# Patient Record
Sex: Female | Born: 1975 | Race: White | Hispanic: No | Marital: Single | Smoking: Never smoker
Health system: Southern US, Community
[De-identification: ages and names within clinical notes are randomized; demographics above are authoritative.]

## PROBLEM LIST (undated history)

## (undated) DIAGNOSIS — T7840XA Allergy, unspecified, initial encounter: Secondary | ICD-10-CM

## (undated) DIAGNOSIS — I82409 Acute embolism and thrombosis of unspecified deep veins of unspecified lower extremity: Secondary | ICD-10-CM

## (undated) DIAGNOSIS — I739 Peripheral vascular disease, unspecified: Secondary | ICD-10-CM

## (undated) DIAGNOSIS — J45909 Unspecified asthma, uncomplicated: Secondary | ICD-10-CM

## (undated) DIAGNOSIS — J302 Other seasonal allergic rhinitis: Secondary | ICD-10-CM

## (undated) DIAGNOSIS — E669 Obesity, unspecified: Secondary | ICD-10-CM

## (undated) HISTORY — DX: Acute embolism and thrombosis of unspecified deep veins of unspecified lower extremity: I82.409

## (undated) HISTORY — PX: FINGER SURGERY: SHX640

## (undated) HISTORY — PX: ANAL FISTULECTOMY: SHX1139

## (undated) HISTORY — DX: Peripheral vascular disease, unspecified: I73.9

## (undated) HISTORY — DX: Allergy, unspecified, initial encounter: T78.40XA

## (undated) HISTORY — DX: Obesity, unspecified: E66.9

## (undated) HISTORY — DX: Other seasonal allergic rhinitis: J30.2

## (undated) HISTORY — DX: Unspecified asthma, uncomplicated: J45.909

---

## 2004-08-24 HISTORY — PX: TREATMENT FISTULA ANAL: SUR1390

## 2013-06-26 ENCOUNTER — Encounter (INDEPENDENT_AMBULATORY_CARE_PROVIDER_SITE_OTHER): Payer: Self-pay | Admitting: Family Medicine

## 2013-06-26 ENCOUNTER — Ambulatory Visit (INDEPENDENT_AMBULATORY_CARE_PROVIDER_SITE_OTHER): Payer: 59 | Admitting: Family Medicine

## 2013-06-26 VITALS — BP 95/79 | HR 76 | Temp 97.8°F | Resp 14 | Ht 60.39 in | Wt 276.0 lb

## 2013-06-26 MED ORDER — ALBUTEROL SULFATE HFA 108 (90 BASE) MCG/ACT IN AERS
1.0000 | INHALATION_SPRAY | Freq: Four times a day (QID) | RESPIRATORY_TRACT | Status: DC | PRN
Start: 2013-06-26 — End: 2014-11-16

## 2013-06-26 MED ORDER — MONTELUKAST SODIUM 10 MG PO TABS
10.0000 mg | ORAL_TABLET | Freq: Every evening | ORAL | Status: AC
Start: 2013-06-26 — End: ?

## 2013-06-26 NOTE — Progress Notes (Signed)
Has the patient sought any care outside of the [facility]?urgent care   Refer to care team .

## 2013-06-26 NOTE — Progress Notes (Signed)
Subjective:       Patient ID: Jakeira Seeman is a 37 y.o. female.    HPI  New to practice.  Here to get established and for the following concern(s):   Has a myriad of 'quick things' to review (mostly Derm related)  Needs Rx med refills (asthma/allergy).   Right ring finger: several months; growth; ? Wart.  No otc tried.  Bothersome.     The following portions of the patient's history were reviewed and updated as appropriate:  current medications, allergies, past family history, past medical history, past social history, past surgical history and problem list.     Review of Systems   Constitutional: Negative.    HENT: Negative.    Respiratory: Negative.    Skin:        See HPI  No other areas involved         BP 95/79  Pulse 76  Temp 97.8 F (36.6 C) (Oral)  Resp 14  Ht 1.534 m (5' 0.39")  Wt 125.193 kg (276 lb)  BMI 53.20 kg/m2  SpO2 98%  LMP 06/26/2013   Objective:    Physical Exam   Vitals reviewed.  Constitutional: She appears well-developed and well-nourished.        obese   Skin:        Right first finger: few mm raised flesh colored papule           Assessment:       1. Asthma without status asthmaticus, mild intermittent, uncomplicated  albuterol (PROAIR HFA) 108 (90 BASE) MCG/ACT inhaler   2. Seasonal allergic rhinitis  montelukast (SINGULAIR) 10 MG tablet   3. Skin lesion  Ambulatory referral to Dermatology          Plan:       Refilled Rx meds as above  See DERM  rto for CPE/FBW/well female exam   Call with updates/questions/concerns.    Madlyn Frankel Levetta Bognar, D.O.

## 2013-07-04 ENCOUNTER — Encounter (INDEPENDENT_AMBULATORY_CARE_PROVIDER_SITE_OTHER): Payer: Self-pay

## 2014-02-15 ENCOUNTER — Encounter (INDEPENDENT_AMBULATORY_CARE_PROVIDER_SITE_OTHER): Payer: Self-pay | Admitting: Family Medicine

## 2014-11-16 ENCOUNTER — Ambulatory Visit (INDEPENDENT_AMBULATORY_CARE_PROVIDER_SITE_OTHER): Payer: 59 | Admitting: Family Medicine

## 2014-11-16 ENCOUNTER — Other Ambulatory Visit: Payer: 59

## 2014-11-16 ENCOUNTER — Encounter (INDEPENDENT_AMBULATORY_CARE_PROVIDER_SITE_OTHER): Payer: Self-pay | Admitting: Family Medicine

## 2014-11-16 VITALS — BP 135/81 | HR 75 | Temp 98.1°F | Resp 24 | Ht 60.43 in | Wt 280.4 lb

## 2014-11-16 DIAGNOSIS — Z833 Family history of diabetes mellitus: Secondary | ICD-10-CM

## 2014-11-16 DIAGNOSIS — Z0000 Encounter for general adult medical examination without abnormal findings: Secondary | ICD-10-CM

## 2014-11-16 DIAGNOSIS — Z1389 Encounter for screening for other disorder: Secondary | ICD-10-CM

## 2014-11-16 DIAGNOSIS — Z1331 Encounter for screening for depression: Secondary | ICD-10-CM

## 2014-11-16 DIAGNOSIS — J4520 Mild intermittent asthma, uncomplicated: Secondary | ICD-10-CM

## 2014-11-16 MED ORDER — ALBUTEROL SULFATE HFA 108 (90 BASE) MCG/ACT IN AERS
1.0000 | INHALATION_SPRAY | Freq: Four times a day (QID) | RESPIRATORY_TRACT | Status: AC | PRN
Start: 2014-11-16 — End: ?

## 2014-11-16 NOTE — Progress Notes (Signed)
Subjective:      Patient ID: Jenna Craig  is a 39 y.o.  female.     Chief Complaint   Patient presents with   . Annual Exam      HPI   Last seen 06/2013.  Here for complete physical examination with fasting blood work.  See ROS for any specific concerns today.      Patient Active Problem List   Diagnosis   . Obesity   . Asthma without status asthmaticus   . Seasonal allergic rhinitis     Past Medical History   Diagnosis Date   . Obesity    . Asthma without status asthmaticus    . Seasonal allergic rhinitis      Past Surgical History   Procedure Laterality Date   . Treatment fistula anal  2006   . Finger surgery       at age 79      Family History   Problem Relation Age of Onset   . Diabetes Mother    . Hypertension Mother    . Cancer Father 16     squamous cell CA   . Clotting disorder Brother    . Cancer Maternal Aunt 53     breast cancer      History     Social History   . Marital Status: Single     Spouse Name: N/A   . Number of Children: N/A   . Years of Education: N/A     Occupational History   . Not on file.     Social History Main Topics   . Smoking status: Never Smoker    . Smokeless tobacco: Not on file   . Alcohol Use: Not on file   . Drug Use: Not on file   . Sexual Activity: Yes     Birth Control/ Protection: Condom     Other Topics Concern   . Not on file     Social History Narrative     Current Outpatient Prescriptions   Medication Sig Dispense Refill   . albuterol (PROAIR HFA) 108 (90 BASE) MCG/ACT inhaler Inhale 1-2 puffs into the lungs every 6 (six) hours as needed. 18 g 5   . ibuprofen (ADVIL,MOTRIN) 800 MG tablet Take 800 mg by mouth every 6 (six) hours as needed.     . montelukast (SINGULAIR) 10 MG tablet Take 1 tablet (10 mg total) by mouth nightly. 30 tablet 11     No current facility-administered medications for this visit.     No Known Allergies      Review of Systems   Constitutional: Negative for fever, fatigue and unexpected weight change.   HENT: Negative for congestion, hearing loss,  sore throat and tinnitus.    Eyes: Negative for redness and visual disturbance.   Respiratory: Negative for cough and shortness of breath.         Hx asthma; rare use albuterol; needs refill   Cardiovascular: Negative for chest pain, palpitations and leg swelling.   Gastrointestinal: Negative for nausea, vomiting, abdominal pain, diarrhea, constipation and blood in stool.   Genitourinary: Negative for dysuria, frequency and difficulty urinating.   Musculoskeletal: Negative for myalgias and arthralgias.        Occas nighttime leg cramps   Skin: Negative for rash.   Neurological: Negative for dizziness, weakness, numbness and headaches.   Hematological: Negative for adenopathy. Does not bruise/bleed easily.   Psychiatric/Behavioral: Negative for sleep disturbance and dysphoric mood. The patient is not  nervous/anxious.          BP 135/81 mmHg  Pulse 75  Temp(Src) 98.1 F (36.7 C) (Oral)  Resp 24  Ht 1.535 m (5' 0.43")  Wt 127.189 kg (280 lb 6.4 oz)  BMI 53.98 kg/m2  SpO2 94%    Objective:   Physical Exam   Constitutional: She appears well-developed and well-nourished.   obese   HENT:   Right Ear: Tympanic membrane and ear canal normal.   Left Ear: Tympanic membrane and ear canal normal.   Nose: Nose normal.   Mouth/Throat: Oropharynx is clear and moist.   Eyes: Conjunctivae and EOM are normal.   Neck: Neck supple. No thyroid mass and no thyromegaly present.   Cardiovascular: Regular rhythm and normal heart sounds.    Few lower leg/ankle varicosities   Pulmonary/Chest: Breath sounds normal.   Abdominal: Soft. Normal appearance and bowel sounds are normal. There is no hepatosplenomegaly. There is no tenderness.   Musculoskeletal:   Normal ROM.  No tenderness, swelling.    Lymphadenopathy:     She has no cervical adenopathy.   Neurological: She has normal strength. No cranial nerve deficit or sensory deficit.   Skin: No rash noted.   Psychiatric: She has a normal mood and affect.        Assessment:     1. Routine  general medical examination at a health care facility  CBC without differential    Comprehensive metabolic panel    Lipid panel    TSH    Vitamin D,25 OH, Total   2. Asthma without status asthmaticus, mild intermittent, uncomplicated  albuterol (PROAIR HFA) 108 (90 BASE) MCG/ACT inhaler   3. Depression screening     4. Family history of diabetes mellitus (DM)  Hemoglobin A1C   5. Morbid obesity due to excess calories           Plan:   Fasting blood work as above.  DDS up to date; Ophtho due.  Sees DERM regularly.  Sees GYN; utd    Body mass index is 53.98 kg/(m^2).   Discussed the patient's BMI with her.    The BMI is above average; BMI management plan is completed.    Nutrition Counseling: Low cholesterol diet education  Physical Activity Counseling: Patient advised about exercise     Counseling/Anticipatory Guidance (40-64): Nutrition, physical activity, healthy weight, injury prevention, misuse of tobacco, alcohol and drugs, sexual behavior and STDs, contraception, dental health, mental health, immunizations, screenings.      Call with updates/questions/concerns.    Madlyn Frankel Fawn Desrocher, D.O.

## 2014-11-16 NOTE — Progress Notes (Signed)
Has the patient sought any care outside of the [facility]? Yes - Dermatologist; [facility] (Lumbar Ridiculopathy)  Refer to Care Team? No    Pap Smear due. Pt notified.

## 2014-11-22 LAB — COMPREHENSIVE METABOLIC PANEL
ALT: 25 IU/L (ref 0–32)
AST (SGOT): 16 IU/L (ref 0–40)
Albumin/Globulin Ratio: 1.6 (ref 1.1–2.5)
Albumin: 4.3 g/dL (ref 3.5–5.5)
Alkaline Phosphatase: 49 IU/L (ref 39–117)
BUN / Creatinine Ratio: 19 (ref 9–23)
BUN: 13 mg/dL (ref 6–24)
Bilirubin, Total: 0.4 mg/dL (ref 0.0–1.2)
CO2: 26 mmol/L (ref 18–29)
Calcium: 9.1 mg/dL (ref 8.7–10.2)
Chloride: 99 mmol/L (ref 97–108)
Creatinine: 0.68 mg/dL (ref 0.57–1.00)
EGFR: 109 mL/min/{1.73_m2} (ref >59)
EGFR: 126 mL/min/{1.73_m2} (ref >59)
Globulin, Total: 2.7 g/dL (ref 1.5–4.5)
Glucose: 112 mg/dL — ABNORMAL HIGH (ref 65–99)
Potassium: 4.1 mmol/L (ref 3.5–5.2)
Protein, Total: 7 g/dL (ref 6.0–8.5)
Sodium: 139 mmol/L (ref 134–144)

## 2014-11-22 LAB — LIPID PANEL
Cholesterol / HDL Ratio: 4.3 ratio units (ref 0.0–4.4)
Cholesterol: 175 mg/dL (ref 100–199)
HDL: 41 mg/dL (ref >39)
LDL Calculated: 119 mg/dL — ABNORMAL HIGH (ref 0–99)
Triglycerides: 74 mg/dL (ref 0–149)
VLDL Calculated: 15 mg/dL (ref 5–40)

## 2014-11-22 LAB — CBC

## 2014-11-22 LAB — TSH: TSH: 3.63 u[IU]/mL (ref 0.450–4.500)

## 2014-11-23 LAB — VITAMIN D,25 OH,TOTAL: Vitamin D 25-Hydroxy: 15.9 ng/mL — ABNORMAL LOW (ref 30.0–100.0)

## 2014-11-24 LAB — HEMOGLOBIN A1C: Hemoglobin A1C: 6.4 % — AB (ref 4.0–6.0)

## 2014-11-27 ENCOUNTER — Other Ambulatory Visit (INDEPENDENT_AMBULATORY_CARE_PROVIDER_SITE_OTHER): Payer: Self-pay | Admitting: Family Medicine

## 2014-11-27 DIAGNOSIS — R7303 Prediabetes: Secondary | ICD-10-CM | POA: Insufficient documentation

## 2014-11-27 DIAGNOSIS — R748 Abnormal levels of other serum enzymes: Secondary | ICD-10-CM | POA: Insufficient documentation

## 2014-11-27 DIAGNOSIS — E559 Vitamin D deficiency, unspecified: Secondary | ICD-10-CM | POA: Insufficient documentation

## 2014-11-27 NOTE — Progress Notes (Signed)
Quick Note:    Call pt. We finally received all lab results.  Sugars are elevated/borderline diabetes.  Good cholesterol is low.   Vitamin D is low.   Start otc vit D supplement 2000 units/day. Work on diet/exercise/weight loss. May need Rx med for sugars.   Recheck FBW in 4 months.         ______

## 2014-11-29 ENCOUNTER — Encounter (INDEPENDENT_AMBULATORY_CARE_PROVIDER_SITE_OTHER): Payer: Self-pay

## 2014-11-29 NOTE — Progress Notes (Signed)
As per Dr. Franklyn Lor. Enter/Edit the A1C to his original order.

## 2014-12-28 ENCOUNTER — Telehealth (INDEPENDENT_AMBULATORY_CARE_PROVIDER_SITE_OTHER): Payer: Self-pay

## 2014-12-28 NOTE — Telephone Encounter (Signed)
Pt LM on Nurse's VM worried she was experiencing symptoms of a heart attack.    Are any of the following symptoms present?     -Continuous or intermittent pain, tightness, pressure or discomfort accompanied by:   -SOB  -Dizziness, weakness, passed out or feels about to pass out   -Moist skin (sweating)   -Nausea or vomiting   -Pain in the neck, shoulders, jaw, back or arms   -Blue or gray face, lips, earlobes, or fingernails   -Heart palpitation     -Chest pain persists, unrelieved by rest, pain medication, antacids, or nitroglycerin every 5 minutes for 2-3 doses?     If yes, call ambulance and after calling 911 chew one adult aspirin (325mg ) unless allergic to aspirin     Are any of the following symptoms present?     -Change in chest pain in known cardiac patient   -Intermittent chest pain increasing in severity or frequency   -Chest pain at rest or that awakens person  -Chest pain after recent period of prolonged sitting, bed rest, or travel   -Strong family history of heart disease, heart attack, stroke, or DM   -History of DM, heart disease, CHF, of blood clotting problem (DVT, PE)   -Age >30 and heavy smoker with high blood pressure, high cholesterol or obesity   -Pain, swelling, warmth, or redness of leg   -Sudden onset of swollen ankles   -Coughing up blood   -Fever, cough, congestion and SOB  -Trauma, fracture childbirth or surgery in past month   -History of blood clotting problems   -Recreational street drug or prescription drug abuse within past 24 hours   -Heart palpitations   -Repeated shocks and internal defibrillator in place.     If yes, seek emergency care now. Do not drive yourself. If another driver is not available, call an ambulance.     If no, are any of the following symptoms present?     -Localized area of painful blisters or rash   -Recent injury and pain increases with movement   -Pain occurs when pressure is applied to the area   -Intermittent mild chest discomfort with productive  cough  -Fever >100.4  All other patient with chest pain??    If yes see patient in the office that day or advice the patient to go to UC.     Called pt back and asked her the questions above. She stated that she had Right arm pain yesterday from her elbow to her shoulder. That pain went away, but then today she has a sharp pain through her Right breast to her back. The pain lasted approx 3 hours and she advised she took advil and the pain has subsided. The only symptom she had was the sharp pain through her Right breast, which is now gone. I suggested that if her pain comes back during the weekend, she could visit an [place] [facility]. Or if it comes back next week, to call our office and make an OV for evaluation. Pt verbalized understanding and had no further questions.

## 2015-03-29 ENCOUNTER — Encounter (INDEPENDENT_AMBULATORY_CARE_PROVIDER_SITE_OTHER): Payer: Self-pay | Admitting: Family Medicine

## 2015-03-29 DIAGNOSIS — E559 Vitamin D deficiency, unspecified: Secondary | ICD-10-CM

## 2015-03-29 DIAGNOSIS — R7303 Prediabetes: Secondary | ICD-10-CM

## 2016-12-28 ENCOUNTER — Encounter: Payer: Self-pay | Admitting: Obstetrics & Gynecology

## 2017-02-16 ENCOUNTER — Ambulatory Visit: Payer: Self-pay | Admitting: *Deleted

## 2017-02-16 ENCOUNTER — Encounter: Payer: Self-pay | Admitting: *Deleted

## 2017-02-16 VITALS — BP 118/86 | Ht 61.0 in | Wt 283.0 lb

## 2017-02-16 DIAGNOSIS — Z0000 Encounter for general adult medical examination without abnormal findings: Secondary | ICD-10-CM

## 2017-02-16 NOTE — Progress Notes (Signed)
[employer] insurance premium discount evaluation: Labs Drawn. Replacements ROI form signed. Tobacco Free Attestation form signed.  Forms placed in paper chart.  

## 2017-02-17 LAB — CMP12+LP+TP+TSH+6AC+CBC/D/PLT
A/G RATIO: 1.3 (ref 1.2–2.2)
ALT: 20 IU/L (ref 0–32)
AST: 11 IU/L (ref 0–40)
Albumin: 4 g/dL (ref 3.5–5.5)
Alkaline Phosphatase: 52 IU/L (ref 39–117)
BASOS: 0 %
BUN / CREAT RATIO: 19 (ref 9–23)
BUN: 14 mg/dL (ref 6–24)
Basophils Absolute: 0 10*3/uL (ref 0.0–0.2)
Bilirubin Total: 0.3 mg/dL (ref 0.0–1.2)
CHLORIDE: 103 mmol/L (ref 96–106)
CHOL/HDL RATIO: 3.4 ratio (ref 0.0–4.4)
CREATININE: 0.73 mg/dL (ref 0.57–1.00)
Calcium: 9.1 mg/dL (ref 8.7–10.2)
Cholesterol, Total: 154 mg/dL (ref 100–199)
EOS (ABSOLUTE): 0.3 10*3/uL (ref 0.0–0.4)
EOS: 4 %
Estimated CHD Risk: 0.5 times avg. (ref 0.0–1.0)
Free Thyroxine Index: 1.8 (ref 1.2–4.9)
GFR, EST AFRICAN AMERICAN: 117 mL/min/{1.73_m2} (ref >59)
GFR, EST NON AFRICAN AMERICAN: 101 mL/min/{1.73_m2} (ref >59)
GGT: 18 IU/L (ref 0–60)
GLUCOSE: 123 mg/dL — AB (ref 65–99)
Globulin, Total: 3 g/dL (ref 1.5–4.5)
HDL: 45 mg/dL (ref >39)
HEMOGLOBIN: 13.5 g/dL (ref 11.1–15.9)
Hematocrit: 41.2 % (ref 34.0–46.6)
Immature Grans (Abs): 0 10*3/uL (ref 0.0–0.1)
Immature Granulocytes: 0 %
Iron: 58 ug/dL (ref 27–159)
LDH: 162 IU/L (ref 119–226)
LDL Calculated: 97 mg/dL (ref 0–99)
Lymphocytes Absolute: 2.3 10*3/uL (ref 0.7–3.1)
Lymphs: 28 %
MCH: 28.8 pg (ref 26.6–33.0)
MCHC: 32.8 g/dL (ref 31.5–35.7)
MCV: 88 fL (ref 79–97)
MONOCYTES: 9 %
Monocytes Absolute: 0.7 10*3/uL (ref 0.1–0.9)
NEUTROS ABS: 4.7 10*3/uL (ref 1.4–7.0)
Neutrophils: 59 %
PHOSPHORUS: 3.5 mg/dL (ref 2.5–4.5)
POTASSIUM: 4.7 mmol/L (ref 3.5–5.2)
Platelets: 214 10*3/uL (ref 150–379)
RBC: 4.68 x10E6/uL (ref 3.77–5.28)
RDW: 13.9 % (ref 12.3–15.4)
Sodium: 141 mmol/L (ref 134–144)
T3 Uptake Ratio: 26 % (ref 24–39)
T4, Total: 6.8 ug/dL (ref 4.5–12.0)
TSH: 3.77 u[IU]/mL (ref 0.450–4.500)
Total Protein: 7 g/dL (ref 6.0–8.5)
Triglycerides: 60 mg/dL (ref 0–149)
URIC ACID: 5.6 mg/dL (ref 2.5–7.1)
VLDL CHOLESTEROL CAL: 12 mg/dL (ref 5–40)
WBC: 8 10*3/uL (ref 3.4–10.8)

## 2017-02-17 LAB — HGB A1C W/O EAG: HEMOGLOBIN A1C: 6.6 % — AB (ref 4.8–5.6)

## 2017-02-18 NOTE — Progress Notes (Signed)
Pt off work today. Unable to review labs.  New employee. No previous labs available. Recently moved to area. No CareEverywhere available. A1c elevated. Pt denied medical Hx other than seasonal allergies and rectal fistula repair. Handouts prepared for prediabetes, diabetic diet recommendations and local PCP accepting new pts. Will review will pt upon RN return in 2 weeks, or NP in clinic next week if pt available.

## 2017-02-23 ENCOUNTER — Encounter: Payer: Self-pay | Admitting: Obstetrics & Gynecology

## 2017-03-02 NOTE — Progress Notes (Signed)
Pt's phone extension reports "at home" 6/28, 7/9, and 7/10 when RN has attempted result review. Will attempt contact again Thursday upon RN return.

## 2017-03-05 NOTE — Progress Notes (Signed)
Results reviewed with pt. A1c and glucose elevated. She reports having been told in the past that was prediabetic. Diet worsened since moving to [place], she reports due to change in schedule and habits. Sts she does not exercise and struggles to drink more than 32oz of water. Sts mother is diabetic. Reviewed diet and exercise recommendations r/t diabetes at length, and gave handouts from [facility] on carb counting, the plate method and general prediabetic/diabetes information. Other labs unremarkable. Pt also given list of local PCPs accepting new pts to establish care with. Will inform RN once care is established in order to send records to new provider. Copy of labs provided to pt. She denies further questions or concerns.

## 2017-06-15 ENCOUNTER — Encounter: Payer: Self-pay | Admitting: Registered Nurse

## 2017-06-15 ENCOUNTER — Ambulatory Visit: Payer: Self-pay | Admitting: Registered Nurse

## 2017-06-15 VITALS — BP 144/98 | HR 84 | Temp 98.5°F | Resp 16

## 2017-06-15 DIAGNOSIS — W57XXXA Bitten or stung by nonvenomous insect and other nonvenomous arthropods, initial encounter: Secondary | ICD-10-CM

## 2017-06-15 DIAGNOSIS — R300 Dysuria: Secondary | ICD-10-CM

## 2017-06-15 LAB — POCT URINALYSIS DIPSTICK
Bilirubin, UA: NEGATIVE
Blood, UA: NEGATIVE
Glucose, UA: NEGATIVE
KETONES UA: NEGATIVE
Leukocytes, UA: NEGATIVE
Nitrite, UA: NEGATIVE
PH UA: 6.5 (ref 5.0–8.0)
PROTEIN UA: NEGATIVE
SPEC GRAV UA: 1.02 (ref 1.010–1.025)
Urobilinogen, UA: 0.2 E.U./dL

## 2017-06-15 MED ORDER — HYDROCORTISONE 1 % EX LOTN: 1.0000 "application " | TOPICAL_LOTION | Freq: Two times a day (BID) | CUTANEOUS | 0 refills | Status: AC | PRN

## 2017-06-15 MED ORDER — TRIPLE ANTIBIOTIC 5-400-5000 EX OINT: TOPICAL_OINTMENT | Freq: Two times a day (BID) | CUTANEOUS | 0 refills | Status: AC | PRN

## 2017-06-15 NOTE — Progress Notes (Signed)
Subjective:    Patient ID: Danielle Cooke, female    DOB: 07/09/1976, 41 y.o.   MRN: [id number]  41y/o caucasian female new patient c/o bug bites left thigh.  Initially red/swollen looked like 3 spots close together.  Denied drainage, fever, itching.   Also c/o uti sx, urgency, frequency, very small amount of urine output x3 days. Denied hematuria, cloudiness, smell, changes in color.  Typically feels better on her period.  Patient stopped drinking coca cola every day after getting her lab results [facility] and elevated blood sugar down to one soda per week now and trying to lose weight.  Has been drinking more milk typically does not drink water  PMHx chronic low back pain denied loss of bowel/bladder control, saddle paresthesias, arm/leg weakness.  Having low back pain today about her usual maybe a little worse.  Had some diarrhea in the past 2 days I think I ate something that did not agree with me      Review of Systems  Constitutional: Negative for activity change, appetite change, chills, diaphoresis, fatigue, fever and unexpected weight change.  HENT: Negative for trouble swallowing and voice change.   Eyes: Negative for photophobia and visual disturbance.  Respiratory: Negative for shortness of breath and wheezing.   Cardiovascular: Negative for palpitations and leg swelling.  Gastrointestinal: Positive for abdominal pain and diarrhea. Negative for abdominal distention, blood in stool, constipation, nausea and vomiting.  Endocrine: Negative for cold intolerance and heat intolerance.  Genitourinary: Positive for decreased urine volume, dysuria and urgency. Negative for hematuria and menstrual problem.  Musculoskeletal: Positive for back pain. Negative for gait problem, joint swelling, myalgias, neck pain and neck stiffness.  Skin: Positive for color change and rash. Negative for pallor and wound.  Allergic/Immunologic: Negative for food allergies.  Neurological: Negative for tremors,  seizures, syncope, facial asymmetry, speech difficulty, weakness, light-headedness, numbness and headaches.  Hematological: Negative for adenopathy. Does not bruise/bleed easily.  Psychiatric/Behavioral: Negative for agitation, confusion and sleep disturbance.       Objective:   Physical Exam  Constitutional: She is oriented to person, place, and time. She appears well-developed and well-nourished. She is active and cooperative.  Non-toxic appearance. She does not have a sickly appearance. She does not appear ill. No distress.  HENT:  Head: Normocephalic and atraumatic.  Right Ear: Hearing, external ear and ear canal normal. A middle ear effusion is present.  Left Ear: Hearing, external ear and ear canal normal. A middle ear effusion is present.  Nose: Nose normal. No mucosal edema, rhinorrhea, nose lacerations, sinus tenderness, nasal deformity, septal deviation or nasal septal hematoma. No epistaxis.  No foreign bodies. Right sinus exhibits no maxillary sinus tenderness and no frontal sinus tenderness. Left sinus exhibits no maxillary sinus tenderness and no frontal sinus tenderness.  Mouth/Throat: Uvula is midline. Mucous membranes are not pale, dry and not cyanotic. She does not have dentures. No oral lesions. No trismus in the jaw. Normal dentition. No dental abscesses, uvula swelling, lacerations or dental caries. No oropharyngeal exudate, posterior oropharyngeal edema, posterior oropharyngeal erythema or tonsillar abscesses.  Bilateral TMs air fluid level clear; saliva tacky/slight coating tongue  Eyes: Pupils are equal, round, and reactive to light. Conjunctivae, EOM and lids are normal. Right eye exhibits no chemosis, no discharge, no exudate and no hordeolum. No foreign body present in the right eye. Left eye exhibits no chemosis, no discharge, no exudate and no hordeolum. No foreign body present in the left eye. Right conjunctiva is not  injected. Right conjunctiva has no hemorrhage. Left  conjunctiva is not injected. Left conjunctiva has no hemorrhage. No scleral icterus. Right eye exhibits normal extraocular motion and no nystagmus. Left eye exhibits normal extraocular motion and no nystagmus. Right pupil is round and reactive. Left pupil is round and reactive. Pupils are equal.  Neck: Trachea normal, normal range of motion and phonation normal. Neck supple. No tracheal tenderness, no spinous process tenderness and no muscular tenderness present. No neck rigidity. No tracheal deviation, no edema, no erythema and normal range of motion present. No thyroid mass and no thyromegaly present.  Cardiovascular: Normal rate, regular rhythm, S1 normal, S2 normal, normal heart sounds and intact distal pulses.  PMI is not displaced.  Exam reveals no gallop and no friction rub.   No murmur heard. Pulses:      Radial pulses are 2+ on the right side, and 2+ on the left side.  Pulmonary/Chest: Effort normal and breath sounds normal. No accessory muscle usage or stridor. No respiratory distress. She has no decreased breath sounds. She has no wheezes. She has no rhonchi. She has no rales. She exhibits no tenderness.  No cough observed in exam room; spoke full sentences without difficulty  Abdominal: Soft. Normal appearance. She exhibits no shifting dullness, no distension, no pulsatile liver, no fluid wave, no abdominal bruit, no ascites, no pulsatile midline mass and no mass. Bowel sounds are decreased. There is tenderness in the right lower quadrant, suprapubic area and left lower quadrant. There is no rigidity, no rebound, no guarding, no CVA tenderness, no tenderness at McBurney's point and negative Murphy's sign. Hernia confirmed negative in the ventral area.  Dull to percussion x 4 quads; hypoactive bowel sounds x 4 quads; negative CVA tenderness bilaterally  Musculoskeletal: Normal range of motion. She exhibits no edema or tenderness.       Right shoulder: Normal.       Left shoulder: Normal.        Right elbow: Normal.      Left elbow: Normal.       Right hip: Normal.       Left hip: Normal.       Right knee: Normal.       Left knee: Normal.       Cervical back: Normal.       Thoracic back: Normal.       Back:       Right hand: Normal.       Left hand: Normal.  Midline low back pain with lying to sitting and standing; slow from lying to sitting did not require assistance; in/out of chair without difficulty; gait sure and steady in hall  Lymphadenopathy:       Head (right side): No submental, no submandibular, no tonsillar, no preauricular, no posterior auricular and no occipital adenopathy present.       Head (left side): No submental, no submandibular, no tonsillar, no preauricular, no posterior auricular and no occipital adenopathy present.    She has no cervical adenopathy.       Right cervical: No superficial cervical, no deep cervical and no posterior cervical adenopathy present.      Left cervical: No superficial cervical, no deep cervical and no posterior cervical adenopathy present.  Neurological: She is alert and oriented to person, place, and time. She has normal strength. She is not disoriented. She displays no atrophy and no tremor. No cranial nerve deficit or sensory deficit. She exhibits normal muscle tone. She displays no seizure  activity. Coordination and gait normal. GCS eye subscore is 4. GCS verbal subscore is 5. GCS motor subscore is 6.  Extremity strength equal bilaterally 5/5  Skin: Skin is warm, dry and intact. Rash noted. No abrasion, no bruising, no burn, no ecchymosis, no laceration, no lesion, no petechiae and no purpura noted. Rash is macular, papular and maculopapular. Rash is not nodular, not pustular, not vesicular and not urticarial. She is not diaphoretic. No cyanosis or erythema. No pallor. Nails show no clubbing.     3 papules grouped left upper anterior thigh less than 1 cm diameter; faint macular erythema and fine scale/hyperpigmentation noted not  TTP; area dry  Psychiatric: She has a normal mood and affect. Her speech is normal and behavior is normal. Judgment and thought content normal. Cognition and memory are normal.  Nursing note and vitals reviewed.         Assessment & Plan:  A-insect bite initial encounter no sequela; dysuria  P-May apply triple antibiotic UD 3 given to patient from clinic stock topical BID affected area until healed.  If itching may apply hydrocortisone lotion 1% BID x 7 days 2 UD given to patient from clinic stock. Avoid scratching.  Discussed calamine lotion or topical benadryl or ice also helpful for itching. Symptomatic therapy suggested.  Call or return to clinic as needed if these symptoms worsen or fail to improve as anticipated.  Go to ER if difficulty breathing, swallowing, dizziness or chest pain for immediate evaluation and treatment follow up with [facility] if ER visit required.   Discussed signs/symptoms infection e.g. Fever, chills, worsening redness, swelling and/or discharge.  Exitcare handout on insect bite/sting, cellulitis given to patient.  Patient verbalized agreement and understanding of treatment plan and had no further questions at this time.   P2:  Avoidance and hand washing.  Patient is also to push fluids alternate milk and water to improve hydration status.  Drink full glass of water with biotin supplement.   Hydrate, avoid dehydration.  Avoid holding urine, void on frequent basis every 4 to 6 hours.  If unable to void every 8 hours, hematuria, nausea, vomiting, tea/cola colored urine follow up for re-evaluation with PCM, urgent care or ER.   Call or return to clinic as needed if these symptoms worsen or fail to improve as anticipated. Patient to return to clinic if still dysuria after increasing hydration for repeat ua in 48 hours.  Discussed mechanical irritation, foods/vitamins/supplements, infection, kidney stones, crystals in urine, sugar in urine could cause dysuria/pain.  Urinalysis today  WNL and patient notified at appt verbally and voiced understanding of information.  Exitcare handout on dysuria given to patient Patient verbalized agreement and understanding of treatment plan and had no further questions at this time. P2:  Hydrate and cranberry juice

## 2017-06-15 NOTE — Patient Instructions (Signed)
Insect Bite, Adult An insect bite can make your skin red, itchy, and swollen. An insect bite is different from an insect sting, which happens when an insect injects poison (venom) into the skin. Some insects can spread disease to people through a bite. However, most insect bites do not lead to disease and are not serious. What are the causes? Insects may bite for a variety of reasons, including:  Hunger.  To defend themselves.  Insects that bite include:  Spiders.  Mosquitoes.  Ticks.  Fleas.  Ants.  Flies.  Bedbugs.  What are the signs or symptoms? Symptoms of this condition include:  Itching or pain in the bite area.  Redness and swelling in the bite area.  An open wound (skin ulcer).  In many cases, symptoms last for 2-4 days. How is this diagnosed? This condition is usually diagnosed based on symptoms and a physical exam. How is this treated? Treatment is usually not needed. Symptoms often go away on their own. When treatment is recommended, it may involve:  Applying a cream or lotion to the bitten area. This treatment helps with itching.  Taking an antibiotic medicine. This treatment is needed if the bite area gets infected.  Getting a tetanus shot.  Applying ice to the affected area.  Medicines called antihistamines. This treatment is needed if you develop an allergic reaction to the insect bite.  Follow these instructions at home: Bite area care  Do not scratch the bite area.  Keep the bite area clean and dry. Wash it every day with soap and water as told by your health care provider.  Check the bite area every day for signs of infection. Check for: ? More redness, swelling, or pain. ? Fluid or blood. ? Warmth. ? Pus. Managing pain, itching, and swelling   You may apply a baking soda paste, cortisone cream, or calamine lotion to the bite area as told by your health care provider.  If directed, applyice to the bite area. ? Put ice in a  plastic bag. ? Place a towel between your skin and the bag. ? Leave the ice on for 20 minutes, 2-3 times per day. Medicines  Apply or take over-the-counter and prescription medicines only as told by your health care provider.  If you were prescribed an antibiotic medicine, use it as told by your health care provider. Do not stop using the antibiotic even if your condition improves. General instructions  Keep all follow-up visits as told by your health care provider. This is important. How is this prevented? To help reduce your risk of insect bites:  When you are outdoors, wear clothing that covers your arms and legs.  Use insect repellent. The best insect repellents contain: ? DEET, picaridin, oil of lemon eucalyptus (OLE), or IR3535. ? Higher amounts of an active ingredient.  If your home windows do not have screens, consider installing them.  Contact a health care provider if:  You have more redness, swelling, or pain in the bite area.  You have fluid, blood, or pus coming from the bite area.  The bite area feels warm to the touch.  You have a fever. Get help right away if:  You have joint pain.  You have a rash.  You have shortness of breath.  You feel unusually tired or sleepy.  You have neck pain.  You have a headache.  You have unusual weakness.  You have chest pain.  You have nausea, vomiting, or pain in the abdomen. This  information is not intended to replace advice given to you by your health care provider. Make sure you discuss any questions you have with your health care provider. Document Released: 09/17/2004 Document Revised: 04/08/2016 Document Reviewed: 02/17/2016 Elsevier Interactive Patient Education  2018 [employer] Dysuria Dysuria is pain or discomfort while urinating. The pain or discomfort may be felt in the tube that carries urine out of the bladder (urethra) or in the surrounding tissue of the genitals. The pain may also be felt in the  groin area, lower abdomen, and lower back. You may have to urinate frequently or have the sudden feeling that you have to urinate (urgency). Dysuria can affect both men and women, but is more common in women. Dysuria can be caused by many different things, including:  Urinary tract infection in women.  Infection of the kidney or bladder.  Kidney stones or bladder stones.  Certain sexually transmitted infections (STIs), such as chlamydia.  Dehydration.  Inflammation of the vagina.  Use of certain medicines.  Use of certain soaps or scented products that cause irritation.  Follow these instructions at home: Watch your dysuria for any changes. The following actions may help to reduce any discomfort you are feeling:  Drink enough fluid to keep your urine clear or pale yellow.  Empty your bladder often. Avoid holding urine for long periods of time.  After a bowel movement or urination, women should cleanse from front to back, using each tissue only once.  Empty your bladder after sexual intercourse.  Take medicines only as directed by your health care provider.  If you were prescribed an antibiotic medicine, finish it all even if you start to feel better.  Avoid caffeine, tea, and alcohol. They can irritate the bladder and make dysuria worse. In men, alcohol may irritate the prostate.  Keep all follow-up visits as directed by your health care provider. This is important.  If you had any tests done to find the cause of dysuria, it is your responsibility to obtain your test results. Ask the lab or department performing the test when and how you will get your results. Talk with your health care provider if you have any questions about your results.  Contact a health care provider if:  You develop pain in your back or sides.  You have a fever.  You have nausea or vomiting.  You have blood in your urine.  You are not urinating as often as you usually do. Get help right away  if:  You pain is severe and not relieved with medicines.  You are unable to hold down any fluids.  You or someone else notices a change in your mental function.  You have a rapid heartbeat at rest.  You have shaking or chills.  You feel extremely weak. This information is not intended to replace advice given to you by your health care provider. Make sure you discuss any questions you have with your health care provider. Document Released: 05/08/2004 Document Revised: 01/16/2016 Document Reviewed: 04/05/2014 Elsevier Interactive Patient Education  [employer]2018 [employer] Inc.

## 2017-10-26 ENCOUNTER — Ambulatory Visit: Payer: Self-pay | Admitting: Registered Nurse

## 2017-10-26 VITALS — BP 129/97 | HR 74 | Temp 98.3°F

## 2017-10-26 DIAGNOSIS — R030 Elevated blood-pressure reading, without diagnosis of hypertension: Secondary | ICD-10-CM

## 2017-10-26 DIAGNOSIS — G4762 Sleep related leg cramps: Secondary | ICD-10-CM

## 2017-10-26 DIAGNOSIS — I83891 Varicose veins of right lower extremities with other complications: Secondary | ICD-10-CM

## 2017-10-26 MED ORDER — ACETAMINOPHEN 500 MG PO TABS: 1000.0000 mg | ORAL_TABLET | Freq: Four times a day (QID) | ORAL | 0 refills | Status: AC | PRN

## 2017-10-26 NOTE — Progress Notes (Signed)
Subjective:    Patient ID: Danielle Cooke, female    DOB: [birth year], 42 y.o.   MRN: [id number]  42y/o caucasian female established Pt c/o R medial thigh pain since 10/22/17. Saw RN Nance Pew that day. Pt was concerned for DVT due to family history.  Brother diagnosed age 56.  Denied calf pain, erythema and warmth noted. Possible muscle strain. RN suggested RICE. Pain worsened over weekend. Pt reports she tried ice to site last night for first time and pain was immediately improved.  Yesterday wore long undergarments did not help.  Had not worn those leggings in a long time wondered if there was insect in long undergarments that bit her but couldn't find bite mark anywhere.  Today pain still improved. States she has noted two "knots" to medial knee and distal medial thigh, now with swelling and redness that has worsened since yesterday.  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  Ibuprofen po prn.  PMHx elevated blood sugar last HgbA1c 6.6 2018, obesity, varicose veins, glasses  Typically does not wear compression socks  Does not have a PCM since moving to [place] from [place]      Review of Systems  Constitutional: Negative for activity change, appetite change, chills, diaphoresis, fatigue, fever and unexpected weight change.  HENT: Negative for congestion, ear pain, sore throat, trouble swallowing and voice change.   Eyes: Negative for photophobia, pain, discharge and visual disturbance.  Respiratory: Negative for cough and wheezing.   Cardiovascular: Negative for chest pain and leg swelling.  Gastrointestinal: Negative for abdominal pain, blood in stool, constipation, diarrhea, nausea and vomiting.  Endocrine: Negative for cold intolerance and heat intolerance.  Genitourinary: Negative for difficulty urinating, dysuria and hematuria.   Musculoskeletal: Positive for myalgias. Negative for arthralgias, back pain, gait problem, joint swelling, neck pain and neck stiffness.  Skin: Positive for color change and rash. Negative for pallor and wound.  Allergic/Immunologic: Negative for environmental allergies and food allergies.  Neurological: Negative for dizziness, tremors, seizures, syncope, facial asymmetry, speech difficulty, weakness, light-headedness, numbness and headaches.  Hematological: Negative for adenopathy. Does not bruise/bleed easily.  Psychiatric/Behavioral: Negative for agitation, confusion and sleep disturbance. The patient is not nervous/anxious.        Objective:   Physical Exam  Constitutional: She is oriented to person, place, and time. She appears well-developed and well-nourished. She is active and cooperative.  Non-toxic appearance. She does not have a sickly appearance. She does not appear ill. No distress.  HENT:  Head: Normocephalic and atraumatic.  Right Ear: Hearing and external ear normal.  Left Ear: Hearing and external ear normal.  Nose: Nose normal.  Mouth/Throat: Uvula is midline, oropharynx is clear and moist and mucous membranes are normal. No oropharyngeal exudate.  Eyes: Conjunctivae, EOM and lids are normal. Pupils are equal, round, and reactive to light. Right eye exhibits no discharge. Left eye exhibits no discharge. No scleral icterus.  Neck: Trachea normal, normal range of motion and phonation normal. Neck supple. No muscular tenderness present. No neck rigidity. No tracheal deviation, no edema, no erythema and normal range of motion present. No thyromegaly present.  Cardiovascular: Normal rate, regular rhythm, normal heart sounds and intact distal pulses.  Pulses:      Radial pulses are 2+ on the right side, and 2+ on the left side.  Pulmonary/Chest: Effort normal and breath sounds normal.  No accessory muscle usage or stridor. No respiratory distress. She has no decreased breath  sounds. She has no wheezes. She has no rhonchi. She has no rales. She exhibits no tenderness.  Abdominal: Soft. Normal appearance. She exhibits no distension, no fluid wave and no ascites. There is no rigidity and no guarding.  Musculoskeletal: Normal range of motion. She exhibits edema and tenderness. She exhibits no deformity.       Right shoulder: Normal.       Left shoulder: Normal.       Right elbow: Normal.      Left elbow: Normal.       Right hip: Normal.       Left hip: Normal.       Right knee: Normal.       Left knee: Normal.       Right ankle: Normal.       Left ankle: Normal.       Cervical back: Normal.       Thoracic back: Normal.       Lumbar back: Normal.       Right forearm: Normal.       Left forearm: Normal.       Right hand: Normal.       Left hand: Normal.       Right upper leg: She exhibits tenderness and swelling. She exhibits no bony tenderness, no edema, no deformity and no laceration.       Left upper leg: Normal.       Right lower leg: Normal.       Left lower leg: Normal.       Legs: Mild macular erythema and linear localized swelling medial anterior right distal thigh 1cm x 4cm superior to knee 4 inches; TTP, no fluctuance, firm, nonmobile  Lymphadenopathy:       Head (right side): No submental, no submandibular, no preauricular, no posterior auricular and no occipital adenopathy present.       Head (left side): No submental, no submandibular, no preauricular, no posterior auricular and no occipital adenopathy present.    She has no cervical adenopathy.  Neurological: She is alert and oriented to person, place, and time. She has normal strength. She is not disoriented. She displays no atrophy and no tremor. No cranial nerve deficit or sensory deficit. She exhibits normal muscle tone. She displays no seizure activity. Coordination and gait normal. GCS eye subscore is 4. GCS verbal subscore is 5. GCS motor subscore is 6.  Bilateral extremity strength 5/5  equal; gait sure and steady and smooth/ on/off exam table/in/out of chair without difficulty  Skin: Skin is warm, dry and intact. Rash noted. No abrasion, no bruising, no burn, no ecchymosis, no laceration, no lesion, no petechiae and no purpura noted. Rash is macular. Rash is not papular, not maculopapular, not nodular, not pustular, not vesicular and not urticarial. She is not diaphoretic. There is erythema. No cyanosis. No pallor. Nails show no clubbing.     Psychiatric: She has a normal mood and affect. Her speech is normal and behavior is normal. Judgment and thought content normal. Cognition and memory are normal.     Patient requested US to rule out DVT as family history older brother.  Discussed locations she [facility] [place] [facility] and RN Rolly SalterHaley to assist with patient directions to facility and confirm time with facility staff.  RN Rolly SalterHaley contacted facility and preauthorization required, patient instructed to call POC number given to her by RN  Rolly Salter today to schedule at [facility].       Assessment & Plan:  A-right lower leg varicose veins and swelling, nocturnal leg cramps bilateral, elevated blood pressure, obesity  P-Discussed compression tights/leggings, elevating legs when sitting, tylenol 1000mg  po QID prn pain, cryotherapy 15 minutes QID  Will call her at preferred number [phone] her cell when results available.  DDx varicose vein infections, phlebitis, cellulitis, muscle strain  Patient to notify me if red streaks, worsening swelling, dyspnea/SOB, chest pain with plan of care. ER if difficulty breathing, chest pain, worst headache of life and/or visual changes. Patient does not exercise. Avoid strenuous activity until cleared by [place] not to have DVT.   Discussed may take baby aspirin daily. Discussed where compression hose may be purchased.  Avoid weight gain consider weight loss and low impact exercise once right thigh swelling resolved.  Avoid massage of  right thigh at this time.  May alternate tylenol with motrin if tylenol not providing adequate relief.  Max dose ibuprofen 800mg  po TID take with food.  Discussed ibuprofen can raise blood pressure. Given exitcare handout printed on varicose veins, DVT, nocturnal legs cramps.  Patient verbalized understanding information/instructions, agreed with plan of care and had no further questions at this time.  Discussed with patient leg cramps can be related to electrolytes, leg swelling, muscle cramps due to inactivity.  Trial zero sugar powerade or gatorade the next day after leg cramps.  Stretches prior to bedtime toes elevated on step or heel flat on ground with knee bent hold for 15 seconds and repeat 3-5 times each leg over 5-10 minutes.  Considering that patient has history of obesity and elevated Hgba1C could have PAD.  Start wearing at least calf high compression after right leg swelling resolves.  Currently recommending support hose/tights so that there is not a bunching of compression area adjacent to affected distal medial thigh from bicycle shorts or thigh highs or knee highs.    Patient to follow up if these recommendations do not help to decrease frequency or intensity of leg cramps  Patient verbalized understanding of information/instructions, agreed with plan of care and had no further questions at this time.  Follow up with RN Rolly Salter for repeat blood pressure next week.  Be well BP 118/86 Jun 2018.  Discussed acute pain can raise BP along with NSAID use/caffeine/stress/anxiety.

## 2017-10-26 NOTE — Patient Instructions (Addendum)
Leg Cramps Leg cramps occur when a muscle or muscles tighten and you have no control over this tightening (involuntary muscle contraction). Muscle cramps can develop in any muscle, but the most common place is in the calf muscles of the leg. Those cramps can occur during exercise or when you are at rest. Leg cramps are painful, and they may last for a few seconds to a few minutes. Cramps may return several times before they finally stop. Usually, leg cramps are not caused by a serious medical problem. In many cases, the cause is not known. Some common causes include:  Overexertion.  Overuse from repetitive motions, or doing the same thing over and over.  Remaining in a certain position for a long period of time.  Improper preparation, form, or technique while performing a sport or an activity.  Dehydration.  Injury.  Side effects of some medicines.  Abnormally low levels of the salts and ions in your blood (electrolytes), especially potassium and calcium. These levels could be low if you are taking water pills (diuretics) or if you are pregnant.  Follow these instructions at home: Watch your condition for any changes. Taking the following actions may help to lessen any discomfort that you are feeling:  Stay well-hydrated. Drink enough fluid to keep your urine clear or pale yellow.  Try massaging, stretching, and relaxing the affected muscle. Do this for several minutes at a time.  For tight or tense muscles, use a warm towel, heating pad, or hot shower water directed to the affected area.  If you are sore or have pain after a cramp, applying ice to the affected area may relieve discomfort. ? Put ice in a plastic bag. ? Place a towel between your skin and the bag. ? Leave the ice on for 20 minutes, 2-3 times per day.  Avoid strenuous exercise for several days if you have been having frequent leg cramps.  Make sure that your diet includes the essential minerals for your muscles to  work normally.  Take medicines only as directed by your health care provider.  Contact a health care provider if:  Your leg cramps get more severe or more frequent, or they do not improve over time.  Your foot becomes cold, numb, or blue. This information is not intended to replace advice given to you by your health care provider. Make sure you discuss any questions you have with your health care provider. Document Released: 09/17/2004 Document Revised: 01/16/2016 Document Reviewed: 07/18/2014 Elsevier Interactive Patient Education  2018 [employer] Deep Vein Thrombosis Deep vein thrombosis (DVT) is a condition in which a blood clot forms in a deep vein, such as a lower leg, thigh, or arm vein. A clot is blood that has thickened into a gel or solid. This condition is dangerous. It can lead to serious and even life-threatening complications if the clot travels to the lungs and causes a blockage (pulmonary embolism). It can also damage veins in the leg. This can result in leg pain, swelling, discoloration, and sores (post-thrombotic syndrome). What are the causes? This condition may be caused by:  A slowdown of blood flow.  Damage to a vein.  A condition that makes blood clot more easily.  What increases the risk? The following factors may make you more likely to develop this condition:  Being overweight.  Being elderly, especially over age 44.  Sitting or lying down for more than four hours.  Lack of physical activity (sedentary lifestyle).  Being pregnant, giving birth,  or having recently given birth.  Taking medicines that contain estrogen.  Smoking.  A history of any of the following: ? Blood clots or blood clotting disease. ? Peripheral vascular disease. ? Inflammatory bowel disease. ? Cancer. ? Heart disease. ? Genetic conditions that affect how blood clots. ? Neurological diseases that affect the legs (leg paresis). ? Injury. ? Major or lengthy surgery. ? A  central line placed inside a large vein.  What are the signs or symptoms? Symptoms of this condition include:  Swelling, pain, or tenderness in an arm or leg.  Warmth, redness, or discoloration in an arm or leg.  If the clot is in your leg, symptoms may be more noticeable or worse when you stand or walk. Some people do not have any symptoms. How is this diagnosed? This condition is diagnosed with:  A medical history.  A physical exam.  Tests, such as: ? Blood tests. These are done to see how your blood clots. ? Imaging tests. These are done to check for clots. Tests may include:  Ultrasound.  CT scan.  MRI.  X-ray.  Venogram. For this test, X-rays are taken after a dye is injected into a vein.  How is this treated? Treatment for this condition depends on the cause, your risk for bleeding or developing more clots, and any medical conditions you have. Treatment may include:  Taking blood thinners (also called anticoagulants). These medicines may be taken by mouth, injected under the skin, or injected through an IV tube (catheter). These medicines prevent clots from forming.  Injecting medicine that dissolves blood clots into the affected vein (catheter-directed thrombolysis).  Having surgery. Surgery may be done to: ? Remove the clot. ? Place a filter in a large vein to catch blood clots before they reach the lungs.  Some treatments may be continued for up to six months. Follow these instructions at home: If you are taking an oral blood thinner:  Take the medicine exactly as told by your health care provider. Some blood thinners need to be taken at the same time every day. Do not skip a dose.  Ask your health care provider about what foods and drugs interact with the medicine.  Ask about possible side effects. General instructions  Blood thinners can cause easy bruising and difficulty stopping bleeding. Because of this, if you are taking or were given a blood  thinner: ? Hold pressure over cuts for longer than usual. ? Tell your dentist and other health care providers that you are taking blood thinners before having any procedures that can cause bleeding. ? Avoid contact sports.  Take over-the-counter and prescription medicines only as told by your health care provider.  Return to your normal activities as told by your health care provider. Ask your health care provider what activities are safe for you.  Wear compression stockings if recommended by your health care provider.  Keep all follow-up visits as told by your health care provider. This is important. How is this prevented? To lower your risk of developing this condition again:  For 30 or more minutes every day, do an activity that: ? Involves moving your arms and legs. ? Increases your heart rate.  When traveling for longer than four hours: ? Exercise your arms and legs every hour. ? Drink plenty of water. ? Avoid drinking alcohol.  Avoid sitting or lying for a long time without moving your legs.  Stay a healthy weight.  If you are a woman who is older than  age 61, avoid unnecessary use of medicines that contain estrogen.  Do not use any products that contain nicotine or tobacco, such as cigarettes and e-cigarettes. This is especially important if you take estrogen medicines. If you need help quitting, ask your health care provider.  Contact a health care provider if:  You miss a dose of your blood thinner.  You have nausea, vomiting, or diarrhea that lasts for more than one day.  Your menstrual period is heavier than usual.  You have unusual bruising. Get help right away if:  You have new or increased pain, swelling, or redness in an arm or leg.  You have numbness or tingling in an arm or leg.  You have shortness of breath.  You have chest pain.  You have a rapid or irregular heartbeat.  You feel light-headed or dizzy.  You cough up blood.  There is blood in  your vomit, stool, or urine.  You have a serious fall or accident, or you hit your head.  You have a severe headache or confusion.  You have a cut that will not stop bleeding. These symptoms may represent a serious problem that is an emergency. Do not wait to see if the symptoms will go away. Get medical help right away. Call your local emergency services (911 in the [place] Do not drive yourself to the hospital. Summary  DVT is a condition in which a blood clot forms in a deep vein, such as a lower leg, thigh, or arm vein.  Symptoms can include swelling, warmth, pain, and redness in your leg or arm.  Treatment may include taking blood thinners, injecting medicine that dissolves blood clots,wearing compression stockings, or surgery.  If you are prescribed blood thinners, take them exactly as told. This information is not intended to replace advice given to you by your health care provider. Make sure you discuss any questions you have with your health care provider. Document Released: 08/10/2005 Document Revised: 09/12/2016 Document Reviewed: 09/12/2016 Elsevier Interactive Patient Education  2018 [employer]. Varicose Veins Varicose veins are veins that have become enlarged and twisted. They are usually seen in the legs but can occur in other parts of the body as well. What are the causes? This condition is the result of valves in the veins not working properly. Valves in the veins help to return blood from the leg to the heart. If these valves are damaged, blood flows backward and backs up into the veins in the leg near the skin. This causes the veins to become larger. What increases the risk? People who are on their feet a lot, who are pregnant, or who are overweight are more likely to develop varicose veins. What are the signs or symptoms?  Bulging, twisted-appearing, bluish veins, most commonly found on the legs.  Leg pain or a feeling of heaviness. These symptoms may be worse at  the end of the day.  Leg swelling.  Changes in skin color. How is this diagnosed? A health care provider can usually diagnose varicose veins by examining your legs. Your health care provider may also recommend an ultrasound of your leg veins. How is this treated? Most varicose veins can be treated at home.However, other treatments are available for people who have persistent symptoms or want to improve the cosmetic appearance of the varicose veins. These treatment options include:  Sclerotherapy. A solution is injected into the vein to close it off.  Laser treatment. A laser is used to heat the vein to close  it off.  Radiofrequency vein ablation. An electrical current produced by radio waves is used to close off the vein.  Phlebectomy. The vein is surgically removed through small incisions made over the varicose vein.  Vein ligation and stripping. The vein is surgically removed through incisions made over the varicose vein after the vein has been tied (ligated).  Follow these instructions at home:  Do not stand or sit in one position for long periods of time. Do not sit with your legs crossed. Rest with your legs raised during the day.  Wear compression stockings as directed by your health care provider. These stockings help to prevent blood clots and reduce swelling in your legs.  Do not wear other tight, encircling garments around your legs, pelvis, or waist.  Walk as much as possible to increase blood flow.  Raise the foot of your bed at night with 2-inch blocks.  If you get a cut in the skin over the vein and the vein bleeds, lie down with your leg raised and press on it with a clean cloth until the bleeding stops. Then place a bandage (dressing) on the cut. See your health care provider if it continues to bleed. Contact a health care provider if:  The skin around your ankle starts to break down.  You have pain, redness, tenderness, or hard swelling in your leg over a  vein.  You are uncomfortable because of leg pain. This information is not intended to replace advice given to you by your health care provider. Make sure you discuss any questions you have with your health care provider. Document Released: 05/20/2005 Document Revised: 01/16/2016 Document Reviewed: 02/11/2016 Elsevier Interactive Patient Education  2017 [employer].

## 2017-10-29 NOTE — Addendum Note (Signed)
Addended by: Loraine GripWORKMAN, HALEY S on: 10/29/2017 02:38 PM   Modules accepted: Orders

## 2017-11-01 ENCOUNTER — Other Ambulatory Visit: Payer: Self-pay

## 2017-11-01 ENCOUNTER — Ambulatory Visit (HOSPITAL_BASED_OUTPATIENT_CLINIC_OR_DEPARTMENT_OTHER)
Admission: RE | Admit: 2017-11-01 | Discharge: 2017-11-01 | Disposition: A | Discharge status: Home / Self Care | Payer: No Typology Code available for payment source | Source: Ambulatory Visit | Attending: Registered Nurse | Admitting: Registered Nurse

## 2017-11-01 ENCOUNTER — Emergency Department (HOSPITAL_COMMUNITY)
Admission: EM | Admit: 2017-11-01 | Discharge: 2017-11-01 | Disposition: A | Discharge status: Home / Self Care | Payer: No Typology Code available for payment source | Attending: Emergency Medicine | Admitting: Emergency Medicine

## 2017-11-01 ENCOUNTER — Telehealth (INDEPENDENT_AMBULATORY_CARE_PROVIDER_SITE_OTHER): Payer: Self-pay | Admitting: Vascular Surgery

## 2017-11-01 ENCOUNTER — Encounter (HOSPITAL_COMMUNITY): Payer: Self-pay | Admitting: Family Medicine

## 2017-11-01 ENCOUNTER — Telehealth: Payer: Self-pay | Admitting: *Deleted

## 2017-11-01 DIAGNOSIS — I824Z1 Acute embolism and thrombosis of unspecified deep veins of right distal lower extremity: Secondary | ICD-10-CM | POA: Insufficient documentation

## 2017-11-01 DIAGNOSIS — M7989 Other specified soft tissue disorders: Secondary | ICD-10-CM | POA: Insufficient documentation

## 2017-11-01 DIAGNOSIS — I83891 Varicose veins of right lower extremities with other complications: Secondary | ICD-10-CM | POA: Insufficient documentation

## 2017-11-01 DIAGNOSIS — M79604 Pain in right leg: Secondary | ICD-10-CM

## 2017-11-01 LAB — I-STAT CHEM 8, ED
BUN: 13 mg/dL (ref 6–20)
CALCIUM ION: 1.12 mmol/L — AB (ref 1.15–1.40)
Chloride: 102 mmol/L (ref 101–111)
Creatinine, Ser: 0.7 mg/dL (ref 0.44–1.00)
GLUCOSE: 129 mg/dL — AB (ref 65–99)
HCT: 40 % (ref 36.0–46.0)
Hemoglobin: 13.6 g/dL (ref 12.0–15.0)
Potassium: 3.9 mmol/L (ref 3.5–5.1)
SODIUM: 141 mmol/L (ref 135–145)
TCO2: 28 mmol/L (ref 22–32)

## 2017-11-01 LAB — CBC
HCT: 40.8 % (ref 36.0–46.0)
HEMOGLOBIN: 12.7 g/dL (ref 12.0–15.0)
MCH: 28.3 pg (ref 26.0–34.0)
MCHC: 31.1 g/dL (ref 30.0–36.0)
MCV: 91.1 fL (ref 78.0–100.0)
Platelets: 197 10*3/uL (ref 150–400)
RBC: 4.48 MIL/uL (ref 3.87–5.11)
RDW: 14.7 % (ref 11.5–15.5)
WBC: 5.3 10*3/uL (ref 4.0–10.5)

## 2017-11-01 MED ORDER — RIVAROXABAN 15 MG PO TABS
15.0000 mg | ORAL_TABLET | Freq: Once | ORAL | Status: AC
  Administered 2017-11-01: 15 mg via ORAL
  Filled 2017-11-01: qty 1

## 2017-11-01 MED ORDER — RIVAROXABAN (XARELTO) EDUCATION KIT FOR DVT/PE PATIENTS
PACK | Freq: Once | Status: AC
  Administered 2017-11-01: 21:00:00
  Filled 2017-11-01: qty 1

## 2017-11-01 MED ORDER — RIVAROXABAN (XARELTO) VTE STARTER PACK (15 & 20 MG): ORAL_TABLET | ORAL | 0 refills | Status: DC

## 2017-11-01 NOTE — Progress Notes (Signed)
Bilateral lower extremity venous duplex completed. Right - No evidence of a DVT or Baker's cyst. 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 Left - There is no evidence of a DVT, superficial thrombosis or Baker's cyst. [facility] Slaughert, RVS 11/01/2017 10:30 AM

## 2017-11-01 NOTE — Telephone Encounter (Signed)
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 Contacted Dr Sullivan LoneGilbert for telephone consult and due to superficial only continue NSAIDS, compression, hot/cold packs, elevation and aspirin daily and monitor for worsening swelling/pain or new symptoms chest pain, visual changes, headache, dyspnea. Instructed nurse to emphasize plan of care with patient and to schedule her follow up appt with me tomorrow and Thursday this week.  1220 notified by RN patient now at work works in call center on telephone/computer.  Patient has not taken any aspirin since appt with me last week, +compression, nsaids, ice/heat. And no strenuous activity/massage.  New symptoms in right calf and upper thigh resolving.  Patient reported to RN that US tech did scan calf today also.  Awaiting radiology report. 1535 US results available via Epic for review  Noted below.  Contacted Dr Sullivan LoneGilbert to discuss results at 1540.  Due to patient  family history brother DVT and extension of symptoms/affected area recommended telephone consult with Dr Levora DredgeGregory Schnier [facility],  [place] ([place] provider today).  He recommended starting anticoagulation tonight via ER due to thrombus within 10cm on confluence with femoral vein and follow up appt with his clinic tomorrow.  Attempted to reach patient at home no answer no voicemail.  Contacted patient at work and notified her of updated plan of care recommended by specialist upon review of written US report.  [facility] to start anticoagulant therapy due to extension of superficial clot within 10cm of Femoral (deep) vein.  Discussed with patient Dr Gilda CreaseSchnier on call until 7pm.  Patient to continue compression socks, avoid high impact exercises/massage, ice/heat packs 15 minutes TID/QID, NSAIDS.  Patient verbalized understanding information/instructions, agreed with plan of  care and had no further questions at this time. Milta DeitersSlaughter, [place] D  Cardiovascular Sonographer  Vascular Lab  Progress Notes  Signed  Date of Service:  11/01/2017 10:27 AM          Signed          Bilateral lower extremity venous duplex completed. Right - No evidence of a DVT or Baker's cyst. 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 Left - There is no evidence of a DVT, superficial thrombosis or Baker's cyst. [facility] Sl[facility], RVS 11/01/2017 10:30 AM

## 2017-11-01 NOTE — ED Triage Notes (Signed)
Patient had an ultrasound this morning of her right leg for possible DVT. From the notes of T. Betancourt, NP, patient has multiple superficial varicosities along greater saphenous right. Dr. Levora DredgeGregory Schnier (Vascular Surgery), patient was recommended to come to ED for anticoagulation therapy and follow up with his clinic tomorrow since there was a a thrombus within 10 cm on with femoral vein.

## 2017-11-01 NOTE — Discharge Instructions (Signed)
Information on my medicine - XARELTO (rivaroxaban)  This medication education was reviewed with me or my healthcare representative as part of my discharge preparation.  The pharmacist that spoke with me during my hospital stay was:  Elson ClanLilliston, Demetrius Mahler Michelle, [facility]  WHY WAS [name] PRESCRIBED FOR YOU? Xarelto was prescribed to treat blood clots that may have been found in the veins of your legs (deep vein thrombosis) or in your lungs (pulmonary embolism) and to reduce the risk of them occurring again.  WHAT DO YOU NEED TO KNOW ABOUT XARELTO? The starting dose is one 15 mg tablet taken TWICE daily with food for the FIRST 21 DAYS then on April 3rd  the dose is changed to one 20 mg tablet taken ONCE A DAY with your evening meal.  DO NOT stop taking Xarelto without talking to the health care provider who prescribed the medication.  Refill your prescription for 20 mg tablets before you run out.  After discharge, you should have regular check-up appointments with your healthcare provider that is prescribing your Xarelto.  In the future your dose may need to be changed if your kidney function changes by a significant amount.  WHAT DO YOU DO IF YOU MISS A DOSE? If you are taking Xarelto TWICE DAILY and you miss a dose, take it as soon as you remember. You may take two 15 mg tablets (total 30 mg) at the same time then resume your regularly scheduled 15 mg twice daily the next day.  If you are taking Xarelto ONCE DAILY and you miss a dose, take it as soon as you remember on the same day then continue your regularly scheduled once daily regimen the next day. Do not take two doses of Xarelto at the same time.   IMPORTANT SAFETY INFORMATION Xarelto is a blood thinner medicine that can cause bleeding. You should call your healthcare provider right away if you experience any of the following: Bleeding from an injury or your nose that does not stop. Unusual colored urine (red or dark brown) or unusual  colored stools (red or black). Unusual bruising for unknown reasons. A serious fall or if you hit your head (even if there is no bleeding).  Some medicines may interact with Xarelto and might increase your risk of bleeding while on Xarelto. To help avoid this, consult your healthcare provider or pharmacist prior to using any new prescription or non-prescription medications, including herbals, vitamins, non-steroidal anti-inflammatory drugs (NSAIDs) and supplements.  This website has more information on Xarelto: [email or web address].

## 2017-11-01 NOTE — ED Provider Notes (Signed)
[facility] DEPT Provider Note   CSN: [id number] Arrival date & time: 11/01/17  1806     History   Chief Complaint Chief Complaint  Patient presents with  . DVT    HPI Danielle Cooke is a 42 y.o. female.  HPI 43 year old female with no significant past medical history here with right leg pain and swelling.  Patient reportedly was recently seen at her PCP and a ultrasound showed superficial clots in known varicose veins.  She states that her symptoms started over the right medial thigh and have progressed to redness and pain and a palpable mass in her proximal thigh extending down to her calf.  She was sent for ultrasound again today which showed large amount of superficial vein thrombosis that is 2 cm within the common femoral.  No overt DVT.  Vascular surgery was consulted and the patient was sent here to start anticoagulation.  She strongly denies any associated chest pain, shortness of breath, hypoxia, or other complaints.  She has no history of DVT, but her brother does have a history of DVT.  No recent surgeries or immobilizations.  She is not on birth control.  She does not smoke.  Past Medical History:  Diagnosis Date  . Allergy     There are no active problems to display for this patient.   Past Surgical History:  Procedure Laterality Date  . ANAL FISTULECTOMY      OB History    No data available       Home Medications    Prior to Admission medications   Medication Sig Start Date End Date Taking? Authorizing Provider  albuterol (PROVENTIL HFA;VENTOLIN HFA) 108 (90 Base) MCG/ACT inhaler Inhale into the lungs every 6 (six) hours as needed for wheezing or shortness of breath.   Yes [provider]  Biotin 10 MG CAPS Take by mouth.   Yes [provider]  Rivaroxaban 15 & 20 MG TBPK Take as directed on package: Start with one 63m tablet by mouth twice a day with food. On Day 22, switch to one 252mtablet once a day  with food. 11/01/17   IsDuffy BruceMD    Family History Family History  Problem Relation Age of Onset  . Diabetes Mother   . Cancer Father     Social History Social History   Tobacco Use  . Smoking status: Never Smoker  . Smokeless tobacco: Never Used  Substance Use Topics  . Alcohol use: Yes    Comment: 1-2 times a month  . Drug use: No     Allergies   Patient has no known allergies.   Review of Systems Review of Systems  Cardiovascular: Positive for leg swelling.  All other systems reviewed and are negative.    Physical Exam Updated Vital Signs BP (!) 110/59 (BP Location: Right Arm)   Pulse 69   Temp 97.9 F (36.6 C) (Oral)   Resp 16   Ht _0  (1.549 m)   Wt 122.5 kg (270 lb)   LMP 10/14/2017   SpO2 99%   BMI 51.02 kg/m   Physical Exam  Constitutional: She is oriented to person, place, and time. She appears well-developed and well-nourished. No distress.  HENT:  Head: Normocephalic and atraumatic.  Eyes: Conjunctivae are normal.  Neck: Neck supple.  Cardiovascular: Normal rate, regular rhythm and normal heart sounds.  Pulmonary/Chest: Effort normal. No respiratory distress. She has no wheezes.  Abdominal: She exhibits no distension.  Musculoskeletal: She exhibits  no edema.  Neurological: She is alert and oriented to person, place, and time. She exhibits normal muscle tone.  Skin: Skin is warm. Capillary refill takes less than 2 seconds. No rash noted.  Nursing note and vitals reviewed.   LOWER EXTREMITY EXAM: Right  INSPECTION & PALPATION: Mild tenderness and erythema overlying the medial thigh extending to the popliteal area, with no redness extending distal to this.  Palpable cord along the saphenous vein proximal to the knee.  No other leg edema or asymmetry.  No pallor.  No cyanosis.   SENSORY: sensation is intact to light touch in:  Superficial peroneal nerve distribution (over dorsum of foot) Deep peroneal nerve distribution (over  first dorsal web space) Sural nerve distribution (over lateral aspect 5th metatarsal) Saphenous nerve distribution (over medial instep)  MOTOR:  + Motor EHL (great toe dorsiflexion) + FHL (great toe plantar flexion)  + TA (ankle dorsiflexion)  + GSC (ankle plantar flexion)  VASCULAR: 2+ dorsalis pedis and posterior tibialis pulses Capillary refill < 2 sec, toes warm and well-perfused  COMPARTMENTS: Soft, warm, well-perfused No pain with passive extension No parethesias   ED Treatments / Results  Labs (all labs ordered are listed, but only abnormal results are displayed) Labs Reviewed  I-STAT CHEM 8, ED - Abnormal; Notable for the following components:      Result Value   Glucose, Bld 129 (*)    Calcium, Ion 1.12 (*)    All other components within normal limits  CBC    EKG  EKG Interpretation None       Radiology No results found.  Procedures Procedures (including critical care time)  Medications Ordered in ED Medications  rivaroxaban [facility] Re[employer]) Education Kit for DVT/PE patients ( Does not apply Given 11/01/17 2111)  Rivaroxaban (XARELTO) tablet 15 mg (15 mg Oral Given 11/01/17 2112)     Initial Impression / Assessment and Plan / ED Course  I have reviewed the triage vital signs and the nursing notes.  Pertinent labs & imaging results that were available during my care of the patient were reviewed by me and considered in my medical decision making (see chart for details).      42 year old female here with large superficial saphenous clot of the right leg now extending close to the femoral.  No evidence of PE.  Patient is otherwise afebrile and hemodynamically stable.  She has no evidence of phlegmasia.  Distal neuro vasculature is fully intact.  Platelets normal.  Renal function normal.  Discussed case with Dr. Oneida Alar, will start on Xarelto and patient will follow up with [facility] vein specialist tomorrow morning.  Return precautions given.  Counseled on Xarelto  use.  Final Clinical Impressions(s) / ED Diagnoses   Final diagnoses:  Acute deep vein thrombosis (DVT) of distal end of right lower extremity [facility])    ED Discharge Orders        Ordered    Rivaroxaban 15 & 20 MG TBPK     11/01/17 2109       Cooke Bruce, MD 11/01/17 2147

## 2017-11-01 NOTE — Progress Notes (Signed)
Patient has been notified of results via telephone by RN and myself today see telephone encounter.

## 2017-11-01 NOTE — Telephone Encounter (Signed)
Results of BLE Vas US called to RN by Timmothy EulerVas tech as NP unavailable this am and pt could not leave until report was given and recommendations received. Multiple superficial varicosities of greater saphenous vein found. Not quite in deep venous system but very close. Pt with tech and RN spoke with pt on phone. Pt in no distress, no ShOB, pain no worse than when seen in clinic last week. 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   NP made aware approx 45 min after initial phone call from Vascular. She called MD Sullivan LoneGilbert and returned RN call with recommendations for pt for ASA-325mg  daily po during acute episode, heat-7215min 3-4 times/day to thrombosis site, and avoid strenous activity. These recommendations along with ER precautions reviewed with pt via phone. She reports pain in R lower thigh almost resolved, but pain started in R lower calf over the weekend with redness and warmth. NP made aware of same.   Appts made for f/u for pt in NP clinic for tomorrow and Thursday. Pt has no further questions/concerns. Will route US report to Dr. Sullivan LoneGilbert upon result per his request for review.

## 2017-11-02 ENCOUNTER — Encounter (INDEPENDENT_AMBULATORY_CARE_PROVIDER_SITE_OTHER): Payer: Self-pay | Admitting: Vascular Surgery

## 2017-11-02 ENCOUNTER — Ambulatory Visit (INDEPENDENT_AMBULATORY_CARE_PROVIDER_SITE_OTHER): Payer: No Typology Code available for payment source | Admitting: Vascular Surgery

## 2017-11-02 VITALS — BP 108/63 | HR 70 | Resp 17 | Ht 61.0 in | Wt 281.0 lb

## 2017-11-02 DIAGNOSIS — I82811 Embolism and thrombosis of superficial veins of right lower extremities: Secondary | ICD-10-CM

## 2017-11-02 DIAGNOSIS — R600 Localized edema: Secondary | ICD-10-CM

## 2017-11-02 NOTE — Telephone Encounter (Signed)
Pt has a appt on 11-02-17

## 2017-11-02 NOTE — Progress Notes (Signed)
Subjective:    Patient ID: Danielle Cooke, female    DOB: Oct 21, 1975, 42 y.o.   MRN: [id number] Chief Complaint  Patient presents with  . New Patient (Initial Visit)   Presents as a new patient referred by nurse practitioner Betancourt for evaluation of a right lower extremity great saphenous vein superficial thrombosis.  The patient reports experiencing right upper thigh pain and erythema approximately 12 days ago.  Patient was seen by nurse practitioner Betancourt on October 26, 2017.  A bilateral lower extremity venous reflux was completed on November 01, 2017 which was notable for Greater saphenous thrombosis coursing from the distal calf to approximately 2 inches from the confluence with the common femoral vein. There are also multiple thrombosis varicosities in the calf and thigh.  Left lower extremity was negative.  Nurse practitioner Betancourt page Dr. Gilda Crease yesterday and was recommended to start the patient on oral anticoagulation due to the proximity of the SVT to the deep venous system.  Nurse practitioner Betancourt refused.  We explained that we could not call in this prescription for the patient as we had not seen her in our clinic yet.  Nurse practitioner Betancourt continue to refuse to call in anticoagulation and the patient was sent to the ED to be started on oral anticoagulation.  The patient presents today after last night and started on Xarelto.  The patient endorses a history of lung lower extremity edema to the bilateral lower extremity.  The patient denies any trauma / surgery to the right lower extremity.  The patient denies any past medical history of DVT to the bilateral lower extremity.  At this time, the patient does not engage in conservative therapy including wearing medical grade 1 compression stockings or elevating her legs on a daily basis.  The patient's discomfort to her right lower extremity has not worsened.  Her skin is intact.  The patient denies any fever, nausea  vomiting.  The patient denies any shortness of breath or chest pain.   Review of Systems  Constitutional: Negative.   HENT: Negative.   Eyes: Negative.   Respiratory: Negative.   Cardiovascular: Positive for leg swelling.       RLE SVT  Gastrointestinal: Negative.   Endocrine: Negative.   Genitourinary: Negative.   Musculoskeletal: Negative.   Skin: Negative.   Allergic/Immunologic: Negative.   Neurological: Negative.   Hematological: Negative.   Psychiatric/Behavioral: Negative.       Objective:   Physical Exam  Constitutional: She is oriented to person, place, and time. She appears well-developed and well-nourished. No distress.  Obese  HENT:  Head: Normocephalic and atraumatic.  Eyes: Conjunctivae are normal. Pupils are equal, round, and reactive to light.  Neck: Normal range of motion.  Cardiovascular: Normal rate, regular rhythm, normal heart sounds and intact distal pulses.  Pulses:      Radial pulses are 2+ on the left side.  Hard to palpate pedal pulses due to body habitus and edema however the bilateral feet are warm Right lower extremity: Thigh is soft.  Calf is soft.  There is no pain with palpation or dorsiflexion.  There is no cellulitis to the extremity.  Skin is intact.  Pulmonary/Chest: Effort normal and breath sounds normal.  Musculoskeletal: Normal range of motion. She exhibits edema (Mild to moderate bilateral lower extremity nonpitting edema).  Neurological: She is alert and oriented to person, place, and time.  Skin: She is not diaphoretic.  Psychiatric: She has a normal mood and affect. Her behavior is  normal. Judgment and thought content normal.  Vitals reviewed.  BP 108/63 (BP Location: Right Arm, Patient Position: Sitting)   Pulse 70   Resp 17   Ht 5\' 1"  (1.549 m)   Wt 281 lb (127.5 kg)   LMP 10/14/2017   BMI 53.09 kg/m   Past Medical History:  Diagnosis Date  . Allergy   . DVT (deep venous thrombosis) (HCC)   . Peripheral vascular  disease (HCC)    Social History   Socioeconomic History  . Marital status: Single    Spouse name: Not on file  . Number of children: Not on file  . Years of education: Not on file  . Highest education level: Not on file  Social Needs  . Financial resource strain: Not on file  . Food insecurity - worry: Not on file  . Food insecurity - inability: Not on file  . Transportation needs - medical: Not on file  . Transportation needs - non-medical: Not on file  Occupational History  . Not on file  Tobacco Use  . Smoking status: Never Smoker  . Smokeless tobacco: Never Used  Substance and Sexual Activity  . Alcohol use: Yes    Comment: 1-2 times a month  . Drug use: No  . Sexual activity: Not on file  Other Topics Concern  . Not on file  Social History Narrative  . Not on file   Past Surgical History:  Procedure Laterality Date  . ANAL FISTULECTOMY    . FINGER SURGERY     Family History  Problem Relation Age of Onset  . Diabetes Mother   . Cancer Father    No Known Allergies     Assessment & Plan:  Presents as a new patient referred by nurse practitioner Betancourt for evaluation of a right lower extremity great saphenous vein superficial thrombosis.  The patient reports experiencing right upper thigh pain and erythema approximately 12 days ago.  Patient was seen by nurse practitioner Betancourt on October 26, 2017.  A bilateral lower extremity venous reflux was completed on November 01, 2017 which was notable for Greater saphenous thrombosis coursing from the distal calf to approximately 2 inches from the confluence with the common femoral vein. There are also multiple thrombosis varicosities in the calf and thigh.  Left lower extremity was negative.  Nurse practitioner Betancourt page Dr. Gilda Crease yesterday and was recommended to start the patient on oral anticoagulation due to the proximity of the SVT to the deep venous system.  Nurse practitioner Betancourt refused.  We explained  that we could not call in this prescription for the patient as we had not seen her in our clinic yet.  Nurse practitioner Betancourt continue to refuse to call in anticoagulation and the patient was sent to the ED to be started on oral anticoagulation.  The patient presents today after last night and started on Xarelto.  The patient endorses a history of lung lower extremity edema to the bilateral lower extremity.  The patient denies any trauma / surgery to the right lower extremity.  The patient denies any past medical history of DVT to the bilateral lower extremity.  At this time, the patient does not engage in conservative therapy including wearing medical grade 1 compression stockings or elevating her legs on a daily basis.  The patient's discomfort to her right lower extremity has not worsened.  Her skin is intact.  The patient denies any fever, nausea vomiting.  The patient denies any shortness of breath or  chest pain.  1. Superficial thrombosis of lower extremity, right - New Patient was started on Xarelto last night by the emergency department The patient should continue this on a daily basis There is no acute vascular compromise to the right lower extremity The patient is to follow-up in 1 week to undergo a right lower extremity venous duplex to rule out any propagation of the SVT The patient is to follow-up in 3 months and undergo a bilateral venous duplex to rule out any contributing venous disease The patient was encouraged to wear graduated compression stockings (20-30 mmHg) on a daily basis. The patient was instructed to begin wearing the stockings first thing in the morning and removing them in the evening. The patient was instructed specifically not to sleep in the stockings. Prescription given. In addition, behavioral modification including elevation during the day will be initiated. Anti-inflammatories for pain. The patient was instructed to call the office in the interim if any  worsening edema or ulcerations to the legs, feet or toes occurs. The patient is to seek medical attention immediately if she should experience any shortness of breath or chest pain The patient expresses their understanding.  - VAS US LOWER EXTREMITY VENOUS (DVT); Future  2. Bilateral lower extremity edema - New As above   - VAS US LOWER EXTREMITY VENOUS REFLUX; Future  Current Outpatient Medications on File Prior to Visit  Medication Sig Dispense Refill  . albuterol (PROVENTIL HFA;VENTOLIN HFA) 108 (90 Base) MCG/ACT inhaler Inhale into the lungs every 6 (six) hours as needed for wheezing or shortness of breath.    . Biotin 10 MG CAPS Take by mouth.    . Rivaroxaban 15 & 20 MG TBPK Take as directed on package: Start with one 15mg  tablet by mouth twice a day with food. On Day 22, switch to one 20mg  tablet once a day with food. 51 each 0   No current facility-administered medications on file prior to visit.    There are no Patient Instructions on file for this visit. No Follow-up on file.  Amara Justen A Ava Tangney, PA-C

## 2017-11-03 ENCOUNTER — Other Ambulatory Visit: Payer: Self-pay | Admitting: Physician Assistant

## 2017-11-03 DIAGNOSIS — I803 Phlebitis and thrombophlebitis of lower extremities, unspecified: Secondary | ICD-10-CM

## 2017-11-10 ENCOUNTER — Telehealth (INDEPENDENT_AMBULATORY_CARE_PROVIDER_SITE_OTHER): Payer: Self-pay

## 2017-11-10 ENCOUNTER — Ambulatory Visit (INDEPENDENT_AMBULATORY_CARE_PROVIDER_SITE_OTHER): Payer: No Typology Code available for payment source

## 2017-11-10 ENCOUNTER — Encounter (INDEPENDENT_AMBULATORY_CARE_PROVIDER_SITE_OTHER): Payer: Self-pay | Admitting: Vascular Surgery

## 2017-11-10 ENCOUNTER — Telehealth (INDEPENDENT_AMBULATORY_CARE_PROVIDER_SITE_OTHER): Payer: Self-pay | Admitting: Vascular Surgery

## 2017-11-10 DIAGNOSIS — I82811 Embolism and thrombosis of superficial veins of right lower extremities: Secondary | ICD-10-CM

## 2017-11-10 NOTE — Telephone Encounter (Signed)
I called the patient to discuss her ultrasound results from this morning and to let her know that we are going to be calling in Xarelto 20mg  one tab by mouth daily #90 with 3 refills to her pharmacy. I called her home number spoke with her mother who advised me to call her cell phone.  I called her cell phone and her voicemail was full.

## 2017-11-10 NOTE — Telephone Encounter (Signed)
Patient should follow up with her GYN.

## 2017-11-10 NOTE — Telephone Encounter (Signed)
Patient called stating that she received a call from you about her ultrasound. She states the you did not leave a message and she would like for you to call her back today, if possible with those results.

## 2017-11-10 NOTE — Telephone Encounter (Signed)
[facility] [address] [facility]  take 1 tablet by mouth daily with #90 and 3 refills

## 2017-11-10 NOTE — Telephone Encounter (Signed)
Called the patient back to let her know that we have been playing phone tag, and have not been able to leave a message with her ultrasound results. The patient was reached a third time at her home number, but her Mom stated that she was at work and that Selena BattenKim should try to reach her at the home number tomorrow morning before 10:30 of possible. The home number is [phone].

## 2017-11-10 NOTE — Telephone Encounter (Signed)
Patient called stating that she was told that her menstrual cycle could be heavier while on the blood thinners, and she says that this is the case.  She also states that she has had pain shooting down into her legs, on occasions, with her menstrual cycle before, but since being on the blood thinners the pain seems to have increased and she would like to know what to do??

## 2017-11-10 NOTE — Telephone Encounter (Signed)
I was not able to leave a message as the patient's mailbox was full.

## 2017-11-11 ENCOUNTER — Telehealth (INDEPENDENT_AMBULATORY_CARE_PROVIDER_SITE_OTHER): Payer: Self-pay | Admitting: Vascular Surgery

## 2017-11-11 NOTE — Telephone Encounter (Signed)
Called the patient and discussed her recent ultrasound results.  Recent right lower extremity DVT ultrasound notable for:  1) greater than 7 cm from the SFJ (previous 2cm), partially occlusivemid thigh to proximal mid calf. 2) mid thigh to proximal mid calf occlusive 3) adjacent mid to distal thigh and proximal calf varicosities partially occlusive 4) to admit calf varicosities occlusive  At this time, there has been some improvement in the ultrasound as the SVT is now located 7cm from the junction.  Patient to continue taking Xarelto 20 mg one tab PO daily.  Patient to continue to follow-up in 3 months for repeat right lower extremity DVT study.  Patient seek medical attention immediately if she should experience any shortness of breath or chest pain.  The patient expresses her understanding.

## 2017-11-11 NOTE — Telephone Encounter (Signed)
Called the patient on yesterday to let her know to follow up with her GYN, as her situation maybe coming from something else, and it may not be related to the blood thinners, per Selena BattenKim.

## 2017-11-17 ENCOUNTER — Telehealth (INDEPENDENT_AMBULATORY_CARE_PROVIDER_SITE_OTHER): Payer: Self-pay

## 2017-11-17 NOTE — Telephone Encounter (Signed)
Patient left a message on voicemail regarding having questions about dvt and compressions stockings.I attempted to call the patient on both numbers and left a message for the patient to return a call to office

## 2017-11-26 ENCOUNTER — Telehealth (INDEPENDENT_AMBULATORY_CARE_PROVIDER_SITE_OTHER): Payer: Self-pay

## 2017-11-26 NOTE — Telephone Encounter (Signed)
I have not been playing phone tag. I called the patient and discussed her recent ultrasound results and plan for follow up as charted in Epic. She expressed her understanding with both during that phone call.

## 2017-11-26 NOTE — Telephone Encounter (Signed)
Please give her a call about the questions that she has about her blood clots.

## 2017-11-26 NOTE — Telephone Encounter (Signed)
Patient called stating she and Selena BattenKim had been playing phone tag, and that she still has questions about the blood clot that she has and she is off work today and she would like a call back today if that's possible.  She states that she can be reached at either number.

## 2018-02-03 ENCOUNTER — Encounter (INDEPENDENT_AMBULATORY_CARE_PROVIDER_SITE_OTHER): Payer: No Typology Code available for payment source

## 2018-02-03 ENCOUNTER — Ambulatory Visit (INDEPENDENT_AMBULATORY_CARE_PROVIDER_SITE_OTHER): Payer: No Typology Code available for payment source | Admitting: Vascular Surgery

## 2018-03-10 ENCOUNTER — Ambulatory Visit: Payer: Self-pay | Admitting: Registered Nurse

## 2018-03-10 DIAGNOSIS — G4762 Sleep related leg cramps: Secondary | ICD-10-CM

## 2018-03-10 NOTE — Patient Instructions (Signed)
Leg Cramps Leg cramps occur when a muscle or muscles tighten and you have no control over this tightening (involuntary muscle contraction). Muscle cramps can develop in any muscle, but the most common place is in the calf muscles of the leg. Those cramps can occur during exercise or when you are at rest. Leg cramps are painful, and they may last for a few seconds to a few minutes. Cramps may return several times before they finally stop. Usually, leg cramps are not caused by a serious medical problem. In many cases, the cause is not known. Some common causes include:  Overexertion.  Overuse from repetitive motions, or doing the same thing over and over.  Remaining in a certain position for a long period of time.  Improper preparation, form, or technique while performing a sport or an activity.  Dehydration.  Injury.  Side effects of some medicines.  Abnormally low levels of the salts and ions in your blood (electrolytes), especially potassium and calcium. These levels could be low if you are taking water pills (diuretics) or if you are pregnant.  Follow these instructions at home: Watch your condition for any changes. Taking the following actions may help to lessen any discomfort that you are feeling:  Stay well-hydrated. Drink enough fluid to keep your urine clear or pale yellow.  Try massaging, stretching, and relaxing the affected muscle. Do this for several minutes at a time.  For tight or tense muscles, use a warm towel, heating pad, or hot shower water directed to the affected area.  If you are sore or have pain after a cramp, applying ice to the affected area may relieve discomfort. ? Put ice in a plastic bag. ? Place a towel between your skin and the bag. ? Leave the ice on for 20 minutes, 2-3 times per day.  Avoid strenuous exercise for several days if you have been having frequent leg cramps.  Make sure that your diet includes the essential minerals for your muscles to  work normally.  Take medicines only as directed by your health care provider.  Contact a health care provider if:  Your leg cramps get more severe or more frequent, or they do not improve over time.  Your foot becomes cold, numb, or blue. This information is not intended to replace advice given to you by your health care provider. Make sure you discuss any questions you have with your health care provider. Document Released: 09/17/2004 Document Revised: 01/16/2016 Document Reviewed: 07/18/2014 Elsevier Interactive Patient Education  2018 [employer]  

## 2018-03-10 NOTE — Progress Notes (Signed)
Subjective:    Patient ID: Danielle Cooke, female    DOB: 1975-10-04, 42 y.o.   MRN: [id number]  42y/o Caucasian established female pt c/o bil leg and foot cramps for past 1-2 days. States similar to what were occurring when she was Dx with DVT in April. Currently on Xarelto. Last f/u US showed clot was decreasing in size. Next f/u US was rescheduled for next month. Wants to know if leg cramps and DVT are linked sometimes or just coincidence. She denies pain in legs when cramps are not occurring. Denies any swelling, warmth, erythema to BLE.  Wearing compression stockings.  Fell and had bruising anterior right lower extremity denied knot/localized swelling/pain bruising slowly resolving.  Leg pain typically end of day/evenings/bedtime.     Review of Systems  Constitutional: Negative for activity change, appetite change, chills, diaphoresis, fatigue and fever.  HENT: Negative for trouble swallowing and voice change.   Eyes: Negative for photophobia and visual disturbance.  Respiratory: Negative for cough, chest tightness, shortness of breath, wheezing and stridor.   Cardiovascular: Positive for leg swelling. Negative for chest pain and palpitations.  Endocrine: Negative for cold intolerance and heat intolerance.  Genitourinary: Negative for difficulty urinating.  Musculoskeletal: Positive for myalgias. Negative for arthralgias, back pain, gait problem, joint swelling, neck pain and neck stiffness.  Skin: Positive for color change. Negative for pallor, rash and wound.  Allergic/Immunologic: Negative for environmental allergies and food allergies.  Neurological: Negative for dizziness, tremors, seizures, syncope, facial asymmetry, speech difficulty, weakness, light-headedness, numbness and headaches.  Hematological: Negative for adenopathy. Does not bruise/bleed easily.  Psychiatric/Behavioral: Negative for agitation, confusion and sleep disturbance.       Objective:   Physical Exam   Constitutional: She is oriented to person, place, and time. Vital signs are normal. She appears well-developed and well-nourished. No distress.  HENT:  Head: Normocephalic and atraumatic.  Right Ear: External ear normal.  Left Ear: External ear normal.  Nose: Nose normal.  Mouth/Throat: Oropharynx is clear and moist. No oropharyngeal exudate.  Eyes: Pupils are equal, round, and reactive to light. Conjunctivae, EOM and lids are normal. Right eye exhibits no discharge. Left eye exhibits no discharge. No scleral icterus.  Neck: Trachea normal and normal range of motion. Neck supple. No tracheal deviation present. No thyromegaly present.  Cardiovascular: Normal rate, regular rhythm, normal heart sounds and intact distal pulses.  Pulmonary/Chest: Effort normal and breath sounds normal. No stridor. No respiratory distress. She has no wheezes. She has no rales. She exhibits no tenderness.  Abdominal: Soft. She exhibits no distension. There is no guarding.  Musculoskeletal: Normal range of motion. She exhibits no edema, tenderness or deformity.       Right shoulder: Normal.       Left shoulder: Normal.       Right elbow: Normal.      Left elbow: Normal.       Right hip: Normal.       Left hip: Normal.       Right knee: Normal.       Left knee: Normal.       Right ankle: Normal.       Left ankle: Normal.       Cervical back: Normal.       Thoracic back: Normal.       Lumbar back: Normal.       Right hand: Normal.       Left hand: Normal.       Right lower leg:  Normal.       Left lower leg: Normal.       Right foot: Normal.       Left foot: Normal.  Bilateral lower calves measured at midpoint 24 inches equal; compression stocking RLE none LLE; legs nontender no erythema no edema with palpation; capillary refill less than 2 seconds bilaterally negative homan's sign bilaterally  Lymphadenopathy:    She has no cervical adenopathy.       Right cervical: No superficial cervical adenopathy  present.      Left cervical: No superficial cervical adenopathy present.  Neurological: She is alert and oriented to person, place, and time. No cranial nerve deficit or sensory deficit. She exhibits normal muscle tone. Coordination normal.  Skin: Skin is warm, dry and intact. Capillary refill takes less than 2 seconds. No abrasion, no bruising, no burn, no ecchymosis, no laceration, no lesion, no petechiae and no rash noted. She is not diaphoretic. No cyanosis or erythema. No pallor. Nails show no clubbing.  Psychiatric: She has a normal mood and affect. Her speech is normal and behavior is normal. Judgment and thought content normal. She is not actively hallucinating. Cognition and memory are normal. She is attentive.  Nursing note reviewed.         Assessment & Plan:  A-nocturnal leg cramps  P-demonstrated calf and foot stretches to patient in clinic.  Discussed healthy diet with 4-5 servings fruits/vegetables per day, lean protein each meal including dairy.  Fasting labs magnesium, calcium, B vitamins, female executive panel with RN Rolly SalterHaley within the next week.  Keep vascular appt as scheduled next month.  Follow up sooner if leg swelling/worsening pain/muscle cramps/twitching.  Elevated legs when sitting, continue compression stockings and medications as prescribed by vascular clinic/provider.  Patient verbalized understanding information/instructions, agreed with plan of care and had no further questions at this time.

## 2018-03-17 ENCOUNTER — Ambulatory Visit: Payer: Self-pay | Admitting: *Deleted

## 2018-03-17 DIAGNOSIS — I82811 Embolism and thrombosis of superficial veins of right lower extremities: Secondary | ICD-10-CM

## 2018-03-17 DIAGNOSIS — G4762 Sleep related leg cramps: Secondary | ICD-10-CM

## 2018-03-17 DIAGNOSIS — R7309 Other abnormal glucose: Secondary | ICD-10-CM

## 2018-03-17 NOTE — Progress Notes (Signed)
Labs drawn per orders from 03/10/18 clinic visit.   Pt reports leg cramps have resolved since she reduced her intake of Coca Cola, started drinking more water, and began doing the leg exercises before bed as recommended by NP.

## 2018-03-18 LAB — CMP12+LP+TP+TSH+6AC+CBC/D/PLT
ALBUMIN: 4.3 g/dL (ref 3.5–5.5)
ALK PHOS: 54 IU/L (ref 39–117)
ALT: 25 IU/L (ref 0–32)
AST: 23 IU/L (ref 0–40)
Albumin/Globulin Ratio: 1.4 (ref 1.2–2.2)
BASOS ABS: 0 10*3/uL (ref 0.0–0.2)
BUN / CREAT RATIO: 16 (ref 9–23)
BUN: 12 mg/dL (ref 6–24)
Basos: 0 %
CHLORIDE: 105 mmol/L (ref 96–106)
CHOLESTEROL TOTAL: 170 mg/dL (ref 100–199)
Calcium: 8.6 mg/dL — ABNORMAL LOW (ref 8.7–10.2)
Chol/HDL Ratio: 4.4 ratio (ref 0.0–4.4)
Creatinine, Ser: 0.77 mg/dL (ref 0.57–1.00)
EOS (ABSOLUTE): 0.2 10*3/uL (ref 0.0–0.4)
ESTIMATED CHD RISK: 1 times avg. (ref 0.0–1.0)
Eos: 3 %
FREE THYROXINE INDEX: 1.9 (ref 1.2–4.9)
GFR, EST AFRICAN AMERICAN: 109 mL/min/{1.73_m2} (ref >59)
GFR, EST NON AFRICAN AMERICAN: 94 mL/min/{1.73_m2} (ref >59)
GGT: 17 IU/L (ref 0–60)
GLOBULIN, TOTAL: 3 g/dL (ref 1.5–4.5)
Glucose: 125 mg/dL — ABNORMAL HIGH (ref 65–99)
HDL: 39 mg/dL — ABNORMAL LOW (ref >39)
Hematocrit: 39.4 % (ref 34.0–46.6)
Hemoglobin: 11.6 g/dL (ref 11.1–15.9)
IMMATURE GRANULOCYTES: 0 %
IRON: 26 ug/dL — AB (ref 27–159)
Immature Grans (Abs): 0 10*3/uL (ref 0.0–0.1)
LDH: 207 IU/L (ref 119–226)
LDL Calculated: 116 mg/dL — ABNORMAL HIGH (ref 0–99)
Lymphocytes Absolute: 2.2 10*3/uL (ref 0.7–3.1)
Lymphs: 35 %
MCH: 24.7 pg — AB (ref 26.6–33.0)
MCHC: 29.4 g/dL — AB (ref 31.5–35.7)
MCV: 84 fL (ref 79–97)
MONOCYTES: 10 %
Monocytes Absolute: 0.6 10*3/uL (ref 0.1–0.9)
NEUTROS PCT: 52 %
Neutrophils Absolute: 3.3 10*3/uL (ref 1.4–7.0)
PLATELETS: 269 10*3/uL (ref 150–450)
Phosphorus: 3.5 mg/dL (ref 2.5–4.5)
Potassium: 4.4 mmol/L (ref 3.5–5.2)
RBC: 4.7 x10E6/uL (ref 3.77–5.28)
RDW: 14.9 % (ref 12.3–15.4)
Sodium: 142 mmol/L (ref 134–144)
T3 UPTAKE RATIO: 27 % (ref 24–39)
T4, Total: 7.1 ug/dL (ref 4.5–12.0)
TOTAL PROTEIN: 7.3 g/dL (ref 6.0–8.5)
TRIGLYCERIDES: 76 mg/dL (ref 0–149)
TSH: 2.49 u[IU]/mL (ref 0.450–4.500)
Uric Acid: 5 mg/dL (ref 2.5–7.1)
VLDL Cholesterol Cal: 15 mg/dL (ref 5–40)
WBC: 6.2 10*3/uL (ref 3.4–10.8)

## 2018-03-18 LAB — B12 AND FOLATE PANEL
Folate: 4.8 ng/mL (ref >3.0)
Vitamin B-12: 388 pg/mL (ref 232–1245)

## 2018-03-18 LAB — MAGNESIUM: Magnesium: 2 mg/dL (ref 1.6–2.3)

## 2018-03-21 NOTE — Progress Notes (Signed)
Results reviewed with pt via phone 03/21/18. Pt was called off from work 7/26 when review was scheduled.  B12, Folate, Mag, all electrolytes except Ca WNL. Ca very slightly low. Pt's sx of leg cramps had resolved by day of lab draw after increasing water intake, decreasing soda consumption and starting leg exercises/stretches before bed over the prior week.   Glucose elevated similar to 4 months ago. LDL increased and HDL decreased from one year ago. She reports she has not been eating well recently as she has been taking care of her mother and under more stress. Iron, MCH, MCHC slightly low. Pt was at end of 4-5 day menstrual cycle at time of draw. She reports her cycles are heavy. Likely low 2/2 this as last year was [id number].   Diet and exercise recommendations discussed re: lipids, glucose. Resources emailed to pt of cholesterol handout and [facility] site [facility] for glucose/DM2 information. A1c added on since glucose elevated again and A1c previously high. Advised pt again to establish local pcp and f/u re: untreated DM2 and elevated LDL. Copy of labs provided to pt. MyChart signup emailed to pt's personal email at her request. Will contact pt once new A1c available. No further questions/concerns.

## 2018-03-22 LAB — HGB A1C W/O EAG: Hgb A1c MFr Bld: 6.8 % — ABNORMAL HIGH (ref 4.8–5.6)

## 2018-03-22 LAB — SPECIMEN STATUS REPORT

## 2018-04-06 ENCOUNTER — Encounter (INDEPENDENT_AMBULATORY_CARE_PROVIDER_SITE_OTHER): Payer: Self-pay | Admitting: Vascular Surgery

## 2018-04-06 ENCOUNTER — Encounter

## 2018-04-06 ENCOUNTER — Ambulatory Visit (INDEPENDENT_AMBULATORY_CARE_PROVIDER_SITE_OTHER): Payer: No Typology Code available for payment source | Admitting: Nurse Practitioner

## 2018-04-06 ENCOUNTER — Ambulatory Visit (INDEPENDENT_AMBULATORY_CARE_PROVIDER_SITE_OTHER): Payer: No Typology Code available for payment source

## 2018-04-06 VITALS — BP 113/77 | HR 66 | Resp 15 | Ht 61.0 in | Wt 276.0 lb

## 2018-04-06 DIAGNOSIS — I83813 Varicose veins of bilateral lower extremities with pain: Secondary | ICD-10-CM

## 2018-04-06 DIAGNOSIS — I872 Venous insufficiency (chronic) (peripheral): Secondary | ICD-10-CM | POA: Diagnosis not present

## 2018-04-06 DIAGNOSIS — I82811 Embolism and thrombosis of superficial veins of right lower extremities: Secondary | ICD-10-CM | POA: Diagnosis not present

## 2018-04-06 DIAGNOSIS — R600 Localized edema: Secondary | ICD-10-CM

## 2018-04-06 NOTE — Progress Notes (Signed)
Subjective:    Patient ID: Danielle Cooke, female    DOB: 1976-06-13, 42 y.o.   MRN: [id number] Chief Complaint  Patient presents with  . Follow-up    3 month bilateral reflux U/S    HPI   Danielle Cooke is a 42 year old woman who presents today for follow-up for a superficial thrombosis of the great saphenous vein in her right lower extremity.  The thrombosis was notable because it was approximately 2 inches from the confluence with the common femoral vein, as well as multiple superficial thrombosis varicosities in the calf and thigh.  The left lower extremity was negative for any deep or superficial venous thrombosis.  The patient was started on a course of Xarelto, which as of today she has continued to take.  She states that she has been engaging in conservative therapy by wearing grade 1 compression stockings, as well as elevating her legs on a daily basis.  She denies any fever, nausea, vomiting, or diarrhea.  She denies any shortness of breath or chest pain.  She denies any weakness, cramping, or increased swelling of the lower extremities.  Today she underwent a bilateral lower extremity venous reflux study.  The study found that there is no more evidence of a superficial venous thrombosis.  There is also evidence of reflux in the common femoral vein of the left lower extremity as well as reflux of the great saphenous vein at the proximal and mid thigh of the left lower extremity.  There is reflex in her right lower extremity at the saphenofemoral junction of her great saphenous vein, as well as in the great saphenous vein at the mid thigh and the knee.  Constitutional: [] Weight loss  [] Fever  [] Chills Cardiac: [] Chest pain   [] Chest pressure   [] Palpitations   [] Shortness of breath when laying flat   [] Shortness of breath with exertion. Vascular:  [] Pain in legs with walking   [] Pain in legs with standing  [] History of DVT   [x] Phlebitis   [x] Swelling in legs   [x] Varicose veins    [] Non-healing ulcers Pulmonary:   [] Uses home oxygen   [] Productive cough   [] Hemoptysis   [] Wheeze  [] COPD   [] Asthma Neurologic:  [] Dizziness   [] Seizures   [] History of stroke   [] History of TIA  [] Aphasia   [] Vissual changes   [] Weakness or numbness in arm   [] Weakness or numbness in leg Musculoskeletal:   [] Joint swelling   [] Joint pain   [] Low back pain Hematologic:  [] Easy bruising  [] Easy bleeding   [] Hypercoagulable state   [] Anemic Gastrointestinal:  [] Diarrhea   [] Vomiting  [] Gastroesophageal reflux/heartburn   [] Difficulty swallowing. Genitourinary:  [] Chronic kidney disease   [] Difficult urination  [] Frequent urination   [] Blood in urine Skin:  [] Rashes   [] Ulcers  Psychological:  [] History of anxiety   []  History of major depression.     Objective:   Physical Exam  BP 113/77 (BP Location: Left Arm, Patient Position: Sitting)   Pulse 66   Resp 15   Ht 5\' 1"  (1.549 m)   Wt 276 lb (125.2 kg)   BMI 52.15 kg/m   Past Medical History:  Diagnosis Date  . Allergy   . DVT (deep venous thrombosis) (HCC)   . Peripheral vascular disease (HCC)      Gen: WD/WN, NAD Head: [place]/AT, No temporalis wasting.  Ear/Nose/Throat: Hearing grossly intact, nares w/o erythema or drainage, poor dentition Eyes: PER, EOMI, sclera nonicteric.  Neck: Supple, no masses.  No bruit  or JVD.  Pulmonary:  Good air movement, clear to auscultation bilaterally, no use of accessory muscles.  Cardiac: RRR, normal S1, S2, no Murmurs. Vascular:  Multiple scattered varicosities.  2+ nonpitting edema Vessel Right Left  DP  2+  2+  Gastrointestinal: soft, non-distended. No guarding/no peritoneal signs.  Musculoskeletal: M/S 5/5 throughout.  No deformity or atrophy.  Neurologic: CN 2-12 intact. Pain and light touch intact in extremities.  Symmetrical.  Speech is fluent. Motor exam as listed above. Psychiatric: Judgment intact, Mood & affect appropriate for pt's clinical situation. Dermatologic:no  Venous  rashes no ulcers noted.  No changes consistent with cellulitis. Lymph : No Cervical lymphadenopathy, no lichenification or skin changes of chronic lymphedema.   Social History   Socioeconomic History  . Marital status: Single    Spouse name: Not on file  . Number of children: Not on file  . Years of education: Not on file  . Highest education level: Not on file  Occupational History  . Not on file  Social Needs  . Financial resource strain: Not on file  . Food insecurity:    Worry: Not on file    Inability: Not on file  . Transportation needs:    Medical: Not on file    Non-medical: Not on file  Tobacco Use  . Smoking status: Never Smoker  . Smokeless tobacco: Never Used  Substance and Sexual Activity  . Alcohol use: Yes    Comment: 1-2 times a month  . Drug use: No  . Sexual activity: Not on file  Lifestyle  . Physical activity:    Days per week: Not on file    Minutes per session: Not on file  . Stress: Not on file  Relationships  . Social connections:    Talks on phone: Not on file    Gets together: Not on file    Attends religious service: Not on file    Active member of club or organization: Not on file    Attends meetings of clubs or organizations: Not on file    Relationship status: Not on file  . Intimate partner violence:    Fear of current or ex partner: Not on file    Emotionally abused: Not on file    Physically abused: Not on file    Forced sexual activity: Not on file  Other Topics Concern  . Not on file  Social History Narrative  . Not on file    Past Surgical History:  Procedure Laterality Date  . ANAL FISTULECTOMY    . FINGER SURGERY      Family History  Problem Relation Age of Onset  . Diabetes Mother   . Cancer Father     No Known Allergies     Assessment & Plan:   1. Superficial thrombosis of lower extremity, right Tee patient's lower extremity venous reflux study reveals that her superficial thrombosis has resolved.  I had a  long discussion about the pathophysiology and progression of thrombosis in the deep venous system and how it correlates to the superficial venous system.  We also discussed at length postphlebitic syndrome.  The patient had multiple questions which were answered in detail.  One such question was regarding the patient's Xarelto.  We discussed the normal timeframe of anticoagulation for those with thrombosis in the deep venous system.  However because hers was in the superficial venous system, and hers has shown complete resolution she was advised that she could stop the Xarelto today or  finish her remaining pills for a total of 6 months.  The patient chose to finish the course of Xarelto she was currently taking.  Patient was also advised that she was safe to resume all of her normal activities with no restrictions.  She was advised that on wound cauterized, as well as plane rides it would be beneficial for her to be sure to wear her compression stockings.  2. Bilateral lower extremity edema See below  3. Chronic venous insufficiency As previously mentioned there was a long discussion regarding the pathophysiology of chronic venous insufficiency and varicose veins.  The use of medical grade 1 compression stockings as well as elevation, combined with NSAIDs and exercise was reemphasized to the patient.  She states that she has been following this regimen.  Endovenous laser ablation of the great saphenous vein was discussed as well as a lymph pump.  Patient is apprehensive about undergoing laser ablation, which is understandable given her recent ordeal with the superficial thrombosis of the great saphenous vein.  She has decided that she would like time to consider the possibility of doing the procedure.  4. Varicose veins of both lower extremities with pain At this time the patient continues with conservative management of varicose veins.  We will follow up in 1 year, however she was given the option to  come in follow-up sooner should she change her mind about the laser ablation or find there is any change in her vascular status.   Current Outpatient Medications on File Prior to Visit  Medication Sig Dispense Refill  . albuterol (PROVENTIL HFA;VENTOLIN HFA) 108 (90 Base) MCG/ACT inhaler Inhale into the lungs every 6 (six) hours as needed for wheezing or shortness of breath.    . Biotin 10 MG CAPS Take by mouth.    . Rivaroxaban 15 & 20 MG TBPK Take as directed on package: Start with one 15mg  tablet by mouth twice a day with food. On Day 22, switch to one 20mg  tablet once a day with food. (Patient taking differently: 20 mg. Take as directed on package: Start with one 15mg  tablet by mouth twice a day with food. On Day 22, switch to one 20mg  tablet once a day with food.) 51 each 0   No current facility-administered medications on file prior to visit.     There are no Patient Instructions on file for this visit. No follow-ups on file.   Danielle SpinnerFallon E Urias Sheek, NP

## 2018-04-07 DIAGNOSIS — I8393 Asymptomatic varicose veins of bilateral lower extremities: Secondary | ICD-10-CM | POA: Insufficient documentation

## 2018-04-12 ENCOUNTER — Encounter: Payer: Self-pay | Admitting: *Deleted

## 2018-04-12 NOTE — Progress Notes (Signed)
Care Everywhere inquiry during registration for mammogram with [facility], for previous [place] in [place] as previous [place]CP found to be Epic provider.

## 2018-04-14 ENCOUNTER — Other Ambulatory Visit: Payer: Self-pay

## 2018-04-14 ENCOUNTER — Encounter: Payer: Self-pay | Admitting: Family Medicine

## 2018-04-14 ENCOUNTER — Ambulatory Visit: Payer: No Typology Code available for payment source | Admitting: Family Medicine

## 2018-04-14 VITALS — BP 112/72 | HR 74 | Temp 98.9°F | Ht 61.0 in | Wt 282.0 lb

## 2018-04-14 DIAGNOSIS — J309 Allergic rhinitis, unspecified: Secondary | ICD-10-CM | POA: Diagnosis not present

## 2018-04-14 DIAGNOSIS — I82811 Embolism and thrombosis of superficial veins of right lower extremities: Secondary | ICD-10-CM | POA: Diagnosis not present

## 2018-04-14 DIAGNOSIS — J4520 Mild intermittent asthma, uncomplicated: Secondary | ICD-10-CM

## 2018-04-14 MED ORDER — ALBUTEROL SULFATE HFA 108 (90 BASE) MCG/ACT IN AERS
1.0000 | INHALATION_SPRAY | RESPIRATORY_TRACT | 0 refills | Status: DC | PRN
Start: 2018-04-14 — End: 2022-07-28

## 2018-04-14 NOTE — Patient Instructions (Addendum)
flonase nasal spray and/or Allegra for allergies. Try ot use technique "nose to toes, near to ear" as we discussed.  Albuterol if needed for asthma, but return to discuss asthma if more frequent.   Thank you for coming in today.   Asthma, Adult Asthma is a recurring condition in which the airways tighten and narrow. Asthma can make it difficult to breathe. It can cause coughing, wheezing, and shortness of breath. Asthma episodes, also called asthma attacks, range from minor to life-threatening. Asthma cannot be cured, but medicines and lifestyle changes can help control it. What are the causes? Asthma is believed to be caused by inherited (genetic) and environmental factors, but its exact cause is unknown. Asthma may be triggered by allergens, lung infections, or irritants in the air. Asthma triggers are different for each person. Common triggers include:  Animal dander.  Dust mites.  Cockroaches.  Pollen from trees or grass.  Mold.  Smoke.  Air pollutants such as dust, household cleaners, hair sprays, aerosol sprays, paint fumes, strong chemicals, or strong odors.  Cold air, weather changes, and winds (which increase molds and pollens in the air).  Strong emotional expressions such as crying or laughing hard.  Stress.  Certain medicines (such as aspirin) or types of drugs (such as beta-blockers).  Sulfites in foods and drinks. Foods and drinks that may contain sulfites include dried fruit, potato chips, and sparkling grape juice.  Infections or inflammatory conditions such as the flu, a cold, or an inflammation of the nasal membranes (rhinitis).  Gastroesophageal reflux disease (GERD).  Exercise or strenuous activity.  What are the signs or symptoms? Symptoms may occur immediately after asthma is triggered or many hours later. Symptoms include:  Wheezing.  Excessive nighttime or early morning coughing.  Frequent or severe coughing with a common cold.  Chest  tightness.  Shortness of breath.  How is this diagnosed? The diagnosis of asthma is made by a review of your medical history and a physical exam. Tests may also be performed. These may include:  Lung function studies. These tests show how much air you breathe in and out.  Allergy tests.  Imaging tests such as X-rays.  How is this treated? Asthma cannot be cured, but it can usually be controlled. Treatment involves identifying and avoiding your asthma triggers. It also involves medicines. There are 2 classes of medicine used for asthma treatment:  Controller medicines. These prevent asthma symptoms from occurring. They are usually taken every day.  Reliever or rescue medicines. These quickly relieve asthma symptoms. They are used as needed and provide short-term relief.  Your health care provider will help you create an asthma action plan. An asthma action plan is a written plan for managing and treating your asthma attacks. It includes a list of your asthma triggers and how they may be avoided. It also includes information on when medicines should be taken and when their dosage should be changed. An action plan may also involve the use of a device called a peak flow meter. A peak flow meter measures how well the lungs are working. It helps you monitor your condition. Follow these instructions at home:  Take medicines only as directed by your health care provider. Speak with your health care provider if you have questions about how or when to take the medicines.  Use a peak flow meter as directed by your health care provider. Record and keep track of readings.  Understand and use the action plan to help minimize or stop  an asthma attack without needing to seek medical care.  Control your home environment in the following ways to help prevent asthma attacks: ? Do not smoke. Avoid being exposed to secondhand smoke. ? Change your heating and air conditioning filter regularly. ? Limit your  use of fireplaces and wood stoves. ? Get rid of pests (such as roaches and mice) and their droppings. ? Throw away plants if you see mold on them. ? Clean your floors and dust regularly. Use unscented cleaning products. ? Try to have someone else vacuum for you regularly. Stay out of rooms while they are being vacuumed and for a short while afterward. If you vacuum, use a dust mask from a hardware store, a double-layered or microfilter vacuum cleaner bag, or a vacuum cleaner with a HEPA filter. ? Replace carpet with wood, tile, or vinyl flooring. Carpet can trap dander and dust. ? Use allergy-proof pillows, mattress covers, and box spring covers. ? Wash bed sheets and blankets every week in hot water and dry them in a dryer. ? Use blankets that are made of polyester or cotton. ? Clean bathrooms and kitchens with bleach. If possible, have someone repaint the walls in these rooms with mold-resistant paint. Keep out of the rooms that are being cleaned and painted. ? Wash hands frequently. Contact a health care provider if:  You have wheezing, shortness of breath, or a cough even if taking medicine to prevent attacks.  The colored mucus you cough up (sputum) is thicker than usual.  Your sputum changes from clear or white to yellow, green, gray, or bloody.  You have any problems that may be related to the medicines you are taking (such as a rash, itching, swelling, or trouble breathing).  You are using a reliever medicine more than 2-3 times per week.  Your peak flow is still at 50-79% of your personal best after following your action plan for 1 hour.  You have a fever. Get help right away if:  You seem to be getting worse and are unresponsive to treatment during an asthma attack.  You are short of breath even at rest.  You get short of breath when doing very little physical activity.  You have difficulty eating, drinking, or talking due to asthma symptoms.  You develop chest  pain.  You develop a fast heartbeat.  You have a bluish color to your lips or fingernails.  You are light-headed, dizzy, or faint.  Your peak flow is less than 50% of your personal best. This information is not intended to replace advice given to you by your health care provider. Make sure you discuss any questions you have with your health care provider. Document Released: 08/10/2005 Document Revised: 01/22/2016 Document Reviewed: 03/09/2013 Elsevier Interactive Patient Education  2017 [employer] Allergic Rhinitis, Adult Allergic rhinitis is an allergic reaction that affects the mucous membrane inside the nose. It causes sneezing, a runny or stuffy nose, and the feeling of mucus going down the back of the throat (postnasal drip). Allergic rhinitis can be mild to severe. There are two types of allergic rhinitis:  Seasonal. This type is also called hay fever. It happens only during certain seasons.  Perennial. This type can happen at any time of the year.  What are the causes? This condition happens when the body's defense system (immune system) responds to certain harmless substances called allergens as though they were germs.  Seasonal allergic rhinitis is triggered by pollen, which can come from grasses, trees, and weeds.  Perennial allergic rhinitis may be caused by:  House dust mites.  Pet dander.  Mold spores.  What are the signs or symptoms? Symptoms of this condition include:  Sneezing.  Runny or stuffy nose (nasal congestion).  Postnasal drip.  Itchy nose.  Tearing of the eyes.  Trouble sleeping.  Daytime sleepiness.  How is this diagnosed? This condition may be diagnosed based on:  Your medical history.  A physical exam.  Tests to check for related conditions, such as: ? Asthma. ? Pink eye. ? Ear infection. ? Upper respiratory infection.  Tests to find out which allergens trigger your symptoms. These may include skin or blood tests.  How is  this treated? There is no cure for this condition, but treatment can help control symptoms. Treatment may include:  Taking medicines that block allergy symptoms, such as antihistamines. Medicine may be given as a shot, nasal spray, or pill.  Avoiding the allergen.  Desensitization. This treatment involves getting ongoing shots until your body becomes less sensitive to the allergen. This treatment may be done if other treatments do not help.  If taking medicine and avoiding the allergen does not work, new, stronger medicines may be prescribed.  Follow these instructions at home:  Find out what you are allergic to. Common allergens include smoke, dust, and pollen.  Avoid the things you are allergic to. These are some things you can do to help avoid allergens: ? Replace carpet with wood, tile, or vinyl flooring. Carpet can trap dander and dust. ? Do not smoke. Do not allow smoking in your home. ? Change your heating and air conditioning filter at least once a month. ? During allergy season:  Keep windows closed as much as possible.  Plan outdoor activities when pollen counts are lowest. This is usually during the evening hours.  When coming indoors, change clothing and shower before sitting on furniture or bedding.  Take over-the-counter and prescription medicines only as told by your health care provider.  Keep all follow-up visits as told by your health care provider. This is important. Contact a health care provider if:  You have a fever.  You develop a persistent cough.  You make whistling sounds when you breathe (you wheeze).  Your symptoms interfere with your normal daily activities. Get help right away if:  You have shortness of breath. Summary  This condition can be managed by taking medicines as directed and avoiding allergens.  Contact your health care provider if you develop a persistent cough or fever.  During allergy season, keep windows closed as much as  possible. This information is not intended to replace advice given to you by your health care provider. Make sure you discuss any questions you have with your health care provider. Document Released: 05/05/2001 Document Revised: 09/17/2016 Document Reviewed: 09/17/2016 Elsevier Interactive Patient Education  [employer].   If you have lab work done today you will be contacted with your lab results within the next 2 weeks.  If you have not heard from [place] then please contact us. The fastest way to get your results is to register for My Chart.   IF you received an x-ray today, you will receive an invoice from [facility]. Please contact [facility] at [phone] with questions or concerns regarding your invoice.   IF you received labwork today, you will receive an invoice from [place]. Please contact [facility] at [phone] with questions or concerns regarding your invoice.   Our billing staff will not be able  to assist you with questions regarding bills from these companies.  You will be contacted with the lab results as soon as they are available. The fastest way to get your results is to activate your My Chart account. Instructions are located on the last page of this paperwork. If you have not heard from us regarding the results in 2 weeks, please contact this office.      

## 2018-04-14 NOTE — Progress Notes (Signed)
Subjective:    Patient ID: Danielle Cooke, female    DOB: 02/26/1976, 42 y.o.   MRN: [id number]  HPI Danielle Cooke is a 42 y.o. female Presents today for: Chief Complaint  Patient presents with  . Establish Care    had blood clot 11/01/2017 just stopped Xarelto on 8/14    New patient to me, here to establish care.  History of chronic venous insufficiency, obesity, peripheral edema, DVT diagnosed in March treated with Xarelto.    DVT : based on ER note reviewed in March, actually had large superficial saphenous clot of the right leg extending close to the femoral.  Discussed with vascular surgery at that time, started on Xarelto with plan follow-up with vein specialist.  She has been under the care of [facility], note reviewed from Ms. Brown on 04/06/2018.  Was continuing on Xarelto at that time, compression stockings, there is one.  Additionally was elevating legs on a daily basis.  Bilateral lower extremity venous study done that day showing no evidence of superficial venous thrombosis.  There was evidence of reflux in the common femoral vein on the left lower extremity as well as great saphenous vein at the proximal mid thigh of the left lower extremity.  There was reflux in the right lower extremity at the saphenofemoral junction of her great saphenous vein as well as in the great saphenous vein at mid thigh and knee.  Decision made to stop the Xarelto as superficial thrombosis, recommended compression stockings with long plane rides or prolonged travel - along with ASA. Exercise along with compression stockings for chronic venous insufficiency and varicose veins.  Option of laser ablation was discussed for varicose veins. Feels like less random pain in legs off Xarelto. Some tightness behind knee at times - no knee pain/instability or injury. Able to walk without difficulty. 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   Asthma: Mild intermittent. Has used albuterol as  need in past. Some spices, gum chewing at times can be triggers, hay, dust at times. Last used albuterol 3 months ago. Needs refill - current albuterol expired.   Allergic rhinitis: Some allergens as above. More in spring - uses benadryl as needed. Allegra ok in past. Singulair in past as well. Doing well currently.    Patient Active Problem List   Diagnosis Date Noted  . 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 Chronic venous insufficiency 04/06/2018  . Superficial thrombosis of lower extremity, right 11/02/2017  . Bilateral lower extremity edema 11/02/2017   Past Medical History:  Diagnosis Date  . Allergy   . DVT (deep venous thrombosis) (HCC)   . Peripheral vascular disease [facility](HCC)    Past Surgical History:  Procedure Laterality Date  . ANAL FISTULECTOMY    . FINGER SURGERY     No Known Allergies Prior to Admission medications   Medication Sig Start Date End Date Taking? Authorizing Provider  albuterol (PROVENTIL HFA;VENTOLIN HFA) 108 (90 Base) MCG/ACT inhaler Inhale into the lungs every 6 (six) hours as needed for wheezing or shortness of breath.   Yes [provider]  Biotin 10 MG CAPS Take by mouth.   Yes [provider]   Social History   Socioeconomic History  . Marital status: Single    Spouse name: Not on file  . Number of children: Not on file  . Years of education: Not on file  . Highest education level: Not on file  Occupational History  . Not  on file  Social Needs  . Financial resource strain: Not on file  . Food insecurity:    Worry: Not on file    Inability: Not on file  . Transportation needs:    Medical: Not on file    Non-medical: Not on file  Tobacco Use  . Smoking status: Never Smoker  . Smokeless tobacco: Never Used  Substance and Sexual Activity  . Alcohol use: Yes    Comment: 1-2 times a month  . Drug use: No  . Sexual activity: Not on file  Lifestyle  . Physical activity:    Days per week: Not on file     Minutes per session: Not on file  . Stress: Not on file  Relationships  . Social connections:    Talks on phone: Not on file    Gets together: Not on file    Attends religious service: Not on file    Active member of club or organization: Not on file    Attends meetings of clubs or organizations: Not on file    Relationship status: Not on file  . Intimate partner violence:    Fear of current or ex partner: Not on file    Emotionally abused: Not on file    Physically abused: Not on file    Forced sexual activity: Not on file  Other Topics Concern  . Not on file  Social History Narrative  . Not on file    Review of Systems Per HPI.     Objective:   Physical Exam  Constitutional: She is oriented to person, place, and time. She appears well-developed and well-nourished. No distress.  HENT:  Head: Normocephalic and atraumatic.  Right Ear: Hearing, tympanic membrane, external ear and ear canal normal.  Left Ear: Hearing, tympanic membrane, external ear and ear canal normal.  Nose: Nose normal.  Mouth/Throat: Oropharynx is clear and moist. No oropharyngeal exudate.  Eyes: Pupils are equal, round, and reactive to light. Conjunctivae and EOM are normal.  Cardiovascular: Normal rate, regular rhythm, normal heart sounds and intact distal pulses.  No murmur heard. Pulmonary/Chest: Effort normal and breath sounds normal. No respiratory distress. She has no wheezes. She has no rhonchi.  Neurological: She is alert and oriented to person, place, and time.  Skin: Skin is warm and dry. No rash noted.  Psychiatric: She has a normal mood and affect. Her behavior is normal.  Vitals reviewed.   Vitals:   04/14/18 1705  BP: 112/72  Pulse: 74  Temp: 98.9 F (37.2 C)  TempSrc: Oral  SpO2: 96%  Weight: 282 lb (127.9 kg)  Height: 5\' 1"  (1.549 m)       Assessment & Plan:    Shastina Rua is a 42 y.o. female Mild intermittent asthma, unspecified whether complicated - Plan: albuterol  (PROVENTIL HFA;VENTOLIN HFA) 108 (90 Base) MCG/ACT inhaler  -Albuterol refilled for as needed use.  RTC precautions if increased need  Allergic rhinitis, unspecified seasonality, unspecified trigger  -Steroid nasal spray discussed including correct technique, nonsedating antihistamine if needed.  Consider Singulair restart if her symptoms worsen  Superficial thrombosis of lower extremity, right  -Status post treatment as above with Xarelto.  Now off that medication, denies new symptoms.  Continue recommendations from vascular specialist including compressive stockings.  Also recommended frequent motion of ankle when seated to lessen stasis.  Follow-up with vascular specialist as planned, RTC precautions if new/worsening symptoms  Plans to return for physical and can review family history further at that  time as well as screening lab work.  Meds ordered this encounter  Medications  . albuterol (PROVENTIL HFA;VENTOLIN HFA) 108 (90 Base) MCG/ACT inhaler    Sig: Inhale 1-2 puffs into the lungs every 4 (four) hours as needed for wheezing or shortness of breath.    Dispense:  1 Inhaler    Refill:  0   Patient Instructions   flonase nasal spray and/or Allegra for allergies. Try ot use technique "nose to toes, near to ear" as we discussed.  Albuterol if needed for asthma, but return to discuss asthma if more frequent.   Thank you for coming in today.   Asthma, Adult Asthma is a recurring condition in which the airways tighten and narrow. Asthma can make it difficult to breathe. It can cause coughing, wheezing, and shortness of breath. Asthma episodes, also called asthma attacks, range from minor to life-threatening. Asthma cannot be cured, but medicines and lifestyle changes can help control it. What are the causes? Asthma is believed to be caused by inherited (genetic) and environmental factors, but its exact cause is unknown. Asthma may be triggered by allergens, lung infections, or irritants in  the air. Asthma triggers are different for each person. Common triggers include:  Animal dander.  Dust mites.  Cockroaches.  Pollen from trees or grass.  Mold.  Smoke.  Air pollutants such as dust, household cleaners, hair sprays, aerosol sprays, paint fumes, strong chemicals, or strong odors.  Cold air, weather changes, and winds (which increase molds and pollens in the air).  Strong emotional expressions such as crying or laughing hard.  Stress.  Certain medicines (such as aspirin) or types of drugs (such as beta-blockers).  Sulfites in foods and drinks. Foods and drinks that may contain sulfites include dried fruit, potato chips, and sparkling grape juice.  Infections or inflammatory conditions such as the flu, a cold, or an inflammation of the nasal membranes (rhinitis).  Gastroesophageal reflux disease (GERD).  Exercise or strenuous activity.  What are the signs or symptoms? Symptoms may occur immediately after asthma is triggered or many hours later. Symptoms include:  Wheezing.  Excessive nighttime or early morning coughing.  Frequent or severe coughing with a common cold.  Chest tightness.  Shortness of breath.  How is this diagnosed? The diagnosis of asthma is made by a review of your medical history and a physical exam. Tests may also be performed. These may include:  Lung function studies. These tests show how much air you breathe in and out.  Allergy tests.  Imaging tests such as X-rays.  How is this treated? Asthma cannot be cured, but it can usually be controlled. Treatment involves identifying and avoiding your asthma triggers. It also involves medicines. There are 2 classes of medicine used for asthma treatment:  Controller medicines. These prevent asthma symptoms from occurring. They are usually taken every day.  Reliever or rescue medicines. These quickly relieve asthma symptoms. They are used as needed and provide short-term  relief.  Your health care provider will help you create an asthma action plan. An asthma action plan is a written plan for managing and treating your asthma attacks. It includes a list of your asthma triggers and how they may be avoided. It also includes information on when medicines should be taken and when their dosage should be changed. An action plan may also involve the use of a device called a peak flow meter. A peak flow meter measures how well the lungs are working. It helps you monitor  your condition. Follow these instructions at home:  Take medicines only as directed by your health care provider. Speak with your health care provider if you have questions about how or when to take the medicines.  Use a peak flow meter as directed by your health care provider. Record and keep track of readings.  Understand and use the action plan to help minimize or stop an asthma attack without needing to seek medical care.  Control your home environment in the following ways to help prevent asthma attacks: ? Do not smoke. Avoid being exposed to secondhand smoke. ? Change your heating and air conditioning filter regularly. ? Limit your use of fireplaces and wood stoves. ? Get rid of pests (such as roaches and mice) and their droppings. ? Throw away plants if you see mold on them. ? Clean your floors and dust regularly. Use unscented cleaning products. ? Try to have someone else vacuum for you regularly. Stay out of rooms while they are being vacuumed and for a short while afterward. If you vacuum, use a dust mask from a hardware store, a double-layered or microfilter vacuum cleaner bag, or a vacuum cleaner with a HEPA filter. ? Replace carpet with wood, tile, or vinyl flooring. Carpet can trap dander and dust. ? Use allergy-proof pillows, mattress covers, and box spring covers. ? Wash bed sheets and blankets every week in hot water and dry them in a dryer. ? Use blankets that are made of polyester or  cotton. ? Clean bathrooms and kitchens with bleach. If possible, have someone repaint the walls in these rooms with mold-resistant paint. Keep out of the rooms that are being cleaned and painted. ? Wash hands frequently. Contact a health care provider if:  You have wheezing, shortness of breath, or a cough even if taking medicine to prevent attacks.  The colored mucus you cough up (sputum) is thicker than usual.  Your sputum changes from clear or white to yellow, green, gray, or bloody.  You have any problems that may be related to the medicines you are taking (such as a rash, itching, swelling, or trouble breathing).  You are using a reliever medicine more than 2-3 times per week.  Your peak flow is still at 50-79% of your personal best after following your action plan for 1 hour.  You have a fever. Get help right away if:  You seem to be getting worse and are unresponsive to treatment during an asthma attack.  You are short of breath even at rest.  You get short of breath when doing very little physical activity.  You have difficulty eating, drinking, or talking due to asthma symptoms.  You develop chest pain.  You develop a fast heartbeat.  You have a bluish color to your lips or fingernails.  You are light-headed, dizzy, or faint.  Your peak flow is less than 50% of your personal best. This information is not intended to replace advice given to you by your health care provider. Make sure you discuss any questions you have with your health care provider. Document Released: 08/10/2005 Document Revised: 01/22/2016 Document Reviewed: 03/09/2013 Elsevier Interactive Patient Education  2017 [employer] Allergic Rhinitis, Adult Allergic rhinitis is an allergic reaction that affects the mucous membrane inside the nose. It causes sneezing, a runny or stuffy nose, and the feeling of mucus going down the back of the throat (postnasal drip). Allergic rhinitis can be mild to  severe. There are two types of allergic rhinitis:  Seasonal. This  type is also called hay fever. It happens only during certain seasons.  Perennial. This type can happen at any time of the year.  What are the causes? This condition happens when the body's defense system (immune system) responds to certain harmless substances called allergens as though they were germs.  Seasonal allergic rhinitis is triggered by pollen, which can come from grasses, trees, and weeds. Perennial allergic rhinitis may be caused by:  House dust mites.  Pet dander.  Mold spores.  What are the signs or symptoms? Symptoms of this condition include:  Sneezing.  Runny or stuffy nose (nasal congestion).  Postnasal drip.  Itchy nose.  Tearing of the eyes.  Trouble sleeping.  Daytime sleepiness.  How is this diagnosed? This condition may be diagnosed based on:  Your medical history.  A physical exam.  Tests to check for related conditions, such as: ? Asthma. ? Pink eye. ? Ear infection. ? Upper respiratory infection.  Tests to find out which allergens trigger your symptoms. These may include skin or blood tests.  How is this treated? There is no cure for this condition, but treatment can help control symptoms. Treatment may include:  Taking medicines that block allergy symptoms, such as antihistamines. Medicine may be given as a shot, nasal spray, or pill.  Avoiding the allergen.  Desensitization. This treatment involves getting ongoing shots until your body becomes less sensitive to the allergen. This treatment may be done if other treatments do not help.  If taking medicine and avoiding the allergen does not work, new, stronger medicines may be prescribed.  Follow these instructions at home:  Find out what you are allergic to. Common allergens include smoke, dust, and pollen.  Avoid the things you are allergic to. These are some things you can do to help avoid allergens: ? Replace  carpet with wood, tile, or vinyl flooring. Carpet can trap dander and dust. ? Do not smoke. Do not allow smoking in your home. ? Change your heating and air conditioning filter at least once a month. ? During allergy season:  Keep windows closed as much as possible.  Plan outdoor activities when pollen counts are lowest. This is usually during the evening hours.  When coming indoors, change clothing and shower before sitting on furniture or bedding.  Take over-the-counter and prescription medicines only as told by your health care provider.  Keep all follow-up visits as told by your health care provider. This is important. Contact a health care provider if:  You have a fever.  You develop a persistent cough.  You make whistling sounds when you breathe (you wheeze).  Your symptoms interfere with your normal daily activities. Get help right away if:  You have shortness of breath. Summary  This condition can be managed by taking medicines as directed and avoiding allergens.  Contact your health care provider if you develop a persistent cough or fever.  During allergy season, keep windows closed as much as possible. This information is not intended to replace advice given to you by your health care provider. Make sure you discuss any questions you have with your health care provider. Document Released: 05/05/2001 Document Revised: 09/17/2016 Document Reviewed: 09/17/2016 Elsevier Interactive Patient Education  [employer].   If you have lab work done today you will be contacted with your lab results within the next 2 weeks.  If you have not heard from [place] then please contact us. The fastest way to get your results is to register for  My Chart.   IF you received an x-ray today, you will receive an invoice from [facility]. Please contact [facility] at [phone] with questions or concerns regarding your invoice.   IF you received labwork today, you will  receive an invoice from [place]. Please contact [facility] at [phone] with questions or concerns regarding your invoice.   Our billing staff will not be able to assist you with questions regarding bills from these companies.  You will be contacted with the lab results as soon as they are available. The fastest way to get your results is to activate your My Chart account. Instructions are located on the last page of this paperwork. If you have not heard from [place] regarding the results in 2 weeks, please contact this office.       Signed,   Meredith Staggers, MD Primary [facility] at [facility].  04/14/18 6:55 PM

## 2018-05-06 LAB — HM MAMMOGRAPHY

## 2018-05-17 ENCOUNTER — Ambulatory Visit: Payer: Self-pay | Admitting: Registered Nurse

## 2018-05-17 ENCOUNTER — Encounter: Payer: Self-pay | Admitting: Registered Nurse

## 2018-05-17 VITALS — BP 112/72 | HR 66 | Temp 98.4°F

## 2018-05-17 DIAGNOSIS — J45909 Unspecified asthma, uncomplicated: Secondary | ICD-10-CM | POA: Insufficient documentation

## 2018-05-17 DIAGNOSIS — L309 Dermatitis, unspecified: Secondary | ICD-10-CM

## 2018-05-17 DIAGNOSIS — J302 Other seasonal allergic rhinitis: Secondary | ICD-10-CM | POA: Insufficient documentation

## 2018-05-17 DIAGNOSIS — E669 Obesity, unspecified: Secondary | ICD-10-CM | POA: Insufficient documentation

## 2018-05-17 MED ORDER — HYDROCORTISONE 1 % EX LOTN: 1.0000 "application " | TOPICAL_LOTION | Freq: Two times a day (BID) | CUTANEOUS | 0 refills | Status: DC

## 2018-05-17 MED ORDER — BACITRACIN-NEOMYCIN-POLYMYXIN 400-5-5000 EX OINT: 1.0000 "application " | TOPICAL_OINTMENT | Freq: Two times a day (BID) | CUTANEOUS | 0 refills | Status: AC

## 2018-05-17 MED ORDER — DIPHENHYDRAMINE HCL 2 % EX GEL: 1.0000 "application " | Freq: Two times a day (BID) | CUTANEOUS | 0 refills | Status: AC | PRN

## 2018-05-17 NOTE — Progress Notes (Signed)
Subjective:    Patient ID: Danielle Cooke, female    DOB: 12/14/1975, 42 y.o.   MRN: [id number]  42y/o Caucasian established female pt c/o red, pruritic rash to L upper thigh x1 week. States it started as small as a pimple, but over the past 4 days has progressively worsened to diameter of approx 1.5in. States rash is itchy, red, scaly, dry. One satellite lesion noted just medial to larger lesion but much smaller approx dime size. She reports that one being non-bothersome. She has been using Tinactin on sites x4 days with only worsening change only to larger lesion. No change to satellite lesion.  Switched to OTC cortisone 10 and helped.  Last time satellite lesion applied lotions and it resolved hasn't been applying lotions this time.  PMHx obesity and DVT     Review of Systems  Constitutional: Negative for activity change, appetite change, chills, diaphoresis, fatigue and fever.  HENT: Negative for trouble swallowing and voice change.   Eyes: Negative for photophobia and visual disturbance.  Respiratory: Negative for cough, shortness of breath and wheezing.   Cardiovascular: Negative for leg swelling.  Gastrointestinal: Negative for diarrhea, nausea and vomiting.  Endocrine: Negative for cold intolerance and heat intolerance.  Genitourinary: Negative for difficulty urinating and dysuria.  Musculoskeletal: Negative for arthralgias, back pain, gait problem, joint swelling, myalgias, neck pain and neck stiffness.  Skin: Positive for color change and rash. Negative for pallor and wound.  Allergic/Immunologic: Positive for environmental allergies. Negative for food allergies.  Neurological: Negative for headaches.  Hematological: Negative for adenopathy. Does not bruise/bleed easily.  Psychiatric/Behavioral: Negative for agitation, confusion and sleep disturbance.       Objective:   Physical Exam  Constitutional: She is oriented to person, place, and time. Vital signs are normal. She  appears well-developed and well-nourished. No distress.  HENT:  Head: Normocephalic and atraumatic.  Right Ear: External ear normal.  Left Ear: External ear normal.  Nose: Nose normal.  Mouth/Throat: Oropharynx is clear and moist. No oropharyngeal exudate.  Eyes: Pupils are equal, round, and reactive to light. Conjunctivae, EOM and lids are normal. Right eye exhibits no discharge. Left eye exhibits no discharge. No scleral icterus.  Neck: Trachea normal and normal range of motion. Neck supple. No tracheal deviation present.  Cardiovascular: Normal rate, regular rhythm, normal heart sounds and intact distal pulses.  Pulmonary/Chest: Effort normal and breath sounds normal.  Abdominal: Soft. There is no guarding.  Musculoskeletal: Normal range of motion. She exhibits no edema, tenderness or deformity.       Right shoulder: Normal.       Left shoulder: Normal.       Right elbow: Normal.      Left elbow: Normal.       Right hip: Normal.       Left hip: Normal.       Right knee: Normal.       Left knee: Normal.       Right ankle: Normal.       Left ankle: Normal.       Cervical back: Normal.       Thoracic back: Normal.       Lumbar back: Normal.       Right hand: Normal.       Left hand: Normal.       Right upper leg: She exhibits no tenderness, no bony tenderness, no swelling, no edema, no deformity and no laceration.       Left upper leg:  She exhibits no tenderness, no bony tenderness, no swelling, no edema, no deformity and no laceration.  Lymphadenopathy:    She has no cervical adenopathy.  Neurological: She is alert and oriented to person, place, and time. No sensory deficit. She exhibits normal muscle tone. Coordination normal.  Skin: Skin is warm, dry and intact. Capillary refill takes less than 2 seconds. Rash noted. No abrasion, no bruising, no burn, no ecchymosis, no laceration, no lesion, no petechiae and no purpura noted. Rash is macular, papular and maculopapular. Rash is not  nodular, not pustular, not vesicular and not urticarial. She is not diaphoretic. There is erythema. No cyanosis. No pallor. Nails show no clubbing.     Did not fluoresce under black light  Psychiatric: She has a normal mood and affect. Her speech is normal and behavior is normal. Judgment and thought content normal. She is not actively hallucinating. Cognition and memory are normal. She is attentive.  Nursing note and vitals reviewed.         Assessment & Plan:  A-dermatitis left anterior thigh initial visit  P-Medication as directed. Alternate triple antibiotic topical BID x 7 days with cortisone 10 or hydrocortisone 1% topical  BID x 7 days Given UD from clinic stock.  Wash area with soap and water daily.  Given 5 bandaids to cover area as clothes rubbing area also to cover after applying cream.  Monitor for signs of infection eg streaking, purulent discharge, swelling/pain.  May apply calagel 2%, ice pack for itching prn QID and take OTC antihistamine po daily prn itching patient has zyrtec/clariting/benadryl at home. Discussed steroids on fungal infection typically worsens symptoms and did not fluoresce under black light I do not believe this is ringworm (fungal).  Avoid scratching as can lead to secondary infection.   Symptomatic therapy suggested. Warm to cool water soaks and/or oatmeal baths. Call or return to clinic as needed if these symptoms worsen or fail to improve as anticipated. Exitcare handout on dermatitis given to patient. Exitcare handout atopic dermatitis and cellulitis.  Patient verbalized agreement and understanding of treatment plan and had no further questions at this time..  P2: Avoidance and hand washing.

## 2018-05-17 NOTE — Patient Instructions (Signed)
Cellulitis, Adult Cellulitis is a skin infection. The infected area is usually red and tender. This condition occurs most often in the arms and lower legs. The infection can travel to the muscles, blood, and underlying tissue and become serious. It is very important to get treated for this condition. What are the causes? Cellulitis is caused by bacteria. The bacteria enter through a break in the skin, such as a cut, burn, insect bite, open sore, or crack. What increases the risk? This condition is more likely to occur in people who:  Have a weak defense system (immune system).  Have open wounds on the skin such as cuts, burns, bites, and scrapes. Bacteria can enter the body through these open wounds.  Are older.  Have diabetes.  Have a type of long-lasting (chronic) liver disease (cirrhosis) or kidney disease.  Use IV drugs.  What are the signs or symptoms? Symptoms of this condition include:  Redness, streaking, or spotting on the skin.  Swollen area of the skin.  Tenderness or pain when an area of the skin is touched.  Warm skin.  Fever.  Chills.  Blisters.  How is this diagnosed? This condition is diagnosed based on a medical history and physical exam. You may also have tests, including:  Blood tests.  Lab tests.  Imaging tests.  How is this treated? Treatment for this condition may include:  Medicines, such as antibiotic medicines or antihistamines.  Supportive care, such as rest and application of cold or warm cloths (cold or warm compresses) to the skin.  Hospital care, if the condition is severe.  The infection usually gets better within 1-2 days of treatment. Follow these instructions at home:  Take over-the-counter and prescription medicines only as told by your health care provider.  If you were prescribed an antibiotic medicine, take it as told by your health care provider. Do not stop taking the antibiotic even if you start to feel  better.  Drink enough fluid to keep your urine clear or pale yellow.  Do not touch or rub the infected area.  Raise (elevate) the infected area above the level of your heart while you are sitting or lying down.  Apply warm or cold compresses to the affected area as told by your health care provider.  Keep all follow-up visits as told by your health care provider. This is important. These visits let your health care provider make sure a more serious infection is not developing. Contact a health care provider if:  You have a fever.  Your symptoms do not improve within 1-2 days of starting treatment.  Your bone or joint underneath the infected area becomes painful after the skin has healed.  Your infection returns in the same area or another area.  You notice a swollen bump in the infected area.  You develop new symptoms.  You have a general ill feeling (malaise) with muscle aches and pains. Get help right away if:  Your symptoms get worse.  You feel very sleepy.  You develop vomiting or diarrhea that persists.  You notice red streaks coming from the infected area.  Your red area gets larger or turns dark in color. This information is not intended to replace advice given to you by your health care provider. Make sure you discuss any questions you have with your health care provider. Document Released: 05/20/2005 Document Revised: 12/19/2015 Document Reviewed: 06/19/2015 Elsevier Interactive Patient Education  2018 [employer]. Atopic Dermatitis Atopic dermatitis is a skin disorder that causes  inflammation of the skin. This is the most common type of eczema. Eczema is a group of skin conditions that cause the skin to be itchy, red, and swollen. This condition is generally worse during the cooler winter months and often improves during the warm summer months. Symptoms can vary from person to person. Atopic dermatitis usually starts showing signs in infancy and can last through  adulthood. This condition cannot be passed from one person to another (non-contagious), but it is more common in families. Atopic dermatitis may not always be present. When it is present, it is called a flare-up. What are the causes? The exact cause of this condition is not known. Flare-ups of the condition may be triggered by:  Contact with something that you are sensitive or allergic to.  Stress.  Certain foods.  Extremely hot or cold weather.  Harsh chemicals and soaps.  Dry air.  Chlorine.  What increases the risk? This condition is more likely to develop in people who have a personal history or family history of eczema, allergies, asthma, or hay fever. What are the signs or symptoms? Symptoms of this condition include:  Dry, scaly skin.  Red, itchy rash.  Itchiness, which can be severe. This may occur before the skin rash. This can make sleeping difficult.  Skin thickening and cracking that can occur over time.  How is this diagnosed? This condition is diagnosed based on your symptoms, a medical history, and a physical exam. How is this treated? There is no cure for this condition, but symptoms can usually be controlled. Treatment focuses on:  Controlling the itchiness and scratching. You may be given medicines, such as antihistamines or steroid creams.  Limiting exposure to things that you are sensitive or allergic to (allergens).  Recognizing situations that cause stress and developing a plan to manage stress.  If your atopic dermatitis does not get better with medicines, or if it is all over your body (widespread), a treatment using a specific type of light (phototherapy) may be used. Follow these instructions at home: Skin care  Keep your skin well-moisturized. Doing this seals in moisture and helps to prevent dryness. ? Use unscented lotions that have petroleum in them. ? Avoid lotions that contain alcohol or water. They can dry the skin.  Keep baths or  showers short (less than 5 minutes) in warm water. Do not use hot water. ? Use mild, unscented cleansers for bathing. Avoid soap and bubble bath. ? Apply a moisturizer to your skin right after a bath or shower.  Do not apply anything to your skin without checking with your health care provider. General instructions  Dress in clothes made of cotton or cotton blends. Dress lightly because heat increases itchiness.  When washing your clothes, rinse your clothes twice so all of the soap is removed.  Avoid any triggers that can cause a flare-up.  Try to manage your stress.  Keep your fingernails cut short.  Avoid scratching. Scratching makes the rash and itchiness worse. It may also result in a skin infection (impetigo) due to a break in the skin caused by scratching.  Take or apply over-the-counter and prescription medicines only as told by your health care provider.  Keep all follow-up visits as told by your health care provider. This is important.  Do not be around people who have cold sores or fever blisters. If you get the infection, it may cause your atopic dermatitis to worsen. Contact a health care provider if:  Your itchiness interferes  with sleep.  Your rash gets worse or it is not better within one week of starting treatment.  You have a fever.  You have a rash flare-up after having contact with someone who has cold sores or fever blisters. Get help right away if:  You develop pus or soft yellow scabs in the rash area. Summary  This condition causes a red rash and itchy, dry, scaly skin.  Treatment focuses on controlling the itchiness and scratching, limiting exposure to things that you are sensitive or allergic to (allergens), recognizing situations that cause stress, and developing a plan to manage stress.  Keep your skin well-moisturized.  Keep baths or showers shorter than 5 minutes and use warm water. Do not use hot water. This information is not intended to  replace advice given to you by your health care provider. Make sure you discuss any questions you have with your health care provider. Document Released: 08/07/2000 Document Revised: 09/11/2016 Document Reviewed: 09/11/2016 Elsevier Interactive Patient Education  [employer].

## 2018-05-20 ENCOUNTER — Telehealth: Payer: Self-pay | Admitting: *Deleted

## 2018-05-20 MED ORDER — TRIAMCINOLONE ACETONIDE 0.1 % EX CREA: 1.0000 "application " | TOPICAL_CREAM | Freq: Two times a day (BID) | CUTANEOUS | 0 refills | Status: DC

## 2018-05-20 NOTE — Telephone Encounter (Signed)
Pt follows up from 9/24 NP appt and reports that with alternating cortisone and triple abx creams and keeping the site covered to avoid clothes rubbing, the smaller satellite lesion is almost resolved. However the larger main lesion is less red, but still the same size and very itchy and dry.  Denies warmth, open wound/drainage, or red streaking at either site.   Preferred pharmacy [facility] at [facility].

## 2018-05-20 NOTE — Telephone Encounter (Signed)
Noted will increase strength of topical steroid to triamcinolone 0.1% BID x 7-14 days.  Consider zyrtec or claritin 10mg  po daily prn itching.  Electronic Rx sent to patient pharmacy of choice and she is to folllow up if erythema/purulent discharge or no resolution of lesion with plan of care.

## 2018-05-23 NOTE — Progress Notes (Signed)
Late entry: Reviewed add-on A1c result with pt 03/24/18 via phone. Reinforced diet and exercise recommendations that were previously reviewed with pt and urgent need to establish primary care.   On 04/12/18, pt informed RN that she had made appt with [facility] for 8/22. Pt did attend that appt.  At 05/17/18 NP clinic appt, she informed staff that she has also seen Vascular since labs were drawn and DVT has resolved and she has been taken off anticoag.

## 2018-05-24 ENCOUNTER — Encounter: Payer: Self-pay | Admitting: Family Medicine

## 2018-05-26 ENCOUNTER — Telehealth: Payer: Self-pay | Admitting: *Deleted

## 2018-05-26 NOTE — Telephone Encounter (Signed)
noted 

## 2018-05-26 NOTE — Telephone Encounter (Signed)
Pt made aware of plan and verbalizes agreement.

## 2018-05-26 NOTE — Telephone Encounter (Signed)
Pt calls to report status of lesions after starting Kenalog 05/20/18. She sts smaller satellite lesion is almost completely resolved. Larger main lesion was at half dollar size, now approx circumference of 3-4in. However, was a thick plaque that is now much thinner, basically non-raised. She sts redness is reduced and is only around the outer edge of lesion. Pruritis significantly decreased. No new concerns.

## 2018-05-26 NOTE — Telephone Encounter (Signed)
Noted if not resolved by next week need follow up/re-evaluation in clinic.

## 2018-05-27 ENCOUNTER — Encounter: Payer: Self-pay | Admitting: Family Medicine

## 2018-05-27 ENCOUNTER — Ambulatory Visit (INDEPENDENT_AMBULATORY_CARE_PROVIDER_SITE_OTHER): Payer: No Typology Code available for payment source | Admitting: Family Medicine

## 2018-05-27 ENCOUNTER — Other Ambulatory Visit: Payer: Self-pay

## 2018-05-27 VITALS — BP 126/70 | HR 71 | Temp 98.2°F | Resp 18 | Ht 60.43 in | Wt 276.8 lb

## 2018-05-27 DIAGNOSIS — R609 Edema, unspecified: Secondary | ICD-10-CM

## 2018-05-27 DIAGNOSIS — Z0000 Encounter for general adult medical examination without abnormal findings: Secondary | ICD-10-CM

## 2018-05-27 DIAGNOSIS — Z124 Encounter for screening for malignant neoplasm of cervix: Secondary | ICD-10-CM | POA: Diagnosis not present

## 2018-05-27 DIAGNOSIS — J309 Allergic rhinitis, unspecified: Secondary | ICD-10-CM | POA: Diagnosis not present

## 2018-05-27 DIAGNOSIS — Z23 Encounter for immunization: Secondary | ICD-10-CM | POA: Diagnosis not present

## 2018-05-27 DIAGNOSIS — R21 Rash and other nonspecific skin eruption: Secondary | ICD-10-CM | POA: Diagnosis not present

## 2018-05-27 DIAGNOSIS — J4520 Mild intermittent asthma, uncomplicated: Secondary | ICD-10-CM

## 2018-05-27 DIAGNOSIS — R739 Hyperglycemia, unspecified: Secondary | ICD-10-CM | POA: Diagnosis not present

## 2018-05-27 MED ORDER — CLOTRIMAZOLE-BETAMETHASONE 1-0.05 % EX CREA: 1.0000 "application " | TOPICAL_CREAM | Freq: Two times a day (BID) | CUTANEOUS | 0 refills | Status: DC

## 2018-05-27 NOTE — Patient Instructions (Addendum)
Continue compression stockings for leg swelling.   If rash/wounds in mouth return - please follow up to look into cause/treatment.   Please follow up to discuss back pain further if that persists and we can discuss specialist locally if needed.   Return to the clinic or go to the nearest emergency room if any of your symptoms worsen or new symptoms occur.  Rash on upper thigh still may be due to fungal infection based on appearance and enlarging. Try lotrisone cream twice per day. Stop if worsening. Recheck if not improving in next 2 weeks.    Blood sugar in July was at diabetes level. I would consider medication once per day, but work on diet and increased exercise for now, with repeat testing in 1 month.   [facility]  Address: [address] [place], [place] [address]  [email or web address]  Phone: [phone]  [facility]  Address: [address], [place], [place] [address] Phone: [phone]  I will refer you to OBGYN for establishing care, pap testing can be performed at their office.   [facility]: [phone]  Diabetes Mellitus and Nutrition When you have diabetes (diabetes mellitus), it is very important to have healthy eating habits because your blood sugar (glucose) levels are greatly affected by what you eat and drink. Eating healthy foods in the appropriate amounts, at about the same times every day, can help you:  Control your blood glucose.  Lower your risk of heart disease.  Improve your blood pressure.  Reach or maintain a healthy weight.  Every person with diabetes is different, and each person has different needs for a meal plan. Your health care provider may recommend that you work with a diet and nutrition specialist (dietitian) to make a meal plan that is best for you. Your meal plan may vary depending on factors such as:  The calories you need.  The medicines you take.  Your weight.  Your blood  glucose, blood pressure, and cholesterol levels.  Your activity level.  Other health conditions you have, such as heart or kidney disease.  How do carbohydrates affect me? Carbohydrates affect your blood glucose level more than any other type of food. Eating carbohydrates naturally increases the amount of glucose in your blood. Carbohydrate counting is a method for keeping track of how many carbohydrates you eat. Counting carbohydrates is important to keep your blood glucose at a healthy level, especially if you use insulin or take certain oral diabetes medicines. It is important to know how many carbohydrates you can safely have in each meal. This is different for every person. Your dietitian can help you calculate how many carbohydrates you should have at each meal and for snack. Foods that contain carbohydrates include:  Bread, cereal, rice, pasta, and crackers.  Potatoes and corn.  Peas, beans, and lentils.  Milk and yogurt.  Fruit and juice.  Desserts, such as cakes, cookies, ice cream, and candy.  How does alcohol affect me? Alcohol can cause a sudden decrease in blood glucose (hypoglycemia), especially if you use insulin or take certain oral diabetes medicines. Hypoglycemia can be a life-threatening condition. Symptoms of hypoglycemia (sleepiness, dizziness, and confusion) are similar to symptoms of having too much alcohol. If your health care provider says that alcohol is safe for you, follow these guidelines:  Limit alcohol intake to no more than 1 drink per day for nonpregnant women and 2 drinks per day for men. One drink equals 12 oz of beer,  5 oz of wine, or 1 oz of hard liquor.  Do not drink on an empty stomach.  Keep yourself hydrated with water, diet soda, or unsweetened iced tea.  Keep in mind that regular soda, juice, and other mixers may contain Rance lot of sugar and must be counted as carbohydrates.  What are tips for following this plan? Reading food  labels  Start by checking the serving size on the label. The amount of calories, carbohydrates, fats, and other nutrients listed on the label are based on one serving of the food. Many foods contain more than one serving per package.  Check the total grams (g) of carbohydrates in one serving. You can calculate the number of servings of carbohydrates in one serving by dividing the total carbohydrates by 15. For example, if [place] food has 30 g of total carbohydrates, it would be equal to 2 servings of carbohydrates.  Check the number of grams (g) of saturated and trans fats in one serving. Choose foods that have low or no amount of these fats.  Check the number of milligrams (mg) of sodium in one serving. Most people should limit total sodium intake to less than 2,300 mg per day.  Always check the nutrition information of foods labeled as "low-fat" or "nonfat". These foods may be higher in added sugar or refined carbohydrates and should be avoided.  Talk to your dietitian to identify your daily goals for nutrients listed on the label. Shopping  Avoid buying canned, premade, or processed foods. These foods tend to be high in fat, sodium, and added sugar.  Shop around the outside edge of the grocery store. This includes fresh fruits and vegetables, bulk grains, fresh meats, and fresh dairy. Cooking  Use low-heat cooking methods, such as baking, instead of high-heat cooking methods like deep frying.  Cook using healthy oils, such as olive, canola, or sunflower oil.  Avoid cooking with butter, cream, or high-fat meats. Meal planning  Eat meals and snacks regularly, preferably at the same times every day. Avoid going long periods of time without eating.  Eat foods high in fiber, such as fresh fruits, vegetables, beans, and whole grains. Talk to your dietitian about how many servings of carbohydrates you can eat at each meal.  Eat 4-6 ounces of lean protein each day, such as lean meat, chicken,  fish, eggs, or tofu. 1 ounce is equal to 1 ounce of meat, chicken, or fish, 1 egg, or 1/4 cup of tofu.  Eat some foods each day that contain healthy fats, such as avocado, nuts, seeds, and fish. Lifestyle   Check your blood glucose regularly.  Exercise at least 30 minutes 5 or more days each week, or as told by your health care provider.  Take medicines as told by your health care provider.  Do not use any products that contain nicotine or tobacco, such as cigarettes and e-cigarettes. If you need help quitting, ask your health care provider.  Work with [facility] Veter[employer] surgeoncounselor or diabetes educator to identify strategies to manage stress and any emotional and social challenges. What are some questions to ask my health care provider?  Do I need to meet with [place] diabetes educator?  Do I need to meet with [place] dietitian?  What number can I call if I have questions?  When are the best times to check my blood glucose? Where to find more information:  American Diabetes Association: diabetes.org/food-and-fitness/food  Academy of Nutrition and Dietetics: https://www.vargas.com/www.eatright.org/resources/health/diseases-and-conditions/diabetes  [employer] of Diabetes and Digestive  and Kidney Diseases (NIH): FindJewelers.cz Summary  A healthy meal plan will help you control your blood glucose and maintain a healthy lifestyle.  Working with a diet and nutrition specialist (dietitian) can help you make a meal plan that is best for you.  Keep in mind that carbohydrates and alcohol have immediate effects on your blood glucose levels. It is important to count carbohydrates and to use alcohol carefully. This information is not intended to replace advice given to you by your health care provider. Make sure you discuss any questions you have with your health care provider. Document Released: 05/07/2005 Document Revised: 09/14/2016 Document Reviewed:  09/14/2016 Elsevier Interactive Patient Education  [employer].    If you have lab work done today you will be contacted with your lab results within the next 2 weeks.  If you have not heard from [place] then please contact us. The fastest way to get your results is to register for My Chart.   IF you received an x-ray today, you will receive an invoice from [facility]. Please contact [facility] at [phone] with questions or concerns regarding your invoice.   IF you received labwork today, you will receive an invoice from [place]. Please contact [facility] at [phone] with questions or concerns regarding your invoice.   Our billing staff will not be able to assist you with questions regarding bills from these companies.  You will be contacted with the lab results as soon as they are available. The fastest way to get your results is to activate your My Chart account. Instructions are located on the last page of this paperwork. If you have not heard from [place] regarding the results in 2 weeks, please contact this office.

## 2018-05-27 NOTE — Progress Notes (Signed)
Subjective:  By signing my name below, I, Danielle Cooke, attest that this documentation has been prepared under the direction and in the presence of Shade Flood, MD Electronically Signed: Charline Bills, ED Scribe 05/27/2018 at 9:09 AM.   Patient ID: Danielle Cooke, female    DOB: 1975/11/14, 42 y.o.   MRN: [id number]  Chief Complaint  Patient presents with  . Annual Exam    pap    HPI Danielle Cooke is a 42 y.o. female who presents to Primary Care at [facility] for an annual exam. H/o mild intermittent asthma, chronic venous insufficiency with peripheral edema, obesity, DVT in March treated with xarelto.  H/o DVT Prev treated with xarelto. Seen by vascular in Aug. Prev DVT was large superficial. Saphenous clot close to femoral. Venous study 8/14, no evidence of thrombosis but did have venous reflux. Xarelto was stopped with plan for compression stockings with long plane rides/travel. [place] qd.  Asthma Mild, intermittent. Albuterol prn. Controlled at 8/22 OV.  Allergic Rhinitis  Spring time. OTC Allegra and Singulair. - Pt reports some congestion, rhinorrhea and sinus pain that has improved with Flonase. Also reports occasional mouth sores when she eats too many chips, none at this time.  Back Pain Reports chronic joint and back pain that she attributes to lumbar radiculopathy. She had a back specialist in [place] with [facility] but she does not have a local specialist.  Rash Pt reports a rash to the inner thigh since 9/19. States rash started as what she thought was a bug bite prior to seeing a nurse 4 days later. Reports that the rash was evaluated under a black light and determined not to be ring worm. Pt used antifungal x 4 days and was started on triamcinolone cream 1 wk ago which she states expanded the rash but has improved overall appearance. Denies fever, chills, flu-like symptoms, tick bite.  CA Screening Breast CA Screening: mammogram 05/06/18, fibroglandular densities, no  suspicious findings Cervical CA Screening: last pap unknown, pt suspects it has been more than 5 yrs. Prev followed by OBGYN for fibroids; needs a local OBGYN  Immunizations Immunization History  Administered Date(s) Administered  . Influenza,inj,Quad PF,6+ Mos 06/11/2017  Tdap: unknown, suspects it was more than 10 yrs ago Flu: plans to get it through work  Depression Screening Depression screen [facility] 2/9 05/27/2018 04/14/2018  Decreased Interest 0 0  Down, Depressed, Hopeless 0 0  PHQ - 2 Score 0 0     Visual Acuity Screening   Right eye Left eye Both eyes  Without correction:     With correction: 20/25 20/40 20/25    Vision: wears glasses, does not have a local optometrist Dentist: none Exercise: walks her dog ~2 miles/day  Lipid Screening Lipid screening through work on 7/25. Lab Results  Component Value Date   CHOL 170 03/17/2018   HDL 39 (L) 03/17/2018   LDLCALC 116 (H) 03/17/2018   TRIG 76 03/17/2018   CHOLHDL 4.4 03/17/2018   Hyperglycemia Most recent A1C at DM level in July. Had been 6.6 prev. - Pt walks her dog ~2 miles daily but no other exercise. She is eating out daily for lunch. Lab Results  Component Value Date   HGBA1C 6.8 (H) 03/17/2018   Wt Readings from Last 3 Encounters:  05/27/18 276 lb 12.8 oz (125.6 kg)  04/14/18 282 lb (127.9 kg)  04/06/18 276 lb (125.2 kg)    Patient Active Problem List   Diagnosis Date Noted  . Asthma without  status asthmaticus 05/17/2018  . Obesity 05/17/2018  . Seasonal allergic rhinitis 05/17/2018  . 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 Chronic venous insufficiency 04/06/2018  . Superficial thrombosis of lower extremity, right 11/02/2017  . Bilateral lower extremity edema 11/02/2017  . Low serum HDL 11/27/2014  . Prediabetes 11/27/2014  . Vitamin D deficiency 11/27/2014   Past Medical History:  Diagnosis Date  . Allergy   . DVT (deep venous thrombosis) (HCC)   . Peripheral vascular disease  [facility])    Past Surgical History:  Procedure Laterality Date  . ANAL FISTULECTOMY    . FINGER SURGERY     No Known Allergies Prior to Admission medications   Medication Sig Start Date End Date Taking? Authorizing Provider  albuterol (PROVENTIL HFA;VENTOLIN HFA) 108 (90 Base) MCG/ACT inhaler Inhale 1-2 puffs into the lungs every 4 (four) hours as needed for wheezing or shortness of breath. 04/14/18   Shade Flood, MD  Biotin 10 MG CAPS Take by mouth.    [provider]  triamcinolone cream (KENALOG) 0.1 % Apply 1 application topically 2 (two) times daily. 05/20/18   Betancourt, Jarold Song, NP   Social History   Socioeconomic History  . Marital status: Single    Spouse name: Not on file  . Number of children: Not on file  . Years of education: Not on file  . Highest education level: Not on file  Occupational History  . Not on file  Social Needs  . Financial resource strain: Not on file  . Food insecurity:    Worry: Not on file    Inability: Not on file  . Transportation needs:    Medical: Not on file    Non-medical: Not on file  Tobacco Use  . Smoking status: Never Smoker  . Smokeless tobacco: Never Used  Substance and Sexual Activity  . Alcohol use: Yes    Comment: 1-2 times a month  . Drug use: No  . Sexual activity: Not on file  Lifestyle  . Physical activity:    Days per week: Not on file    Minutes per session: Not on file  . Stress: Not on file  Relationships  . Social connections:    Talks on phone: Not on file    Gets together: Not on file    Attends religious service: Not on file    Active member of club or organization: Not on file    Attends meetings of clubs or organizations: Not on file    Relationship status: Not on file  . Intimate partner violence:    Fear of current or ex partner: Not on file    Emotionally abused: Not on file    Physically abused: Not on file    Forced sexual activity: Not on file  Other Topics Concern  . Not on file    Social History Narrative  . Not on file   Review of Systems  Constitutional: Negative for chills and fever.  HENT: Positive for congestion, mouth sores, rhinorrhea and sinus pain.   Musculoskeletal: Positive for arthralgias and back pain.  Skin: Positive for rash.      Objective:   Physical Exam  Constitutional: She is oriented to person, place, and time. She appears well-developed and well-nourished.  HENT:  Head: Normocephalic and atraumatic.  Right Ear: External ear normal.  Left Ear: External ear normal.  Mouth/Throat: Oropharynx is clear and moist. No oral lesions.  Eyes: Pupils are equal, round, and reactive to  light. Conjunctivae are normal.  Neck: Normal range of motion. Neck supple. No thyromegaly present.  Cardiovascular: Normal rate, regular rhythm, normal heart sounds and intact distal pulses.  No murmur heard. Pulmonary/Chest: Effort normal and breath sounds normal. No respiratory distress. She has no wheezes.  Abdominal: Soft. Bowel sounds are normal. There is no tenderness.  Musculoskeletal: Normal range of motion. She exhibits no edema or tenderness.  Lymphadenopathy:    She has no cervical adenopathy.  Neurological: She is alert and oriented to person, place, and time.  Skin: Skin is warm and dry. Rash noted.  Flat patch on anterior L thigh 8 cm x 4 cm with a slightly elevated border, slightly more erythema border. Poss small satellite lesion medial 1.5 x 1.4 cm.  Psychiatric: She has a normal mood and affect. Her behavior is normal. Thought content normal.   Vitals:   05/27/18 0827  BP: 126/70  Pulse: 71  Resp: 18  Temp: 98.2 F (36.8 C)  TempSrc: Oral  SpO2: 96%  Weight: 276 lb 12.8 oz (125.6 kg)  Height: 5' 0.43" (1.535 m)      Assessment & Plan:    Danielle Cooke is a 42 y.o. female Annual physical exam  - -anticipatory guidance as below in AVS, screening labs above. Health maintenance items as above in HPI discussed/recommended as applicable.    Hyperglycemia  - discussed A1c at DM level prior. Diet/exercise with repeat testing in 1 month.  Consider low dose metformin.   Rash and nonspecific skin eruption - Plan: clotrimazole-betamethasone (LOTRISONE) cream  - appears to be tinea corporis.   - trial of lotrisone, with RTC precauations. Consider bx or derm eval if persistent.   Need for Tdap vaccination - Plan: Tdap vaccine greater than or equal to 7yo IM   Screening for cervical cancer - Plan: Ambulatory referral to Gynecology  - option of testing in office, but has been followed by obgyn for this testing prior and prefers referral there. Referred.   Allergic rhinitis, unspecified seasonality, unspecified trigger  - stable.   Mild intermittent asthma without complication  - stable. No new meds.   Peripheral edema  - compression stockings recommended.   Meds ordered this encounter  Medications  . clotrimazole-betamethasone (LOTRISONE) cream    Sig: Apply 1 application topically 2 (two) times daily.    Dispense:  30 g    Refill:  0   Patient Instructions    Continue compression stockings for leg swelling.   If rash/wounds in mouth return - please follow up to look into cause/treatment.   Please follow up to discuss back pain further if that persists and we can discuss specialist locally if needed.   Return to the clinic or go to the nearest emergency room if any of your symptoms worsen or new symptoms occur.  Rash on upper thigh still may be due to fungal infection based on appearance and enlarging. Try lotrisone cream twice per day. Stop if worsening. Recheck if not improving in next 2 weeks.    Blood sugar in July was at diabetes level. I would consider medication once per day, but work on diet and increased exercise for now, with repeat testing in 1 month.   [facility]  Address: [address] [place], [place] [address]  [email or web address]  Phone: [phone]  [facility]   Address: [address] [place], [place] [address] Phone: [phone]  I will refer you to OBGYN for establishing care, pap testing can  be performed at their office.   [facility]: [phone]  Diabetes Mellitus and Nutrition When you have diabetes (diabetes mellitus), it is very important to have healthy eating habits because your blood sugar (glucose) levels are greatly affected by what you eat and drink. Eating healthy foods in the appropriate amounts, at about the same times every day, can help you:  Control your blood glucose.  Lower your risk of heart disease.  Improve your blood pressure.  Reach or maintain a healthy weight.  Every person with diabetes is different, and each person has different needs for a meal plan. Your health care provider may recommend that you work with a diet and nutrition specialist (dietitian) to make a meal plan that is best for you. Your meal plan may vary depending on factors such as:  The calories you need.  The medicines you take.  Your weight.  Your blood glucose, blood pressure, and cholesterol levels.  Your activity level.  Other health conditions you have, such as heart or kidney disease.  How do carbohydrates affect me? Carbohydrates affect your blood glucose level more than any other type of food. Eating carbohydrates naturally increases the amount of glucose in your blood. Carbohydrate counting is a method for keeping track of how many carbohydrates you eat. Counting carbohydrates is important to keep your blood glucose at a healthy level, especially if you use insulin or take certain oral diabetes medicines. It is important to know how many carbohydrates you can safely have in each meal. This is different for every person. Your dietitian can help you calculate how many carbohydrates you should have at each meal and for snack. Foods that contain carbohydrates include:  Bread, cereal, rice, pasta, and  crackers.  Potatoes and corn.  Peas, beans, and lentils.  Milk and yogurt.  Fruit and juice.  Desserts, such as cakes, cookies, ice cream, and candy.  How does alcohol affect me? Alcohol can cause a sudden decrease in blood glucose (hypoglycemia), especially if you use insulin or take certain oral diabetes medicines. Hypoglycemia can be a life-threatening condition. Symptoms of hypoglycemia (sleepiness, dizziness, and confusion) are similar to symptoms of having too much alcohol. If your health care provider says that alcohol is safe for you, follow these guidelines:  Limit alcohol intake to no more than 1 drink per day for nonpregnant women and 2 drinks per day for men. One drink equals 12 oz of beer, 5 oz of wine, or 1 oz of hard liquor.  Do not drink on an empty stomach.  Keep yourself hydrated with water, diet soda, or unsweetened iced tea.  Keep in mind that regular soda, juice, and other mixers may contain a lot of sugar and must be counted as carbohydrates.  What are tips for following this plan? Reading food labels  Start by checking the serving size on the label. The amount of calories, carbohydrates, fats, and other nutrients listed on the label are based on one serving of the food. Many foods contain more than one serving per package.  Check the total grams (g) of carbohydrates in one serving. You can calculate the number of servings of carbohydrates in one serving by dividing the total carbohydrates by 15. For example, if a food has 30 g of total carbohydrates, it would be equal to 2 servings of carbohydrates.  Check the number of grams (g) of saturated and trans fats in one serving. Choose foods that have low or no amount of these fats.  Check the number of milligrams (mg) of sodium in one serving. Most people should limit total sodium intake to less than 2,300 mg per day.  Always check the nutrition information of foods labeled as "low-fat" or "nonfat". These foods  may be higher in added sugar or refined carbohydrates and should be avoided.  Talk to your dietitian to identify your daily goals for nutrients listed on the label. Shopping  Avoid buying canned, premade, or processed foods. These foods tend to be high in fat, sodium, and added sugar.  Shop around the outside edge of the grocery store. This includes fresh fruits and vegetables, bulk grains, fresh meats, and fresh dairy. Cooking  Use low-heat cooking methods, such as baking, instead of high-heat cooking methods like deep frying.  Cook using healthy oils, such as olive, canola, or sunflower oil.  Avoid cooking with butter, cream, or high-fat meats. Meal planning  Eat meals and snacks regularly, preferably at the same times every day. Avoid going long periods of time without eating.  Eat foods high in fiber, such as fresh fruits, vegetables, beans, and whole grains. Talk to your dietitian about how many servings of carbohydrates you can eat at each meal.  Eat 4-6 ounces of lean protein each day, such as lean meat, chicken, fish, eggs, or tofu. 1 ounce is equal to 1 ounce of meat, chicken, or fish, 1 egg, or 1/4 cup of tofu.  Eat some foods each day that contain healthy fats, such as avocado, nuts, seeds, and fish. Lifestyle   Check your blood glucose regularly.  Exercise at least 30 minutes 5 or more days each week, or as told by your health care provider.  Take medicines as told by your health care provider.  Do not use any products that contain nicotine or tobacco, such as cigarettes and e-cigarettes. If you need help quitting, ask your health care provider.  Work with a Veterinary surgeon or diabetes educator to identify strategies to manage stress and any emotional and social challenges. What are some questions to ask my health care provider?  Do I need to meet with a diabetes educator?  Do I need to meet with a dietitian?  What number can I call if I have questions?  When are the  best times to check my blood glucose? Where to find more information:  American Diabetes Association: diabetes.org/food-and-fitness/food  Academy of Nutrition and Dietetics: https://www.vargas.com/  [employer] of Diabetes and Digestive and Kidney Diseases (NIH): FindJewelers.cz Summary  A healthy meal plan will help you control your blood glucose and maintain a healthy lifestyle.  Working with a diet and nutrition specialist (dietitian) can help you make a meal plan that is best for you.  Keep in mind that carbohydrates and alcohol have immediate effects on your blood glucose levels. It is important to count carbohydrates and to use alcohol carefully. This information is not intended to replace advice given to you by your health care provider. Make sure you discuss any questions you have with your health care provider. Document Released: 05/07/2005 Document Revised: 09/14/2016 Document Reviewed: 09/14/2016 Elsevier Interactive Patient Education  [employer].    If you have lab work done today you will be contacted with your lab results within the next 2 weeks.  If you have not heard from [place] then please contact us. The fastest way to get your results is to register for My Chart.   IF you received an x-ray today, you will receive an invoice from [facility].  Please contact [facility] at [phone] with questions or concerns regarding your invoice.   IF you received labwork today, you will receive an invoice from [facility]. Please contact [facility] at [phone] with questions or concerns regarding your invoice.   Our billing staff will not be able to assist you with questions regarding bills from these companies.  You will be contacted with the lab results as soon as they are available. The fastest way to get your results is to activate your  My Chart account. Instructions are located on the last page of this paperwork. If you have not heard from [place] regarding the results in 2 weeks, please contact this office.      I personally performed the services described in this documentation, which was scribed in my presence. The recorded information has been reviewed and considered for accuracy and completeness, addended by me as needed, and agree with information above.  Signed,   Meredith Staggers, MD Primary Care at [facility].  05/28/18 6:28 PM

## 2018-06-02 ENCOUNTER — Telehealth: Payer: Self-pay | Admitting: Registered Nurse

## 2018-06-02 ENCOUNTER — Encounter: Payer: Self-pay | Admitting: Registered Nurse

## 2018-06-02 NOTE — Telephone Encounter (Signed)
Late entry patient contacted clinic on 05/31/2018 stated she saw another provider who thought her rash was ringworm and that she should stop steroid cream and start antifungal.  Was given Rx by provider.  Patient feels rash improving with currently treatment from me triamcinolone and if okay to continue that treatment or she should switch to lotrisone from her other provider.  Discussed with patient if lesion getting less irritated and smaller amy continue triamcinolone 0.1% topical BID for another week.  Topical steroid on a fungal infection typically worsens symptoms e.g. Enlarging rash, itching and her itching has decreased and papules decreasing rash now macular on her thigh.  Follow up for re-evaluation if no resolution or worsening of symptoms thigh rash 1 week.  Patient verbalized understanding information/instructions, agreed with plan of care and had no further questions at this time.

## 2018-06-24 ENCOUNTER — Encounter: Payer: Self-pay | Admitting: Family Medicine

## 2018-06-24 ENCOUNTER — Ambulatory Visit: Payer: No Typology Code available for payment source | Admitting: Family Medicine

## 2018-06-24 ENCOUNTER — Other Ambulatory Visit: Payer: Self-pay

## 2018-06-24 VITALS — BP 119/84 | HR 61 | Temp 97.9°F | Ht 61.0 in | Wt 277.2 lb

## 2018-06-24 DIAGNOSIS — B354 Tinea corporis: Secondary | ICD-10-CM | POA: Diagnosis not present

## 2018-06-24 DIAGNOSIS — M79604 Pain in right leg: Secondary | ICD-10-CM | POA: Diagnosis not present

## 2018-06-24 DIAGNOSIS — I8001 Phlebitis and thrombophlebitis of superficial vessels of right lower extremity: Secondary | ICD-10-CM | POA: Diagnosis not present

## 2018-06-24 DIAGNOSIS — E119 Type 2 diabetes mellitus without complications: Secondary | ICD-10-CM

## 2018-06-24 NOTE — Patient Instructions (Addendum)
I will check A1c and let you know if metformin is needed.   Ok to use cream until rash goes away - another week or two is ok.   Can try warm compresses to sore area, but I would recommend meeting with your vascular specialist next week - please call for appointment.  If any persistent or worsening pain, or new leg swelling, be seen sooner, including in the emergency room if needed.   Thank you for coming in today.    Diabetes Mellitus and Nutrition When you have diabetes (diabetes mellitus), it is very important to have healthy eating habits because your blood sugar (glucose) levels are greatly affected by what you eat and drink. Eating healthy foods in the appropriate amounts, at about the same times every day, can help you:  Control your blood glucose.  Lower your risk of heart disease.  Improve your blood pressure.  Reach or maintain a healthy weight.  Every person with diabetes is different, and each person has different needs for a meal plan. Your health care provider may recommend that you work with a diet and nutrition specialist (dietitian) to make a meal plan that is best for you. Your meal plan may vary depending on factors such as:  The calories you need.  The medicines you take.  Your weight.  Your blood glucose, blood pressure, and cholesterol levels.  Your activity level.  Other health conditions you have, such as heart or kidney disease.  How do carbohydrates affect me? Carbohydrates affect your blood glucose level more than any other type of food. Eating carbohydrates naturally increases the amount of glucose in your blood. Carbohydrate counting is a method for keeping track of how many carbohydrates you eat. Counting carbohydrates is important to keep your blood glucose at a healthy level, especially if you use insulin or take certain oral diabetes medicines. It is important to know how many carbohydrates you can safely have in each meal. This is different for  every person. Your dietitian can help you calculate how many carbohydrates you should have at each meal and for snack. Foods that contain carbohydrates include:  Bread, cereal, rice, pasta, and crackers.  Potatoes and corn.  Peas, beans, and lentils.  Milk and yogurt.  Fruit and juice.  Desserts, such as cakes, cookies, ice cream, and candy.  How does alcohol affect me? Alcohol can cause a sudden decrease in blood glucose (hypoglycemia), especially if you use insulin or take certain oral diabetes medicines. Hypoglycemia can be a life-threatening condition. Symptoms of hypoglycemia (sleepiness, dizziness, and confusion) are similar to symptoms of having too much alcohol. If your health care provider says that alcohol is safe for you, follow these guidelines:  Limit alcohol intake to no more than 1 drink per day for nonpregnant women and 2 drinks per day for men. One drink equals 12 oz of beer, 5 oz of wine, or 1 oz of hard liquor.  Do not drink on an empty stomach.  Keep yourself hydrated with water, diet soda, or unsweetened iced tea.  Keep in mind that regular soda, juice, and other mixers may contain a lot of sugar and must be counted as carbohydrates.  What are tips for following this plan? Reading food labels  Start by checking the serving size on the label. The amount of calories, carbohydrates, fats, and other nutrients listed on the label are based on one serving of the food. Many foods contain more than one serving per package.  Check the total  grams (g) of carbohydrates in one serving. You can calculate the number of servings of carbohydrates in one serving by dividing the total carbohydrates by 15. For example, if a food has 30 g of total carbohydrates, it would be equal to 2 servings of carbohydrates.  Check the number of grams (g) of saturated and trans fats in one serving. Choose foods that have low or no amount of these fats.  Check the number of milligrams (mg) of  sodium in one serving. Most people should limit total sodium intake to less than 2,300 mg per day.  Always check the nutrition information of foods labeled as "low-fat" or "nonfat". These foods may be higher in added sugar or refined carbohydrates and should be avoided.  Talk to your dietitian to identify your daily goals for nutrients listed on the label. Shopping  Avoid buying canned, premade, or processed foods. These foods tend to be high in fat, sodium, and added sugar.  Shop around the outside edge of the grocery store. This includes fresh fruits and vegetables, bulk grains, fresh meats, and fresh dairy. Cooking  Use low-heat cooking methods, such as baking, instead of high-heat cooking methods like deep frying.  Cook using healthy oils, such as olive, canola, or sunflower oil.  Avoid cooking with butter, cream, or high-fat meats. Meal planning  Eat meals and snacks regularly, preferably at the same times every day. Avoid going long periods of time without eating.  Eat foods high in fiber, such as fresh fruits, vegetables, beans, and whole grains. Talk to your dietitian about how many servings of carbohydrates you can eat at each meal.  Eat 4-6 ounces of lean protein each day, such as lean meat, chicken, fish, eggs, or tofu. 1 ounce is equal to 1 ounce of meat, chicken, or fish, 1 egg, or 1/4 cup of tofu.  Eat some foods each day that contain healthy fats, such as avocado, nuts, seeds, and fish. Lifestyle   Check your blood glucose regularly.  Exercise at least 30 minutes 5 or more days each week, or as told by your health care provider.  Take medicines as told by your health care provider.  Do not use any products that contain nicotine or tobacco, such as cigarettes and e-cigarettes. If you need help quitting, ask your health care provider.  Work with a Veterinary surgeon or diabetes educator to identify strategies to manage stress and any emotional and social challenges. What are  some questions to ask my health care provider?  Do I need to meet with a diabetes educator?  Do I need to meet with a dietitian?  What number can I call if I have questions?  When are the best times to check my blood glucose? Where to find more information:  American Diabetes Association: diabetes.org/food-and-fitness/food  Academy of Nutrition and Dietetics: https://www.vargas.com/  [employer] of Diabetes and Digestive and Kidney Diseases (NIH): FindJewelers.cz Summary  A healthy meal plan will help you control your blood glucose and maintain a healthy lifestyle.  Working with a diet and nutrition specialist (dietitian) can help you make a meal plan that is best for you.  Keep in mind that carbohydrates and alcohol have immediate effects on your blood glucose levels. It is important to count carbohydrates and to use alcohol carefully. This information is not intended to replace advice given to you by your health care provider. Make sure you discuss any questions you have with your health care provider. Document Released: 05/07/2005 Document Revised: 09/14/2016 Document Reviewed:  09/14/2016 Elsevier Interactive Patient Education  [employer].  If you have lab work done today you will be contacted with your lab results within the next 2 weeks.  If you have not heard from [place] then please contact us. The fastest way to get your results is to register for My Chart.   IF you received an x-ray today, you will receive an invoice from [facility]. Please contact [facility] at [phone] with questions or concerns regarding your invoice.   IF you received labwork today, you will receive an invoice from [place]. Please contact [facility] at [phone] with questions or concerns regarding your invoice.   Our billing staff will not be able to assist  you with questions regarding bills from these companies.  You will be contacted with the lab results as soon as they are available. The fastest way to get your results is to activate your My Chart account. Instructions are located on the last page of this paperwork. If you have not heard from [place] regarding the results in 2 weeks, please contact this office.

## 2018-06-24 NOTE — Progress Notes (Signed)
Subjective:  By signing my name below, I, Stann Ore, attest that this documentation has been prepared under the direction and in the presence of Meredith Staggers, MD. Electronically Signed: Stann Ore, Scribe. 06/24/2018 , 10:57 AM .  Patient was seen in Room 9 .   Patient ID: Danielle Cooke, female    DOB: [birth year], 42 y.o.   MRN: [id number] Chief Complaint  Patient presents with  . Diabetes check   HPI Danielle Cooke is a 42 y.o. female Here for follow up of diabetes. She was seen on Oct 4th for annual physical exam.   Right Leg pain Patient has a history of DVT, previously treated with Xarelto. She had superficial DVT in saphenous that was close to the femoral. She had a venous study on Aug 14th with [facility], no evidence of thrombosis but had venous reflux. Xarelto was stopped, plan for compression stockings with long plane rides and travel. She was continued on aspirin qd. 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   She mentions having some leg pain and aching that started yesterday evening in her right leg. She was concerned as she had previous blood clot in her right leg. She notes this leg pain and ache occurs every now and then. She denies any new swelling.   Diabetes Planned on diet and exercise with repeat testing in 1 month and option of metformin.   Lab Results  Component Value Date   HGBA1C 6.8 (H) 03/17/2018   Wt Readings from Last 3 Encounters:  06/24/18 277 lb 3.2 oz (125.7 kg)  05/27/18 276 lb 12.8 oz (125.6 kg)  04/14/18 282 lb (127.9 kg)   Patient states she's been doing well. She's been trying not to eat out as much. She plans on doing meal planning. Aside from walking her dog and walking around work, she hasn't been doing regular exercise. She eats breakfast daily.   Rash on thigh Discussed 1 month ago; thought to be tinea corporis. She was prescribed lotrisone.   Patient notes the rash is mostly resolved,  as it only has a faded shadow left in the area. She denies any further itching. She hasn't been using lotrisone cream over the area. She denies side effects from the lotrisone.   Patient Active Problem List   Diagnosis Date Noted  . Asthma without status asthmaticus 05/17/2018  . Obesity 05/17/2018  . Seasonal allergic rhinitis 05/17/2018  . 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 Chronic venous insufficiency 04/06/2018  . Superficial thrombosis of lower extremity, right 11/02/2017  . Bilateral lower extremity edema 11/02/2017  . Low serum HDL 11/27/2014  . Prediabetes 11/27/2014  . Vitamin D deficiency 11/27/2014   Past Medical History:  Diagnosis Date  . Allergy   . DVT (deep venous thrombosis) (HCC)   . Peripheral vascular disease [facility])    Past Surgical History:  Procedure Laterality Date  . ANAL FISTULECTOMY    . FINGER SURGERY     No Known Allergies Prior to Admission medications   Medication Sig Start Date End Date Taking? Authorizing Provider  albuterol (PROVENTIL HFA;VENTOLIN HFA) 108 (90 Base) MCG/ACT inhaler Inhale 1-2 puffs into the lungs every 4 (four) hours as needed for wheezing or shortness of breath. 04/14/18   Shade Flood, MD  Biotin 10 MG CAPS Take by mouth.    [provider]  clotrimazole-betamethasone (LOTRISONE) cream Apply 1 application topically 2 (two) times daily. 05/27/18   Shade Flood,  MD  triamcinolone cream (KENALOG) 0.1 % Apply 1 application topically 2 (two) times daily. 05/20/18   Betancourt, Jarold Song, NP   Social History   Socioeconomic History  . Marital status: Single    Spouse name: Not on file  . Number of children: Not on file  . Years of education: Not on file  . Highest education level: Not on file  Occupational History  . Not on file  Social Needs  . Financial resource strain: Not on file  . Food insecurity:    Worry: Not on file    Inability: Not on file  . Transportation needs:    Medical: Not  on file    Non-medical: Not on file  Tobacco Use  . Smoking status: Never Smoker  . Smokeless tobacco: Never Used  Substance and Sexual Activity  . Alcohol use: Yes    Comment: 1-2 times a month  . Drug use: No  . Sexual activity: Not on file  Lifestyle  . Physical activity:    Days per week: Not on file    Minutes per session: Not on file  . Stress: Not on file  Relationships  . Social connections:    Talks on phone: Not on file    Gets together: Not on file    Attends religious service: Not on file    Active member of club or organization: Not on file    Attends meetings of clubs or organizations: Not on file    Relationship status: Not on file  . Intimate partner violence:    Fear of current or ex partner: Not on file    Emotionally abused: Not on file    Physically abused: Not on file    Forced sexual activity: Not on file  Other Topics Concern  . Not on file  Social History Narrative  . Not on file   Review of Systems  Constitutional: Negative for chills, fatigue, fever and unexpected weight change.  Respiratory: Negative for cough.   Gastrointestinal: Negative for abdominal pain, constipation, diarrhea, nausea and vomiting.  Musculoskeletal: Positive for myalgias.  Skin: Negative for rash and wound.  Neurological: Negative for dizziness, weakness and headaches.       Objective:   Physical Exam  Constitutional: She is oriented to person, place, and time. She appears well-developed and well-nourished. No distress.  HENT:  Head: Normocephalic and atraumatic.  Eyes: Pupils are equal, round, and reactive to light. EOM are normal.  Neck: Neck supple.  Cardiovascular: Normal rate.  Pulmonary/Chest: Effort normal. No respiratory distress.  Musculoskeletal: Normal range of motion.  Bilateral pedal edema, calves non tender, slight tenderness medial right thigh  Neurological: She is alert and oriented to person, place, and time.  Skin: Skin is warm and dry.    Psychiatric: She has a normal mood and affect. Her behavior is normal.  Nursing note and vitals reviewed.   Vitals:   06/24/18 1046  BP: 119/84  Pulse: 61  Temp: 97.9 F (36.6 C)  TempSrc: Oral  SpO2: 98%  Weight: 277 lb 3.2 oz (125.7 kg)  Height: 5\' 1"  (1.549 m)       Assessment & Plan:    Danielle Cooke is a 42 y.o. female Type 2 diabetes mellitus without complication, without long-term current use of insulin (HCC) - Plan: Hemoglobin A1c  -Check A1c to determine if need for metformin.  Continue to watch diet, activity/exercise for weight control.   Right leg pain Thrombophlebitis of superficial veins of right  lower extremity  -History of superficial phlebitis but required anticoagulation previously.  Recommended warm compresses to area, follow-up with vascular specialist next week.  Differential includes repeat superficial Vitas versus post robotic syndrome.  Calf without edema or pain, less likely DVT.  Tinea corporis  -Improving, continue topical Lotrisone until rash has resolved. RTC precautions  No orders of the defined types were placed in this encounter.  Patient Instructions   I will check A1c and let you know if metformin is needed.   Ok to use cream until rash goes away - another week or two is ok.   Can try warm compresses to sore area, but I would recommend meeting with your vascular specialist next week - please call for appointment.  If any persistent or worsening pain, or new leg swelling, be seen sooner, including in the emergency room if needed.   Thank you for coming in today.    Diabetes Mellitus and Nutrition When you have diabetes (diabetes mellitus), it is very important to have healthy eating habits because your blood sugar (glucose) levels are greatly affected by what you eat and drink. Eating healthy foods in the appropriate amounts, at about the same times every day, can help you:  Control your blood glucose.  Lower your risk of heart  disease.  Improve your blood pressure.  Reach or maintain a healthy weight.  Every person with diabetes is different, and each person has different needs for a meal plan. Your health care provider may recommend that you work with a diet and nutrition specialist (dietitian) to make a meal plan that is best for you. Your meal plan may vary depending on factors such as:  The calories you need.  The medicines you take.  Your weight.  Your blood glucose, blood pressure, and cholesterol levels.  Your activity level.  Other health conditions you have, such as heart or kidney disease.  How do carbohydrates affect me? Carbohydrates affect your blood glucose level more than any other type of food. Eating carbohydrates naturally increases the amount of glucose in your blood. Carbohydrate counting is a method for keeping track of how many carbohydrates you eat. Counting carbohydrates is important to keep your blood glucose at a healthy level, especially if you use insulin or take certain oral diabetes medicines. It is important to know how many carbohydrates you can safely have in each meal. This is different for every person. Your dietitian can help you calculate how many carbohydrates you should have at each meal and for snack. Foods that contain carbohydrates include:  Bread, cereal, rice, pasta, and crackers.  Potatoes and corn.  Peas, beans, and lentils.  Milk and yogurt.  Fruit and juice.  Desserts, such as cakes, cookies, ice cream, and candy.  How does alcohol affect me? Alcohol can cause a sudden decrease in blood glucose (hypoglycemia), especially if you use insulin or take certain oral diabetes medicines. Hypoglycemia can be a life-threatening condition. Symptoms of hypoglycemia (sleepiness, dizziness, and confusion) are similar to symptoms of having too much alcohol. If your health care provider says that alcohol is safe for you, follow these guidelines:  Limit alcohol intake  to no more than 1 drink per day for nonpregnant women and 2 drinks per day for men. One drink equals 12 oz of beer, 5 oz of wine, or 1 oz of hard liquor.  Do not drink on an empty stomach.  Keep yourself hydrated with water, diet soda, or unsweetened iced tea.  Keep in mind  that regular soda, juice, and other mixers may contain a lot of sugar and must be counted as carbohydrates.  What are tips for following this plan? Reading food labels  Start by checking the serving size on the label. The amount of calories, carbohydrates, fats, and other nutrients listed on the label are based on one serving of the food. Many foods contain more than one serving per package.  Check the total grams (g) of carbohydrates in one serving. You can calculate the number of servings of carbohydrates in one serving by dividing the total carbohydrates by 15. For example, if a food has 30 g of total carbohydrates, it would be equal to 2 servings of carbohydrates.  Check the number of grams (g) of saturated and trans fats in one serving. Choose foods that have low or no amount of these fats.  Check the number of milligrams (mg) of sodium in one serving. Most people should limit total sodium intake to less than 2,300 mg per day.  Always check the nutrition information of foods labeled as "low-fat" or "nonfat". These foods may be higher in added sugar or refined carbohydrates and should be avoided.  Talk to your dietitian to identify your daily goals for nutrients listed on the label. Shopping  Avoid buying canned, premade, or processed foods. These foods tend to be high in fat, sodium, and added sugar.  Shop around the outside edge of the grocery store. This includes fresh fruits and vegetables, bulk grains, fresh meats, and fresh dairy. Cooking  Use low-heat cooking methods, such as baking, instead of high-heat cooking methods like deep frying.  Cook using healthy oils, such as olive, canola, or sunflower  oil.  Avoid cooking with butter, cream, or high-fat meats. Meal planning  Eat meals and snacks regularly, preferably at the same times every day. Avoid going long periods of time without eating.  Eat foods high in fiber, such as fresh fruits, vegetables, beans, and whole grains. Talk to your dietitian about how many servings of carbohydrates you can eat at each meal.  Eat 4-6 ounces of lean protein each day, such as lean meat, chicken, fish, eggs, or tofu. 1 ounce is equal to 1 ounce of meat, chicken, or fish, 1 egg, or 1/4 cup of tofu.  Eat some foods each day that contain healthy fats, such as avocado, nuts, seeds, and fish. Lifestyle   Check your blood glucose regularly.  Exercise at least 30 minutes 5 or more days each week, or as told by your health care provider.  Take medicines as told by your health care provider.  Do not use any products that contain nicotine or tobacco, such as cigarettes and e-cigarettes. If you need help quitting, ask your health care provider.  Work with a Veterinary surgeon or diabetes educator to identify strategies to manage stress and any emotional and social challenges. What are some questions to ask my health care provider?  Do I need to meet with a diabetes educator?  Do I need to meet with a dietitian?  What number can I call if I have questions?  When are the best times to check my blood glucose? Where to find more information:  American Diabetes Association: diabetes.org/food-and-fitness/food  Academy of Nutrition and Dietetics: https://www.vargas.com/  [employer] of Diabetes and Digestive and Kidney Diseases (NIH): FindJewelers.cz Summary  A healthy meal plan will help you control your blood glucose and maintain a healthy lifestyle.  Working with a diet and nutrition specialist (dietitian) can help you  make a meal plan that  is best for you.  Keep in mind that carbohydrates and alcohol have immediate effects on your blood glucose levels. It is important to count carbohydrates and to use alcohol carefully. This information is not intended to replace advice given to you by your health care provider. Make sure you discuss any questions you have with your health care provider. Document Released: 05/07/2005 Document Revised: 09/14/2016 Document Reviewed: 09/14/2016 Elsevier Interactive Patient Education  [employer].  If you have lab work done today you will be contacted with your lab results within the next 2 weeks.  If you have not heard from [place] then please contact us. The fastest way to get your results is to register for My Chart.   IF you received an x-ray today, you will receive an invoice from [facility]. Please contact [facility] at [phone] with questions or concerns regarding your invoice.   IF you received labwork today, you will receive an invoice from [place]. Please contact [facility] at [phone] with questions or concerns regarding your invoice.   Our billing staff will not be able to assist you with questions regarding bills from these companies.  You will be contacted with the lab results as soon as they are available. The fastest way to get your results is to activate your My Chart account. Instructions are located on the last page of this paperwork. If you have not heard from [place] regarding the results in 2 weeks, please contact this office.       I personally performed the services described in this documentation, which was scribed in my presence. The recorded information has been reviewed and considered for accuracy and completeness, addended by me as needed, and agree with information above.  Signed,   Meredith Staggers, MD [facility] at [facility].  06/26/18 9:41 AM

## 2018-06-25 LAB — HEMOGLOBIN A1C
ESTIMATED AVERAGE GLUCOSE: 146 mg/dL
HEMOGLOBIN A1C: 6.7 % — AB (ref 4.8–5.6)

## 2018-07-01 ENCOUNTER — Ambulatory Visit: Payer: Self-pay

## 2018-07-01 DIAGNOSIS — Z23 Encounter for immunization: Secondary | ICD-10-CM

## 2018-07-04 ENCOUNTER — Encounter: Payer: Self-pay | Admitting: Family Medicine

## 2018-09-26 ENCOUNTER — Telehealth: Payer: Self-pay | Admitting: *Deleted

## 2018-09-26 ENCOUNTER — Ambulatory Visit: Payer: Self-pay | Admitting: *Deleted

## 2018-09-26 VITALS — BP 117/93 | Ht 61.0 in | Wt 272.0 lb

## 2018-09-26 DIAGNOSIS — Z0000 Encounter for general adult medical examination without abnormal findings: Secondary | ICD-10-CM

## 2018-09-26 MED ORDER — TRIAMCINOLONE ACETONIDE 0.1 % EX CREA: 1.0000 "application " | TOPICAL_CREAM | Freq: Two times a day (BID) | CUTANEOUS | 0 refills | Status: DC

## 2018-09-26 NOTE — Telephone Encounter (Signed)
Pt reports same rash that she was treated for in Sept 2019 in clinic has returned in the past week in the same two areas, one larger main lesion and one smaller satellite lesion that developed a few days later. Would like to restart the Triamcinolone cream she was given previously that helped to clear the rash over about 6 weeks, but cannot locate the cream at home. Requesting refill to restart. Rx refilled and added to clinic clipboard.

## 2018-09-26 NOTE — Progress Notes (Signed)
[employer] insurance premium discount evaluation: Labs Drawn. Replacements ROI form signed. Tobacco Free Attestation form signed.  Forms placed in paper chart. Ok to route results to pcp. Plan to recheck BP at result review tomorrow.

## 2018-09-27 ENCOUNTER — Encounter: Payer: Self-pay | Admitting: *Deleted

## 2018-09-27 LAB — CMP12+LP+TP+TSH+6AC+CBC/D/PLT
A/G RATIO: 1.6 (ref 1.2–2.2)
ALBUMIN: 4.3 g/dL (ref 3.8–4.8)
ALK PHOS: 53 IU/L (ref 39–117)
ALT: 17 IU/L (ref 0–32)
AST: 15 IU/L (ref 0–40)
BASOS: 1 %
BUN / CREAT RATIO: 14 (ref 9–23)
BUN: 10 mg/dL (ref 6–24)
Basophils Absolute: 0.1 10*3/uL (ref 0.0–0.2)
Bilirubin Total: 0.3 mg/dL (ref 0.0–1.2)
CALCIUM: 8.9 mg/dL (ref 8.7–10.2)
CHOL/HDL RATIO: 3.6 ratio (ref 0.0–4.4)
CHOLESTEROL TOTAL: 157 mg/dL (ref 100–199)
CREATININE: 0.73 mg/dL (ref 0.57–1.00)
Chloride: 101 mmol/L (ref 96–106)
EOS (ABSOLUTE): 0.2 10*3/uL (ref 0.0–0.4)
EOS: 3 %
ESTIMATED CHD RISK: 0.6 times avg. (ref 0.0–1.0)
Free Thyroxine Index: 1.6 (ref 1.2–4.9)
GFR, EST AFRICAN AMERICAN: 115 mL/min/{1.73_m2} (ref >59)
GFR, EST NON AFRICAN AMERICAN: 100 mL/min/{1.73_m2} (ref >59)
GGT: 14 IU/L (ref 0–60)
GLOBULIN, TOTAL: 2.7 g/dL (ref 1.5–4.5)
Glucose: 114 mg/dL — ABNORMAL HIGH (ref 65–99)
HDL: 44 mg/dL (ref >39)
HEMOGLOBIN: 12.9 g/dL (ref 11.1–15.9)
Hematocrit: 40 % (ref 34.0–46.6)
Immature Grans (Abs): 0 10*3/uL (ref 0.0–0.1)
Immature Granulocytes: 0 %
Iron: 45 ug/dL (ref 27–159)
LDH: 178 IU/L (ref 119–226)
LDL Calculated: 98 mg/dL (ref 0–99)
LYMPHS ABS: 2 10*3/uL (ref 0.7–3.1)
Lymphs: 29 %
MCH: 27.6 pg (ref 26.6–33.0)
MCHC: 32.3 g/dL (ref 31.5–35.7)
MCV: 86 fL (ref 79–97)
MONOCYTES: 9 %
Monocytes Absolute: 0.7 10*3/uL (ref 0.1–0.9)
NEUTROS ABS: 4.1 10*3/uL (ref 1.4–7.0)
Neutrophils: 58 %
POTASSIUM: 4.2 mmol/L (ref 3.5–5.2)
Phosphorus: 3 mg/dL (ref 3.0–4.3)
Platelets: 206 10*3/uL (ref 150–450)
RBC: 4.68 x10E6/uL (ref 3.77–5.28)
RDW: 14.3 % (ref 11.7–15.4)
SODIUM: 140 mmol/L (ref 134–144)
T3 Uptake Ratio: 23 % — ABNORMAL LOW (ref 24–39)
T4, Total: 7.1 ug/dL (ref 4.5–12.0)
TSH: 2.82 u[IU]/mL (ref 0.450–4.500)
Total Protein: 7 g/dL (ref 6.0–8.5)
Triglycerides: 75 mg/dL (ref 0–149)
Uric Acid: 5.4 mg/dL (ref 2.5–7.1)
VLDL CHOLESTEROL CAL: 15 mg/dL (ref 5–40)
WBC: 7 10*3/uL (ref 3.4–10.8)

## 2018-09-27 LAB — HGB A1C W/O EAG: HEMOGLOBIN A1C: 6.6 % — AB (ref 4.8–5.6)

## 2018-09-27 NOTE — Progress Notes (Signed)
noted 

## 2018-09-27 NOTE — Telephone Encounter (Signed)
Agree with plan of care Rx signed if no improvement as anticipated follow up for re-evaluation especially if worsening swelling,spreading, erythema, purulent discharge and/or worsening pain

## 2018-10-06 ENCOUNTER — Telehealth: Payer: Self-pay | Admitting: Registered Nurse

## 2018-10-06 ENCOUNTER — Encounter: Payer: Self-pay | Admitting: Registered Nurse

## 2018-10-06 NOTE — Telephone Encounter (Signed)
Still applying triamcinolone rash on anterior thigh has enlarged slightly; not applying emollient.  Slightly itchy.  Discussed with patient avoid itching, avoid hot showers and add emollient like vaseline am/pm without fragrance also.  If not improving to let clinic staff know and will schedule re-evaluation next week.  Patient verbalized understanding information/instructions, agreed with plan of care and had no further questions at this time.

## 2019-03-03 ENCOUNTER — Emergency Department (HOSPITAL_BASED_OUTPATIENT_CLINIC_OR_DEPARTMENT_OTHER): Payer: No Typology Code available for payment source

## 2019-03-03 ENCOUNTER — Encounter (HOSPITAL_COMMUNITY): Payer: Self-pay | Admitting: Emergency Medicine

## 2019-03-03 ENCOUNTER — Telehealth (INDEPENDENT_AMBULATORY_CARE_PROVIDER_SITE_OTHER): Payer: Self-pay

## 2019-03-03 ENCOUNTER — Other Ambulatory Visit: Payer: Self-pay

## 2019-03-03 ENCOUNTER — Emergency Department (HOSPITAL_COMMUNITY)
Admission: EM | Admit: 2019-03-03 | Discharge: 2019-03-03 | Disposition: A | Discharge status: Home / Self Care | Payer: No Typology Code available for payment source | Attending: Emergency Medicine | Admitting: Emergency Medicine

## 2019-03-03 DIAGNOSIS — I82811 Embolism and thrombosis of superficial veins of right lower extremities: Secondary | ICD-10-CM | POA: Insufficient documentation

## 2019-03-03 DIAGNOSIS — Z79899 Other long term (current) drug therapy: Secondary | ICD-10-CM | POA: Insufficient documentation

## 2019-03-03 DIAGNOSIS — Z86718 Personal history of other venous thrombosis and embolism: Secondary | ICD-10-CM | POA: Diagnosis not present

## 2019-03-03 DIAGNOSIS — I82890 Acute embolism and thrombosis of other specified veins: Secondary | ICD-10-CM

## 2019-03-03 DIAGNOSIS — L538 Other specified erythematous conditions: Secondary | ICD-10-CM | POA: Diagnosis not present

## 2019-03-03 DIAGNOSIS — J45909 Unspecified asthma, uncomplicated: Secondary | ICD-10-CM | POA: Insufficient documentation

## 2019-03-03 DIAGNOSIS — M79651 Pain in right thigh: Secondary | ICD-10-CM | POA: Diagnosis present

## 2019-03-03 MED ORDER — RIVAROXABAN (XARELTO) EDUCATION KIT FOR DVT/PE PATIENTS
PACK | Freq: Once | Status: AC
  Administered 2019-03-03: 11:00:00
  Filled 2019-03-03: qty 1

## 2019-03-03 MED ORDER — RIVAROXABAN (XARELTO) VTE STARTER PACK (15 & 20 MG): ORAL_TABLET | ORAL | 0 refills | Status: DC

## 2019-03-03 NOTE — ED Notes (Signed)
Patient given discharge teaching and verbalized understanding. Patient ambulated out of ED with a steady gait. 

## 2019-03-03 NOTE — Progress Notes (Signed)
Right lower extremity venous duplex has been completed. Preliminary results can be found in CV Proc through chart review.  Results were given to [facility] PA.  03/03/19 9:48 AM Danielle Cooke RVT

## 2019-03-03 NOTE — Progress Notes (Signed)
Called about pt superficial thrombosis saphenous.  We will arrange repeat duplex for reflux and office visit in one month  She was given a Rx for anticoagulant in the ER  Ruta Hinds, MD [facility] Office: [phone] Pager: [phone]

## 2019-03-03 NOTE — ED Provider Notes (Signed)
[facility] DEPT Provider Note   CSN: [id number] Arrival date & time: 03/03/19  0840    History   Chief Complaint Chief Complaint  Patient presents with  . possible DVT    HPI Danielle Cooke is a 43 y.o. female.     The history is provided by the patient and medical records. No language interpreter was used.   Danielle Cooke is a 43 y.o. female  with a PMH of previous DVT who presents to the Emergency Department complaining of pain and swelling to the right upper thigh which feels similar to when she had a DVT in the past.  Patient states that she was diagnosed with previous DVT in the right lower extremity in March of last year.  She was on anticoagulation for 6 or 7 months, but then she was taken off of her blood thinners.  She states that she has been doing quite well for several months.  About 2 days ago, she noticed intermittent twinges of pain to the right leg, but did not think much about it.  This morning when she woke up, she noticed a palpable area of swelling to the right inner thigh which is slightly erythematous.  She reports a similar constellation of symptoms with her DVT last year.  No medications taken prior to arrival for symptoms.  No numbness or weakness.  No drainage from the area.  Denies any chest pain or shortness of breath.   Past Medical History:  Diagnosis Date  . Allergy   . DVT (deep venous thrombosis) ([place])   . Peripheral vascular disease [facility])     Patient Active Problem List   Diagnosis Date Noted  . Asthma without status asthmaticus 05/17/2018  . Obesity 05/17/2018  . Seasonal allergic rhinitis 05/17/2018  . 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 Chronic venous insufficiency 04/06/2018  . Superficial thrombosis of lower extremity, right 11/02/2017  . Bilateral lower extremity edema 11/02/2017  . Low serum HDL 11/27/2014  . Prediabetes 11/27/2014  . Vitamin D deficiency 11/27/2014    Past  Surgical History:  Procedure Laterality Date  . ANAL FISTULECTOMY    . FINGER SURGERY       OB History   No obstetric history on file.      Home Medications    Prior to Admission medications   Medication Sig Start Date End Date Taking? Authorizing Provider  albuterol (PROVENTIL HFA;VENTOLIN HFA) 108 (90 Base) MCG/ACT inhaler Inhale 1-2 puffs into the lungs every 4 (four) hours as needed for wheezing or shortness of breath. 04/14/18   Wendie Agreste, MD  Biotin 10 MG CAPS Take by mouth.    [provider]  clotrimazole-betamethasone (LOTRISONE) cream Apply 1 application topically 2 (two) times daily. 05/27/18   Wendie Agreste, MD  Rivaroxaban 15 & 20 MG TBPK Take as directed on package: Start with one '15mg'$  tablet by mouth twice a day with food. On Day 22, switch to one '20mg'$  tablet once a day with food. 03/03/19   Choice Kleinsasser, Ozella Almond, PA-C  triamcinolone cream (KENALOG) 0.1 % Apply 1 application topically 2 (two) times daily. 09/26/18   Betancourt, Aura Fey, NP  vitamin B-12 (CYANOCOBALAMIN) 250 MCG tablet Take 250 mcg by mouth daily.    [provider]    Family History Family History  Problem Relation Age of Onset  . Diabetes Mother   . Cancer Father     Social History Social History   Tobacco Use  .  Smoking status: Never Smoker  . Smokeless tobacco: Never Used  Substance Use Topics  . Alcohol use: Yes    Comment: 1-2 times a month  . Drug use: No     Allergies   Patient has no known allergies.   Review of Systems Review of Systems  Respiratory: Negative for shortness of breath.   Cardiovascular: Positive for leg swelling. Negative for chest pain and palpitations.  Musculoskeletal: Positive for myalgias.  Skin: Positive for color change.  All other systems reviewed and are negative.    Physical Exam Updated Vital Signs BP (!) 166/81 (BP Location: Right Arm)   Pulse 75   Temp 98.8 F (37.1 C) (Oral)   Resp 16   SpO2 100%   Physical  Exam Vitals signs and nursing note reviewed.  Constitutional:      General: She is not in acute distress.    Appearance: She is well-developed.  HENT:     Head: Normocephalic and atraumatic.  Neck:     Musculoskeletal: Neck supple.  Cardiovascular:     Rate and Rhythm: Normal rate and regular rhythm.     Heart sounds: Normal heart sounds. No murmur.  Pulmonary:     Effort: Pulmonary effort is normal. No respiratory distress.     Breath sounds: Normal breath sounds.  Abdominal:     General: There is no distension.     Palpations: Abdomen is soft.     Tenderness: There is no abdominal tenderness.  Musculoskeletal: Normal range of motion.     Comments: Right inner thigh with approximately 2 cm area of swelling. Small generalized area of erythema surrounding this about 5-6 cm in diameter. No calf tenderness. 2+ DP. Sensation intact. Full ROM.   Skin:    General: Skin is warm and dry.  Neurological:     Mental Status: She is alert and oriented to person, place, and time.      ED Treatments / Results  Labs (all labs ordered are listed, but only abnormal results are displayed) Labs Reviewed - No data to display  EKG None  Radiology Vas [place] Lower Extremity Venous (dvt) (only [facility])  Result Date: 03/03/2019  Lower Venous Study Indications: Erythema.  Risk Factors: None identified. Limitations: Body habitus and poor ultrasound/tissue interface. Comparison Study: 11/10/2017 - SVT in GSV at knee Performing Technologist: Oliver Hum RVT  Examination Guidelines: A complete evaluation includes B-mode imaging, spectral Doppler, color Doppler, and power Doppler as needed of all accessible portions of each vessel. Bilateral testing is considered an integral part of a complete examination. Limited examinations for reoccurring indications may be performed as noted.  +---------+---------------+---------+-----------+----------+-------+ RIGHT     CompressibilityPhasicitySpontaneityPropertiesSummary +---------+---------------+---------+-----------+----------+-------+ CFV      Full           Yes      Yes                          +---------+---------------+---------+-----------+----------+-------+ SFJ      Full                                                 +---------+---------------+---------+-----------+----------+-------+ FV Prox  Full                                                 +---------+---------------+---------+-----------+----------+-------+  FV Mid   Full                                                 +---------+---------------+---------+-----------+----------+-------+ FV DistalFull                                                 +---------+---------------+---------+-----------+----------+-------+ PFV      Full                                                 +---------+---------------+---------+-----------+----------+-------+ POP      Full           Yes      Yes                          +---------+---------------+---------+-----------+----------+-------+ PTV      Full                                                 +---------+---------------+---------+-----------+----------+-------+ GSV      None                                         Acute   +---------+---------------+---------+-----------+----------+-------+   +----+---------------+---------+-----------+----------+-------+ LEFTCompressibilityPhasicitySpontaneityPropertiesSummary +----+---------------+---------+-----------+----------+-------+ CFV Full           Yes      Yes                          +----+---------------+---------+-----------+----------+-------+     Summary: Right: Findings consistent with acute superficial vein thrombosis involving the right great saphenous vein. There is no evidence of deep vein thrombosis in the lower extremity. However, portions of this examination were limited- see  technologist comments  above. No cystic structure found in the popliteal fossa. Left: No evidence of common femoral vein obstruction.  *See table(s) above for measurements and observations.    Preliminary     Procedures Procedures (including critical care time)  Medications Ordered in ED Medications  rivaroxaban (XARELTO) Education Kit for DVT/PE patients (has no administration in time range)     Initial Impression / Assessment and Plan / ED Course  I have reviewed the triage vital signs and the nursing notes.  Pertinent labs & imaging results that were available during my care of the patient were reviewed by me and considered in my medical decision making (see chart for details).       Danielle Cooke is a 43 y.o. female who presents to ED for right lower extremity pain and swelling to the inner thigh which began this morning.  Feels similar to her previous SVT of the greater saphenous vein last year.  Per chart review, patient had previous superficial great saphenous vein thrombus in March 2019.  She was actually started on Xarelto due to the proximity of the SVT  to the deep venous system.  Exam today concerning for potential clot.  Neurovascularly intact.  No signs of infectious etiology.  No sxs to suggest PE.  Vascular ultrasound obtained showing findings consistent with acute superficial vein thrombosis of her right great saphenous vein once again.  There is no evidence of deep vein thrombosis.  I discussed case with on-call vascular surgery, Dr. Oneida Alar, who recommends restarting anticoagulation and follow-up in the clinic.  Updated patient on home care instructions, follow-up plan as well as return precautions.  All questions answered.   Final Clinical Impressions(s) / ED Diagnoses   Final diagnoses:  Superficial vein thrombosis    ED Discharge Orders         Ordered    Rivaroxaban 15 & 20 MG TBPK     03/03/19 1027           Marsi Turvey, Ozella Almond, PA-C 03/03/19 1102     Lacretia Leigh, MD 03/05/19 1404

## 2019-03-03 NOTE — Telephone Encounter (Signed)
Patient left a message having some questions to see which offices we are affiliated with and stated that she had went to the ER and was diagnose with DVT but has been released.The patient was last seen in the office in August 2019.I left a message on patient voicemail to return a call back to the office.

## 2019-03-03 NOTE — ED Triage Notes (Addendum)
Per pt, states she has a history of DVT-states she palpated a mass in right inner leg/knee-states redness at site-no SOB-states she is not on blood thinners-"took me off because the blood clots went away"

## 2019-03-03 NOTE — Discharge Instructions (Addendum)
It was my pleasure taking care of you today!   Call the vascular clinic listed today or tomorrow to schedule a follow up appointment.   Return to ER for chest pain, shortness of breath, new or worsening symptoms, any additional concerns.   Information on my medicine - XARELTO (rivaroxaban)  This medication education was reviewed with me or my healthcare representative as part of my discharge preparation.  The pharmacist that spoke with me during my hospital stay was:  WHY WAS XARELTO PRESCRIBED FOR YOU? Xarelto was prescribed to treat blood clots that may have been found in the veins of your legs (deep vein thrombosis) or in your lungs (pulmonary embolism) and to reduce the risk of them occurring again.  WHAT DO YOU NEED TO KNOW ABOUT XARELTO? The starting dose is one 15 mg tablet taken TWICE daily with food for the FIRST 21 DAYS then on (enter date)  03/24/2019   the dose is changed to one 20 mg tablet taken ONCE A DAY with your evening meal.  DO NOT stop taking Xarelto without talking to the health care provider who prescribed the medication.  Refill your prescription for 20 mg tablets before you run out.  After discharge, you should have regular check-up appointments with your healthcare provider that is prescribing your Xarelto.  In the future your dose may need to be changed if your kidney function changes by a significant amount.  WHAT DO YOU DO IF YOU MISS A DOSE? If you are taking Xarelto TWICE DAILY and you miss a dose, take it as soon as you remember. You may take two 15 mg tablets (total 30 mg) at the same time then resume your regularly scheduled 15 mg twice daily the next day.  If you are taking Xarelto ONCE DAILY and you miss a dose, take it as soon as you remember on the same day then continue your regularly scheduled once daily regimen the next day. Do not take two doses of Xarelto at the same time.   IMPORTANT SAFETY INFORMATION Xarelto is a blood thinner medicine  that can cause bleeding. You should call your healthcare provider right away if you experience any of the following: Bleeding from an injury or your nose that does not stop. Unusual colored urine (red or dark brown) or unusual colored stools (red or black). Unusual bruising for unknown reasons. A serious fall or if you hit your head (even if there is no bleeding).  Some medicines may interact with Xarelto and might increase your risk of bleeding while on Xarelto. To help avoid this, consult your healthcare provider or pharmacist prior to using any new prescription or non-prescription medications, including herbals, vitamins, non-steroidal anti-inflammatory drugs (NSAIDs) and supplements.  This website has more information on Xarelto: [email or web address]

## 2019-04-03 ENCOUNTER — Other Ambulatory Visit: Payer: Self-pay

## 2019-04-03 DIAGNOSIS — I82811 Embolism and thrombosis of superficial veins of right lower extremities: Secondary | ICD-10-CM

## 2019-04-05 ENCOUNTER — Encounter: Payer: Self-pay | Admitting: Vascular Surgery

## 2019-04-05 ENCOUNTER — Ambulatory Visit (HOSPITAL_COMMUNITY)
Admission: RE | Admit: 2019-04-05 | Discharge: 2019-04-05 | Disposition: A | Discharge status: Home / Self Care | Payer: No Typology Code available for payment source | Source: Ambulatory Visit | Attending: Vascular Surgery | Admitting: Vascular Surgery

## 2019-04-05 ENCOUNTER — Other Ambulatory Visit: Payer: Self-pay

## 2019-04-05 ENCOUNTER — Ambulatory Visit: Payer: No Typology Code available for payment source | Admitting: Vascular Surgery

## 2019-04-05 VITALS — BP 137/92 | HR 76 | Temp 98.9°F | Resp 14 | Ht 61.0 in | Wt 280.0 lb

## 2019-04-05 DIAGNOSIS — I83811 Varicose veins of right lower extremities with pain: Secondary | ICD-10-CM | POA: Diagnosis not present

## 2019-04-05 DIAGNOSIS — I82811 Embolism and thrombosis of superficial veins of right lower extremities: Secondary | ICD-10-CM | POA: Insufficient documentation

## 2019-04-05 NOTE — Progress Notes (Signed)
Patient name: Danielle KeRoseanne Sagrero MRN: [id number] DOB: 04/08/1976 Sex: female  HPI: Danielle Cooke is a 43 y.o. female, had an episode of superficial thrombophlebitis involving her right greater saphenous vein approximately 6 weeks ago.  She had also had a prior episode 1 year ago.  She is currently on Xarelto.  She was also on a short course of anticoagulation for the prior superficial thrombophlebitis.  She has no family history of hypercoagulable state.  She has no history of varicose veins in her family history.  She does have some occasional swelling that occur in her lower extremities.  She also has noticed some new more prominent veins near her ankle and foot on the right leg.  He was evaluated at [facility] surgery at the time of her prior superficial thrombophlebitis and offered a laser ablation of her right greater saphenous vein.  However she deferred at that point.  She is now interested in potentially having a laser ablation.  She has occasionally worn some compression stockings knee-high in the past.  She is not currently wearing compression stockings.  She has also struggled with her weight over several years.  We discussed several items regarding weight loss today.     Past Medical History:  Diagnosis Date  . Allergy   . DVT (deep venous thrombosis) ([facility])   . Peripheral vascular disease [facility])    Past Surgical History:  Procedure Laterality Date  . ANAL FISTULECTOMY    . FINGER SURGERY      Family History  Problem Relation Age of Onset  . Diabetes Mother   . Cancer Father     SOCIAL HISTORY: Social History   Socioeconomic History  . Marital status: Single    Spouse name: Not on file  . Number of children: Not on file  . Years of education: Not on file  . Highest education level: Not on file  Occupational History  . Not on file  Social Needs  . Financial resource strain: Not on file  . Food insecurity    Worry: Not on file    Inability: Not on file  .  Transportation needs    Medical: Not on file    Non-medical: Not on file  Tobacco Use  . Smoking status: Never Smoker  . Smokeless tobacco: Never Used  Substance and Sexual Activity  . Alcohol use: Yes    Comment: 1-2 times a month  . Drug use: No  . Sexual activity: Not on file  Lifestyle  . Physical activity    Days per week: Not on file    Minutes per session: Not on file  . Stress: Not on file  Relationships  . Social Musicianconnections    Talks on phone: Not on file    Gets together: Not on file    Attends religious service: Not on file    Active member of club or organization: Not on file    Attends meetings of clubs or organizations: Not on file    Relationship status: Not on file  . Intimate partner violence    Fear of current or ex partner: Not on file    Emotionally abused: Not on file    Physically abused: Not on file    Forced sexual activity: Not on file  Other Topics Concern  . Not on file  Social History Narrative  . Not on file    No Known Allergies  Current Outpatient Medications  Medication Sig Dispense Refill  . albuterol (PROVENTIL HFA;VENTOLIN  HFA) 108 (90 Base) MCG/ACT inhaler Inhale 1-2 puffs into the lungs every 4 (four) hours as needed for wheezing or shortness of breath. 1 Inhaler 0  . Biotin 10 MG CAPS Take by mouth.    . Rivaroxaban 15 & 20 MG TBPK Take as directed on package: Start with one 15mg  tablet by mouth twice a day with food. On Day 22, switch to one 20mg  tablet once a day with food. 51 each 0  . vitamin B-12 (CYANOCOBALAMIN) 250 MCG tablet Take 250 mcg by mouth daily.    . clotrimazole-betamethasone (LOTRISONE) cream Apply 1 application topically 2 (two) times daily. (Patient not taking: Reported on 04/05/2019) 30 g 0  . triamcinolone cream (KENALOG) 0.1 % Apply 1 application topically 2 (two) times daily. (Patient not taking: Reported on 04/05/2019) 30 g 0   No current facility-administered medications for this visit.     ROS:    General:  No weight loss, Fever, chills  HEENT: No recent headaches, no nasal bleeding, no visual changes, no sore throat  Neurologic: No dizziness, blackouts, seizures. No recent symptoms of stroke or mini- stroke. No recent episodes of slurred speech, or temporary blindness.  Cardiac: No recent episodes of chest pain/pressure, no shortness of breath at rest.  + shortness of breath with exertion.  Denies history of atrial fibrillation or irregular heartbeat  Vascular: No history of rest pain in feet.  No history of claudication.  No history of non-healing ulcer, No history of DVT   Pulmonary: No home oxygen, no productive cough, no hemoptysis,  No asthma or wheezing  Musculoskeletal:  [ ]  Arthritis, [ ]  Low back pain,  [ ]  Joint pain  Hematologic:No history of hypercoagulable state.  No history of easy bleeding.  No history of anemia  Gastrointestinal: No hematochezia or melena,  No gastroesophageal reflux, no trouble swallowing  Urinary: [ ]  chronic Kidney disease, [ ]  on HD - [ ]  MWF or [ ]  TTHS, [ ]  Burning with urination, [ ]  Frequent urination, [ ]  Difficulty urinating;   Skin: No rashes  Psychological: No history of anxiety,  No history of depression   Physical Examination  Vitals:   04/05/19 1355  BP: (!) 137/92  Pulse: 76  Resp: 14  Temp: 98.9 F (37.2 C)  SpO2: 98%  Weight: 280 lb (127 kg)  Height: 5\' 1"  (1.549 m)    Body mass index is 52.91 kg/m.  General:  Alert and oriented, no acute distress HEENT: Normal Cardiac: Regular Rate and Rhythm  Abdomen: Obese Skin: No rash Extremity Pulses:  2+ radial, brachial, femoral, dorsalis pedis pulses bilaterally Musculoskeletal: No deformity or edema  Neurologic: Upper and lower extremity motor 5/5 and symmetric  DATA: Patient had a venous duplex for reflux today.  There was no reflux in the deep system.  She had diffuse reflux in the right greater saphenous vein.  The thrombus has resolved.  This is compared to a  duplex from approximately 6 weeks ago which showed diffuse thrombus in the right greater saphenous.   ASSESSMENT: Patient with 2 prior episodes of superficial thrombophlebitis in her right greater saphenous vein requiring short terms of anticoagulation.  Obesity   PLAN: #1 patient was given a prescription today for thigh-high 20 to 30 mm prescription compression stockings.  She will try to wear these as much possible to see if they resolve her symptoms.  Otherwise we will consider laser ablation of her right greater saphenous vein in 3 months.  Risk benefits  possible complications procedure details as well as vena path the patient physiology was discussed with the patient today.  Risks including but not limited limited to bleeding infection deep vein thrombosis were discussed the patient today.  She understands agrees to proceed.  2.  Obesity patient was given a prescription today for a walking program to walk 30 minutes daily.  She will try to start this as an exercise program with a long-term goal of significant weight loss.  She understands that her weight contributes to vein problems.  Ruta Hinds, MD [facility] [place] Office: [phone] Pager: [phone]

## 2019-05-25 ENCOUNTER — Encounter: Payer: Self-pay | Admitting: Vascular Surgery

## 2019-06-08 LAB — HM MAMMOGRAPHY

## 2019-06-28 ENCOUNTER — Encounter: Payer: Self-pay | Admitting: Family Medicine

## 2019-07-12 ENCOUNTER — Other Ambulatory Visit: Payer: Self-pay

## 2019-07-12 ENCOUNTER — Encounter: Payer: Self-pay | Admitting: Vascular Surgery

## 2019-07-12 ENCOUNTER — Ambulatory Visit (INDEPENDENT_AMBULATORY_CARE_PROVIDER_SITE_OTHER): Payer: No Typology Code available for payment source | Admitting: Vascular Surgery

## 2019-07-12 VITALS — BP 139/86 | HR 84 | Temp 98.7°F | Resp 16 | Ht 61.0 in | Wt 285.0 lb

## 2019-07-12 DIAGNOSIS — I83811 Varicose veins of right lower extremities with pain: Secondary | ICD-10-CM

## 2019-07-12 NOTE — Progress Notes (Signed)
Patient is a 43 year old female who previously had a superficial thrombophlebitis in her right greater saphenous vein around June 2020.  She was on a short course of anticoagulation for that.  She has no family history of hypercoagulable state.  She returns today after 3 months of compression therapy.  She states her legs are a little bit improved with the compression stockings.  However she has problems getting the stockings to fit right and a bunched up around her knee.  Also still struggling with weight loss.  She is interested in this point at laser ablation to prevent future recurrent episodes of thrombophlebitis.  Review of systems: She has no shortness of breath.  She has no chest pain.  She has no fever.  Physical exam:  Vitals:   07/12/19 1331  BP: 139/86  Pulse: 84  Resp: 16  Temp: 98.7 F (37.1 C)  TempSrc: Temporal  SpO2: 98%  Weight: 285 lb (129.3 kg)  Height: 5\' 1"  (1.549 m)    Right lower extremity: Scattered spider type varicosities right leg  Data: I reviewed reviewed her right greater saphenous vein with the SonoSite at the bedside today.  This shows a 5 mm greater saphenous vein dilated up as much as 7 mm as you approach the saphenofemoral junction.  No significant tortuosity.  Vein is fairly superficial.  Assessment: Patient with symptomatic varicose veins right leg with episodes of thrombophlebitis.  Plan: We will try to get preapproval for laser ablation of her right greater saphenous vein in the near future.  Patient wishes to schedule this sometime during 2021.  Risk benefits possible complications and procedure details were discussed with patient today include but not limited to bleeding infection DVT postoperative pain.  She understands and agrees to proceed.  Ruta Hinds, MD [facility] [place] Office: [phone]

## 2019-09-20 ENCOUNTER — Other Ambulatory Visit: Payer: Self-pay | Admitting: *Deleted

## 2019-09-20 DIAGNOSIS — I83811 Varicose veins of right lower extremities with pain: Secondary | ICD-10-CM

## 2019-10-13 ENCOUNTER — Other Ambulatory Visit: Payer: No Typology Code available for payment source | Admitting: Vascular Surgery

## 2019-10-25 ENCOUNTER — Ambulatory Visit: Payer: No Typology Code available for payment source | Admitting: Vascular Surgery

## 2019-10-25 ENCOUNTER — Encounter (HOSPITAL_COMMUNITY): Payer: No Typology Code available for payment source

## 2019-10-25 ENCOUNTER — Encounter: Payer: Self-pay | Admitting: Vascular Surgery

## 2019-10-25 ENCOUNTER — Other Ambulatory Visit: Payer: Self-pay

## 2019-10-25 VITALS — BP 128/82 | HR 80 | Temp 97.3°F | Resp 16 | Ht 61.0 in | Wt 285.0 lb

## 2019-10-25 DIAGNOSIS — I83811 Varicose veins of right lower extremities with pain: Secondary | ICD-10-CM | POA: Diagnosis not present

## 2019-10-25 HISTORY — PX: ENDOVENOUS ABLATION SAPHENOUS VEIN W/ LASER: SUR449

## 2019-10-25 NOTE — Progress Notes (Signed)
     Laser Ablation Procedure    Date: 10/25/2019   Danielle Cooke DOB:06-12-1976  Consent signed: Yes    Surgeon:Muneeb Veras  MD  Procedure: Laser Ablation: right Greater Saphenous Vein  BP 128/82 (BP Location: Right Arm, Patient Position: Sitting, Cuff Size: Large)   Pulse 80   Temp (!) 97.3 F (36.3 C) (Temporal)   Resp 16   Ht 5\' 1"  (1.549 m)   Wt 285 lb (129.3 kg)   SpO2 100%   BMI 53.85 kg/m   Tumescent Anesthesia: 700 cc 0.9% NaCl with 50 cc Lidocaine HCL 1%  and 15 cc 8.4% NaHCO3  Local Anesthesia: 7 cc Lidocaine HCL and NaHCO3 (ratio 2:1)  15 watts continuous mode        Total energy: 2233 Joules   Total time: 2:28  Laser Fiber Ref. #      Lot # [id number]      Patient tolerated procedure well  Notes: Patient wore face mask.  All staff members wore facial masks and facial shields/goggles.    Description of Procedure:  After marking the course of the secondary varicosities, the patient was placed on the operating table in the supine position, and the right leg was prepped and draped in sterile fashion.   Local anesthetic was administered and under ultrasound guidance the saphenous vein was accessed with a micro needle and guide wire; then the mirco puncture sheath was placed.  A guide wire was inserted saphenofemoral junction , followed by a 5 french sheath.  The position of the sheath and then the laser fiber below the junction was confirmed using the ultrasound.  Tumescent anesthesia was administered along the course of the saphenous vein using ultrasound guidance. The patient was placed in Trendelenburg position and protective laser glasses were placed on patient and staff, and the laser was fired at 15 watts continuous mode advancing 1-72mm/second for a total of 2233 joules.      Steri strip was applied to the IV insertion site and ABD pads and thigh high compression stockings were applied.  Ace wrap bandages were applied over the right thigh and at the  top of the saphenofemoral junction. Blood loss was less than 15 cc.  Discharge instructions reviewed with patient and hardcopy of discharge instructions given to patient to take home. The patient ambulated out of the operating room having tolerated the procedure well.  3m, MD [facility] Office: [phone]

## 2019-10-27 ENCOUNTER — Ambulatory Visit: Payer: No Typology Code available for payment source | Admitting: Vascular Surgery

## 2019-10-27 ENCOUNTER — Encounter (HOSPITAL_COMMUNITY): Payer: No Typology Code available for payment source

## 2019-11-01 ENCOUNTER — Encounter: Payer: Self-pay | Admitting: Vascular Surgery

## 2019-11-01 ENCOUNTER — Ambulatory Visit (INDEPENDENT_AMBULATORY_CARE_PROVIDER_SITE_OTHER): Payer: Self-pay | Admitting: Vascular Surgery

## 2019-11-01 ENCOUNTER — Ambulatory Visit (HOSPITAL_COMMUNITY)
Admission: RE | Admit: 2019-11-01 | Discharge: 2019-11-01 | Disposition: A | Discharge status: Home / Self Care | Payer: No Typology Code available for payment source | Source: Ambulatory Visit | Attending: Vascular Surgery | Admitting: Vascular Surgery

## 2019-11-01 ENCOUNTER — Ambulatory Visit: Payer: No Typology Code available for payment source | Admitting: Vascular Surgery

## 2019-11-01 ENCOUNTER — Ambulatory Visit (HOSPITAL_COMMUNITY): Payer: No Typology Code available for payment source

## 2019-11-01 ENCOUNTER — Other Ambulatory Visit: Payer: Self-pay

## 2019-11-01 VITALS — BP 113/75 | HR 73 | Temp 97.9°F | Resp 18 | Ht 61.0 in | Wt 285.0 lb

## 2019-11-01 DIAGNOSIS — I83811 Varicose veins of right lower extremities with pain: Secondary | ICD-10-CM | POA: Diagnosis not present

## 2019-11-01 NOTE — Progress Notes (Signed)
Patient is a 44 year old female who returns for postoperative follow-up after recent laser ablation of the right greater saphenous vein.  She has not had any thrombophlebitis episodes in the left leg although this leg does have reflux as well.  He has minimal pain over the right medial thigh.  She does have a little bit of pain over a varicosity in her right mid calf.  It is unrelated to her area of ablation.  Data: She had a duplex ultrasound today which shows good closure of the right greater saphenous vein within 4 mm of the saphenofemoral junction.  There was no evidence of DVT.  Assessment: Doing well status post laser ablation right greater saphenous vein.  Plan: Patient will follow up on as-needed basis.  Fabienne Bruns, MD [facility] Office: [phone]

## 2019-11-13 ENCOUNTER — Telehealth: Payer: Self-pay | Admitting: *Deleted

## 2019-11-13 NOTE — Telephone Encounter (Signed)
Returning Danielle Cooke's telephone voice message regarding increased pain over varicosity over medial right calf.  Danielle Cooke is s/p endovenous laser ablation right greater saphenous vein on 10-25-2019.  On 1 week VV follow up visit she reported pain over varicosity right medial calf and venous duplex was negative for DVT.  Was unable to speak with Danielle Cooke but left detailed voice message with her offering her an appointment to see Dr. Darrick Penna 11-15-2019 at 8:30 AM.

## 2019-11-14 ENCOUNTER — Telehealth: Payer: Self-pay | Admitting: *Deleted

## 2019-11-14 NOTE — Telephone Encounter (Signed)
Was unable to reach Ms. Otwell but left a detailed voice message on her cell phone.  Called home phone number and spoke to her mother Navil Kole) who said she would give Julieta the message that I had left voice messages on her cell phone yesterday and today offering her an appointment with Dr. Darrick Penna tomorrow on 11-15-2019 at 8:30 AM to evaluate pain over varicosity right calf.

## 2019-11-15 ENCOUNTER — Ambulatory Visit: Payer: No Typology Code available for payment source | Admitting: Vascular Surgery

## 2019-11-17 ENCOUNTER — Telehealth: Payer: Self-pay | Admitting: Registered Nurse

## 2019-11-17 NOTE — Telephone Encounter (Signed)
Patient stopped me in warehouse to ask for advice regarding right lower leg pain s/p varicose vein laser treatment in upper leg.  She stated she had noticed it was red and sore the past couple of weeks but seems to be improving now.  Stated she called her vascular surgeon office but unable to get to appt this week due to work  Patient wearing compression stockings and didn't want to go back to clinic to remove them to show me area of concern at this time.  Discussed if worsening pain/red streaks/swelling/tenderness with palpation I recommend she contact her vascular surgeon or seek re-evaluation with another provider if vascular surgeon office closed.   Discussed continue to wear compression socks, elevated legs when sitting, avoid prolonged dependent position and when prolonged sitting try to do calf pumps hourly.  Discussed RN Rolly Salter on vacation this week but will return Monday if she wants to have site evaluated next week I am in clinic Tues/Thur.  I can be reached via my chart or [email or web address] when I am offsite.  Patient verbalized understanding information/instructions, agreed with plan of care and had no further questions at this time.

## 2019-11-22 ENCOUNTER — Emergency Department (HOSPITAL_BASED_OUTPATIENT_CLINIC_OR_DEPARTMENT_OTHER): Payer: No Typology Code available for payment source

## 2019-11-22 ENCOUNTER — Emergency Department (HOSPITAL_BASED_OUTPATIENT_CLINIC_OR_DEPARTMENT_OTHER)
Admission: EM | Admit: 2019-11-22 | Discharge: 2019-11-22 | Disposition: A | Discharge status: Home / Self Care | Payer: No Typology Code available for payment source | Attending: Emergency Medicine | Admitting: Emergency Medicine

## 2019-11-22 ENCOUNTER — Encounter (HOSPITAL_BASED_OUTPATIENT_CLINIC_OR_DEPARTMENT_OTHER): Payer: Self-pay

## 2019-11-22 ENCOUNTER — Other Ambulatory Visit: Payer: Self-pay

## 2019-11-22 DIAGNOSIS — R42 Dizziness and giddiness: Secondary | ICD-10-CM | POA: Diagnosis present

## 2019-11-22 DIAGNOSIS — R7303 Prediabetes: Secondary | ICD-10-CM | POA: Insufficient documentation

## 2019-11-22 DIAGNOSIS — Z79899 Other long term (current) drug therapy: Secondary | ICD-10-CM | POA: Insufficient documentation

## 2019-11-22 DIAGNOSIS — Z86718 Personal history of other venous thrombosis and embolism: Secondary | ICD-10-CM | POA: Insufficient documentation

## 2019-11-22 LAB — CBC WITH DIFFERENTIAL/PLATELET
Abs Immature Granulocytes: 0.01 10*3/uL (ref 0.00–0.07)
Basophils Absolute: 0.1 10*3/uL (ref 0.0–0.1)
Basophils Relative: 1 %
Eosinophils Absolute: 0.2 10*3/uL (ref 0.0–0.5)
Eosinophils Relative: 3 %
HCT: 42.1 % (ref 36.0–46.0)
Hemoglobin: 13.2 g/dL (ref 12.0–15.0)
Immature Granulocytes: 0 %
Lymphocytes Relative: 31 %
Lymphs Abs: 2.2 10*3/uL (ref 0.7–4.0)
MCH: 28.6 pg (ref 26.0–34.0)
MCHC: 31.4 g/dL (ref 30.0–36.0)
MCV: 91.3 fL (ref 80.0–100.0)
Monocytes Absolute: 0.7 10*3/uL (ref 0.1–1.0)
Monocytes Relative: 10 %
Neutro Abs: 3.8 10*3/uL (ref 1.7–7.7)
Neutrophils Relative %: 55 %
Platelets: 219 10*3/uL (ref 150–400)
RBC: 4.61 MIL/uL (ref 3.87–5.11)
RDW: 14.6 % (ref 11.5–15.5)
WBC: 7 10*3/uL (ref 4.0–10.5)
nRBC: 0 % (ref 0.0–0.2)

## 2019-11-22 LAB — BASIC METABOLIC PANEL
Anion gap: 8 (ref 5–15)
BUN: 12 mg/dL (ref 6–20)
CO2: 25 mmol/L (ref 22–32)
Calcium: 8.9 mg/dL (ref 8.9–10.3)
Chloride: 104 mmol/L (ref 98–111)
Creatinine, Ser: 0.59 mg/dL (ref 0.44–1.00)
GFR calc Af Amer: >60 mL/min (ref >60)
GFR calc non Af Amer: >60 mL/min (ref >60)
Glucose, Bld: 122 mg/dL — ABNORMAL HIGH (ref 70–99)
Potassium: 4 mmol/L (ref 3.5–5.1)
Sodium: 137 mmol/L (ref 135–145)

## 2019-11-22 MED ORDER — IOHEXOL 350 MG/ML SOLN
100.0000 mL | Freq: Once | INTRAVENOUS | Status: AC | PRN
  Administered 2019-11-22: 100 mL via INTRAVENOUS

## 2019-11-22 NOTE — ED Triage Notes (Addendum)
Pt became dizzy and lightheaded at work today.after lunch, came on suddenly.  States had been having indigestion intermittently throughout the week, relieved with pepto.  All symptoms resolved now except for a mild headache.  Also states had a recent venous ablation lower extremity which is now bruised in appearance.

## 2019-11-22 NOTE — ED Notes (Signed)
Pt to CT scan.

## 2019-11-25 NOTE — ED Provider Notes (Signed)
[facility] EMERGENCY DEPARTMENT Provider Note   CSN: [id number] Arrival date & time: 11/22/19  1707     History Chief Complaint  Patient presents with  . Dizziness    Danielle Cooke is a 44 y.o. female.  HPI   44 year old female with dizziness.  Vague sensation of not feeling quite right at work prior to arrival.  Now improved.  Denies vertigo.  No acute pain.  No dyspnea.  No nausea, palpitations or diaphoresis.  She was just walking at work when symptoms began.  She felt fine earlier in the day.  Past Medical History:  Diagnosis Date  . Allergy   . DVT (deep venous thrombosis) ([place])   . Peripheral vascular disease [facility])     Patient Active Problem List   Diagnosis Date Noted  . Asthma without status asthmaticus 05/17/2018  . Obesity 05/17/2018  . Seasonal allergic rhinitis 05/17/2018  . 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 Chronic venous insufficiency 04/06/2018  . Superficial thrombosis of lower extremity, right 11/02/2017  . Bilateral lower extremity edema 11/02/2017  . Low serum HDL 11/27/2014  . Prediabetes 11/27/2014  . Vitamin D deficiency 11/27/2014    Past Surgical History:  Procedure Laterality Date  . ANAL FISTULECTOMY    . ENDOVENOUS ABLATION SAPHENOUS VEIN W/ LASER Right 10/25/2019   endovenous laser ablation right greater saphenous vein by Ruta Hinds MD   . [place]       OB History   No obstetric history on file.     Family History  Problem Relation Age of Onset  . Diabetes Mother   . Cancer Father     Social History   Tobacco Use  . Smoking status: Never Smoker  . Smokeless tobacco: Never Used  Substance Use Topics  . Alcohol use: Yes    Comment: 1-2 times a month  . Drug use: No    Home Medications Prior to Admission medications   Medication Sig Start Date End Date Taking? Authorizing Provider  albuterol (PROVENTIL HFA;VENTOLIN HFA) 108 (90 Base) MCG/ACT inhaler Inhale 1-2 puffs into the  lungs every 4 (four) hours as needed for wheezing or shortness of breath. 04/14/18  Yes Wendie Agreste, MD  Biotin 10 MG CAPS Take by mouth.    [provider]  clotrimazole-betamethasone (LOTRISONE) cream Apply 1 application topically 2 (two) times daily. 05/27/18   Wendie Agreste, MD  Rivaroxaban 15 & 20 MG TBPK Take as directed on package: Start with one 15mg  tablet by mouth twice a day with food. On Day 22, switch to one 20mg  tablet once a day with food. 03/03/19   Ward, Ozella Almond, PA-C  triamcinolone cream (KENALOG) 0.1 % Apply 1 application topically 2 (two) times daily. 09/26/18   Betancourt, Aura Fey, NP  vitamin B-12 (CYANOCOBALAMIN) 250 MCG tablet Take 250 mcg by mouth daily.    [provider]    Allergies    Patient has no known allergies.  Review of Systems   Review of Systems All systems reviewed and negative, other than as noted in HPI.  Physical Exam Updated Vital Signs BP 119/66 (BP Location: Right Arm)   Pulse (!) 58   Temp 98.6 F (37 C) (Oral)   Resp 20   SpO2 99%   Physical Exam Vitals and nursing note reviewed.  Constitutional:      General: She is not in acute distress.    Appearance: She is well-developed. She is obese.  HENT:  Head: Normocephalic and atraumatic.  Eyes:     General:        Right eye: No discharge.        Left eye: No discharge.     Conjunctiva/sclera: Conjunctivae normal.  Cardiovascular:     Rate and Rhythm: Normal rate and regular rhythm.     Heart sounds: Normal heart sounds. No murmur. No friction rub. No gallop.   Pulmonary:     Effort: Pulmonary effort is normal. No respiratory distress.     Breath sounds: Normal breath sounds.  Abdominal:     General: There is no distension.     Palpations: Abdomen is soft.     Tenderness: There is no abdominal tenderness.  Musculoskeletal:        General: No tenderness.     Cervical back: Neck supple.  Skin:    General: Skin is warm and dry.  Neurological:      General: No focal deficit present.     Mental Status: She is alert and oriented to person, place, and time.     Cranial Nerves: No cranial nerve deficit.     Sensory: No sensory deficit.     Motor: No weakness.     Comments: Good finger-nose testing bilaterally.  Psychiatric:        Behavior: Behavior normal.        Thought Content: Thought content normal.     ED Results / Procedures / Treatments   Labs (all labs ordered are listed, but only abnormal results are displayed) Labs Reviewed  BASIC METABOLIC PANEL - Abnormal; Notable for the following components:      Result Value   Glucose, Bld 122 (*)    All other components within normal limits  CBC WITH DIFFERENTIAL/PLATELET    EKG None  Radiology No results found.  Procedures Procedures (including critical care time)  Medications Ordered in ED Medications  iohexol (OMNIPAQUE) 350 MG/ML injection 100 mL (100 mLs Intravenous Contrast Given 11/22/19 1853)    ED Course  I have reviewed the triage vital signs and the nursing notes.  Pertinent labs & imaging results that were available during my care of the patient were reviewed by me and considered in my medical decision making (see chart for details).    MDM Rules/Calculators/A&P                       Final Clinical Impression(s) / ED Diagnoses Final diagnoses:  Dizziness    Rx / DC Orders ED Discharge Orders    None       Raeford Razor, MD 11/25/19 1801

## 2019-12-08 ENCOUNTER — Other Ambulatory Visit (INDEPENDENT_AMBULATORY_CARE_PROVIDER_SITE_OTHER): Payer: Self-pay | Admitting: Family Medicine

## 2019-12-19 ENCOUNTER — Encounter: Payer: Self-pay | Admitting: Registered Nurse

## 2019-12-19 ENCOUNTER — Ambulatory Visit: Payer: Self-pay | Admitting: Registered Nurse

## 2019-12-19 ENCOUNTER — Other Ambulatory Visit: Payer: Self-pay

## 2019-12-19 VITALS — BP 128/84 | HR 78 | Temp 99.3°F | Resp 16 | Ht 61.0 in | Wt 280.0 lb

## 2019-12-19 DIAGNOSIS — M7989 Other specified soft tissue disorders: Secondary | ICD-10-CM

## 2019-12-19 DIAGNOSIS — R202 Paresthesia of skin: Secondary | ICD-10-CM

## 2019-12-19 DIAGNOSIS — L309 Dermatitis, unspecified: Secondary | ICD-10-CM

## 2019-12-19 DIAGNOSIS — J301 Allergic rhinitis due to pollen: Secondary | ICD-10-CM

## 2019-12-19 DIAGNOSIS — R12 Heartburn: Secondary | ICD-10-CM

## 2019-12-19 DIAGNOSIS — R002 Palpitations: Secondary | ICD-10-CM

## 2019-12-19 MED ORDER — OMEPRAZOLE 20 MG PO CPDR: 20.0000 mg | DELAYED_RELEASE_CAPSULE | Freq: Every day | ORAL | 0 refills | Status: DC

## 2019-12-19 NOTE — Progress Notes (Signed)
Subjective:    Patient ID: Danielle Cooke, female    DOB: 07-24-1976, 44 y.o.   MRN: [id number]  44y/o Caucasian established female pt reporting ER visit almost a month ago related to similar c/o dizziness, vague feeling of "not quite right". Had a burning sensation move from base of neck up back of head that became a headache. Sx intermittent since then. Today has feeling of something "bubbling or gurgling when I breath in in this area" pointing to R chest. Has also noticed increase in PND, likely allergy related and this has led to an intermittent mild cough and throat clearing. Does not typically take anything for seasonal allergies aside from Flonase one spray once daily. RN Rolly Salter advised pt yesterday to increase Flonase to 1 spray each nostril BID and start nasal saline spray and Claritin, both given from clinic OTC.  Last weight on file for be well 272lbs Feb 2020  Was seen in ER Mar 2021 Kathryne Sharper [facility] CT/labs for headache/dizzyness.  Work up unremarkable per epic review. Last labs and PCM visit over a year ago.  Using triamcinolone cream on leg for rash and improving.  Second covid vaccination [employer]izer 30 Nov 2019 cannot remember date of first but does have vaccination card at home.  Concerned this breathing could be related to vaccination side effects.  Had sinus issues a week ago upper teeth pain took motrin and using flonase and nasal saline and pain resolved.  Has noticed she can hear her heartbeat especially when exerting.  Sometimes notices heart rate irregular but not currently having symptoms.  Has noticed intermittent tingling/numbness right wrist/forearm also.  Denied hand weakness or difficulty performing ADLs/work duties.     Review of Systems  Constitutional: Negative for activity change, appetite change, chills, diaphoresis, fatigue, fever and unexpected weight change.  HENT: Positive for congestion, postnasal drip, rhinorrhea, sinus pressure and sinus pain. Negative for dental  problem, drooling, ear discharge, facial swelling, hearing loss, mouth sores, nosebleeds, trouble swallowing and voice change.   Eyes: Negative for photophobia, pain, discharge, redness, itching and visual disturbance.  Respiratory: Positive for cough. Negative for shortness of breath, wheezing and stridor.   Cardiovascular: Positive for palpitations and leg swelling. Negative for chest pain.  Gastrointestinal: Negative for abdominal distention, abdominal pain, anal bleeding, blood in stool, constipation, diarrhea, nausea, rectal pain and vomiting.  Endocrine: Negative for cold intolerance and heat intolerance.  Genitourinary: Negative for difficulty urinating.  Musculoskeletal: Positive for myalgias. Negative for arthralgias, back pain, gait problem, neck pain and neck stiffness.  Skin: Positive for color change and rash. Negative for pallor and wound.  Allergic/Immunologic: Positive for environmental allergies. Negative for food allergies.  Neurological: Positive for dizziness, numbness and headaches. Negative for tremors, seizures, syncope, facial asymmetry, speech difficulty and light-headedness.  Hematological: Negative for adenopathy. Does not bruise/bleed easily.  Psychiatric/Behavioral: Negative for agitation, confusion and sleep disturbance.       Objective:   Physical Exam Vitals and nursing note reviewed.  Constitutional:      General: She is awake. She is not in acute distress.    Appearance: Normal appearance. She is well-developed and well-groomed. She is morbidly obese. She is not ill-appearing, toxic-appearing or diaphoretic.  HENT:     Head: Normocephalic and atraumatic.     Jaw: There is normal jaw occlusion. No trismus, tenderness, swelling, pain on movement or malocclusion.     Salivary Glands: Right salivary gland is not diffusely enlarged or tender. Left salivary gland is not diffusely enlarged  or tender.     Right Ear: Hearing, ear canal and external ear normal. A  middle ear effusion is present. There is no impacted cerumen.     Left Ear: Hearing, ear canal and external ear normal. A middle ear effusion is present. There is no impacted cerumen.     Nose: Mucosal edema present. No nasal deformity, septal deviation, signs of injury, laceration, nasal tenderness, congestion or rhinorrhea.     Right Sinus: No maxillary sinus tenderness or frontal sinus tenderness.     Left Sinus: No maxillary sinus tenderness or frontal sinus tenderness.     Mouth/Throat:     Lips: Pink. No lesions.     Mouth: Mucous membranes are moist. Mucous membranes are not pale, not dry and not cyanotic. No lacerations, oral lesions or angioedema.     Dentition: Normal dentition. Does not have dentures. No dental tenderness, gingival swelling, dental caries, dental abscesses or gum lesions.     Tongue: No lesions. Tongue does not deviate from midline.     Palate: No mass and lesions.     Pharynx: Uvula midline. Pharyngeal swelling and posterior oropharyngeal erythema present. No oropharyngeal exudate or uvula swelling.     Tonsils: No tonsillar exudate or tonsillar abscesses. 0 on the right. 0 on the left.     Comments: Cobblestoning posterior pharynx; bilateral tms air fluid level clear; bilateral allergic shiners; clear discharge bilateral nasal turbinates Eyes:     General: Lids are normal. Vision grossly intact. Gaze aligned appropriately. Allergic shiner present. No visual field deficit or scleral icterus.       Right eye: No foreign body, discharge or hordeolum.        Left eye: No foreign body, discharge or hordeolum.     Extraocular Movements: Extraocular movements intact.     Right eye: Normal extraocular motion and no nystagmus.     Left eye: Normal extraocular motion and no nystagmus.     Conjunctiva/sclera: Conjunctivae normal.     Right eye: Right conjunctiva is not injected. No chemosis, exudate or hemorrhage.    Left eye: Left conjunctiva is not injected. No chemosis,  exudate or hemorrhage.    Pupils: Pupils are equal, round, and reactive to light. Pupils are equal.     Right eye: Pupil is round and reactive.     Left eye: Pupil is round and reactive.  Neck:     Thyroid: No thyroid mass, thyromegaly or thyroid tenderness.     Vascular: No carotid bruit.     Trachea: Trachea and phonation normal. No tracheal tenderness or tracheal deviation.  Cardiovascular:     Rate and Rhythm: Normal rate and regular rhythm.     Chest Wall: PMI is not displaced.     Pulses:          Radial pulses are 2+ on the right side and 2+ on the left side.     Heart sounds: Normal heart sounds, S1 normal and S2 normal. Heart sounds not distant. No murmur. No friction rub. No gallop. No S3 or S4 sounds.   Pulmonary:     Effort: Pulmonary effort is normal. No accessory muscle usage or respiratory distress.     Breath sounds: Normal breath sounds and air entry. No stridor, decreased air movement or transmitted upper airway sounds. No decreased breath sounds, wheezing, rhonchi or rales.     Comments: Wearing cloth mask due to covid 19 pandemic; spoke full sentences without difficulty; no cough observed in exam room  or clinic Chest:     Chest wall: No tenderness.  Abdominal:     General: Abdomen is flat. Bowel sounds are decreased. There is no distension.     Palpations: Abdomen is soft. There is no shifting dullness, fluid wave, hepatomegaly, splenomegaly, mass or pulsatile mass.     Tenderness: There is no abdominal tenderness. There is no right CVA tenderness, left CVA tenderness or guarding.  Musculoskeletal:        General: Swelling and tenderness present. No deformity or signs of injury. Normal range of motion.     Right shoulder: Normal.     Left shoulder: Normal.     Right hand: Normal.     Left hand: Normal.     Cervical back: Normal range of motion and neck supple. No edema, erythema, signs of trauma, rigidity, torticollis, tenderness or crepitus. No pain with movement,  spinous process tenderness or muscular tenderness. Normal range of motion.     Right hip: Normal.     Left hip: Normal.     Right knee: Normal.     Left knee: Normal.     Right lower leg: Swelling and tenderness present. No deformity, lacerations or bony tenderness. 2+ Pitting Edema present.     Left lower leg: Swelling present. No deformity, lacerations, tenderness or bony tenderness. 1+ Edema present.  Lymphadenopathy:     Head:     Right side of head: No submental, submandibular, tonsillar, preauricular, posterior auricular or occipital adenopathy.     Left side of head: No submental, submandibular, tonsillar, preauricular, posterior auricular or occipital adenopathy.     Cervical: No cervical adenopathy.     Right cervical: No superficial, deep or posterior cervical adenopathy.    Left cervical: No superficial, deep or posterior cervical adenopathy.  Skin:    General: Skin is warm and dry.     Capillary Refill: Capillary refill takes less than 2 seconds.     Coloration: Skin is not ashen, cyanotic, jaundiced, mottled, pale or sallow.     Findings: No abrasion, abscess, acne, bruising, burn, ecchymosis, erythema, signs of injury, laceration, lesion, petechiae, rash or wound.     Nails: There is no clubbing.  Neurological:     General: No focal deficit present.     Mental Status: She is alert and oriented to person, place, and time. Mental status is at baseline. She is not disoriented.     GCS: GCS eye subscore is 4. GCS verbal subscore is 5. GCS motor subscore is 6.     Cranial Nerves: Cranial nerves are intact. No cranial nerve deficit, dysarthria or facial asymmetry.     Sensory: Sensation is intact. No sensory deficit.     Motor: Motor function is intact. No weakness, tremor, atrophy, abnormal muscle tone or seizure activity.     Coordination: Coordination is intact. Coordination normal.     Gait: Gait is intact. Gait normal.     Comments: Gait sure and steady in exam room; on/off  exam table and in/out of chair without difficulty; bilateral hand grasp equal 5/5  Psychiatric:        Attention and Perception: Attention and perception normal.        Mood and Affect: Mood and affect normal.        Speech: Speech normal.        Behavior: Behavior normal. Behavior is cooperative.        Thought Content: Thought content normal.        Cognition  and Memory: Cognition and memory normal.        Judgment: Judgment normal.           Assessment & Plan:  A-heartburn, leg swelling, paresthesias right hand, palpitations, dermatitis lower extremity subsequent visit, seasonal allergic rhinitis due to pollen  P-trial of omeprazole DR 20mg  po daily x 2 weeks #90 RF0 take with food and dispensed from [facility] to patient.  Anti-reflux measures such as raising the head of the bed, avoiding tight clothing or belts, avoiding eating late at night and not lying down shortly after mealtime and achieving weight loss were discussed. Avoid ASA, NSAID's, caffeine, peppermints, alcohol and tobacco. OTC H2 blockers and/or antacids are often very helpful for PRN use.  However, for chronic or daily symptoms, prescription strength H2 blockers or a trial of PPI's should be used.  Patient should alert me if there are persistent symptoms, dysphagia, weight loss or GI bleeding (bright red or black). Exitcare handout printed and given on heartburn to patient.  Follow up with clinic provider or PCM if symptoms persist despite therapy. Patient verbalized agreement and understanding of treatment plan and had no further questions at this time.     PE deferred as patient would need to remove pants and she preferred not to do so today.  Continue triamcinolone topical BID x 2 weeks avoid scratching affected area.  May apply ice 15 minutes QID prn itching or calagel BID prn itching along with OTC antihistamine use of her choice.  Follow up re-evaluation if no improvement or worsening of symptoms with plan of care.  Patient  verbalized understanding information/instructions, agreed with plan of care and had no further questions at this time.  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  Left without pitting edema and right mild.  No compression stockings yesterday as her pair yesterday were very painful and felt to her like cutting off her circulation to foot so she worse leggings today without compression stockings.  Continue elevating legs when sitting and using compression stockings every day.  Encouraged activity, calf pumps hourly when sitting at desk or standing up to walk in cubicle when able.  Follow up re-evaluation if no improvement with plan of care and if hot/red spot, worsening pain/tenderness/swelling.  Patient verbalized understanding information/instructions, agreed with plan of care and had no further questions at this time.  Reassured patient BBS CTA/WNL and sp02 with speech/talking 100% RA.  No fluid heard in lungs.  Does have seasonal allergy symptoms could noted some bronchial irritation from post nasal drip.  Discussed signs and symptoms of AMI in women diaphoresis, heartburn, n/v, neck/jaw/left arm pain, elephant on chest.  Labs exec panel fasting plus Hgba1c to be scheduled in next two weeks and schedule annual with PCM after that.  Trial PPI x 2 weeks to see if heartburn improves.  Consider activity-low impact walking working up to 150 minutes exercise total per week.  Discussed her symptoms could be respiratory or cardiovascular.  No EKG available in our clinic PCM would need to do.  PE except for leg swelling normal today.  Discussed palpitations could be due to medications, caffeine intake, stress,  thyroid high/low, electrolyte imbalance or internal heart irregularity causing electrical disturbance.  BMI 52--8lbs weight gain since Be Well Feb 2020 during covid pandemic.  Exitcare handout  printed and given on palpitations to patient today.  Patient verbalized understanding information/instructions,  agreed with plan of care and had no further questions at this time.  Patient may use normal saline nasal spray 2 sprays each nostril q2h wa as needed. flonase 1 spray each nostril BID Patient denied personal or family history of ENT cancer.  OTC antihistamine of choice claritin/zyrtec  po daily.  Avoid triggers if possible.  Shower prior to bedtime if exposed to triggers.  If allergic dust/dust mites recommend mattress/pillow covers/encasements; washing linens, vacuuming, sweeping, dusting weekly.  Call or return to clinic as needed if these symptoms worsen or fail to improve as anticipated.  Patient verbalized understanding of instructions, agreed with plan of care and had no further questions at this time.  P2:  Avoidance and hand washing.  Paresthesias could be due to pinched nerve/carpal tunnel, electrolytes high/low, Vitamin D high/low, B6/B12/zinc high/low.  Discussed/demonstrated exercises see exitcare printout exercises and how to prevent carpal tunnel syndrome.  Discussed ergonomics at her desk.  May use cryotherapy 15 minutes four times a day also especially if swelling noted forearm/wrist/hands.   Labs to be scheduled and will re-evaluate at follow up visit.  Consider wrist splint if no improvement with cryotherapy/stretches.  Patient verbalized understanding information/instructions, agreed with plan of care and had no further questions at this time.  P2:  Diet, Food avoidance that aggravate condition, and Fitness

## 2019-12-19 NOTE — Patient Instructions (Addendum)
CARPAL TUNNEL (Nerve Compression Syndrome): Wrist Stretch   Preventing Carpal Tunnel Syndrome  Carpal tunnel syndrome is a condition that causes pain, numbness, and weakness in the wrist, hand, and fingers. The carpal tunnel is a narrow, hollow space in the wrist. Tendons and one of the main nerves in the hand (median nerve) pass through the carpal tunnel. The median nerve supplies feeling to the thumb and the first three fingers. It also supplies the muscles at the base of the thumb. Carpal tunnel syndrome happens when the median nerve gets squeezed in the area where it passes through the carpal tunnel. In some cases, it may not be possible to prevent carpal tunnel syndrome. However, you can take steps to relieve pressure on your wrist and reduce your risk of developing this condition. How can this condition affect me? Carpal tunnel syndrome can affect your ability to do jobs or activities that involve hand, wrist, and finger action. It can cause symptoms such as:  Pain in the wrist, hand, and fingers.  Burning, tingling, or numbness in the affected area.  A weak feeling in your hands. You may have trouble grabbing and holding items. Symptoms may get worse over time. For some people, symptoms get worse at night. What can increase my risk? The following factors may make you more likely to develop this condition:  Having a job that requires you to repeatedly move your wrist or requires you to use tools that vibrate. This may include jobs that involve using computers, working on an [employer], or working with power tools such as Radiographer, therapeutic.  Being a woman.  Having a family history of the condition.  Having certain conditions, such as: ? Diabetes. ? Pregnancy. ? Obesity. ? Thyroid disease. ? Rheumatoid arthritis. What actions can I take to help prevent this condition?      Avoid making repetitive hand and wrist motions that cause your wrist to get stiff or painful.  Take  frequent breaks if you use your hands and wrists for many hours at a time.  Stretch your hands and fingers often to get blood flowing and relieve tension.  Keep your wrists in the natural position when using a computer keyboard or mouse. Do not bend your wrists downward or sideways.  If you use your hands and wrists for many hours at work, make changes to your work space to ease pressure on your wrists. You may want to use: ? A padded wrist rest for computer work. ? A slanted computer keyboard. ? Hand tools with padded handles to reduce vibrations.  Consider wearing a wrist brace. This will not prevent carpal tunnel syndrome but may keep it from getting worse. A wrist brace reduces bending and stress.  Closely manage any medical conditions you have that can put you at risk for carpal tunnel syndrome. Have your blood sugar checked to make sure you are not developing diabetes. If you have diabetes, work with your health care provider to keep your blood sugar under control. Where to find more information  [employer] of Neurological Disorders and Stroke: BasicFM.no  American Academy of Family Physicians: Hydrologist.org Contact a health care provider if:  You have numbness or tingling in your wrist, hand, or fingers.  You have pain or a burning sensation in your wrist, hand, or fingers.  Pain, tingling, or burning wakes you up at night.  Your hand becomes weak and clumsy.  You frequently drop objects.  You are unable to use your wrists and  hands without pain. Summary  Carpal tunnel syndrome is a condition that causes pain, numbness, and weakness in the wrist, hand, and fingers.  You can take steps to relieve pressure on your wrist and reduce your risk of developing this condition.  Avoid making repetitive hand and wrist motions that cause your wrist to get stiff or painful.  If you use your hands and wrists for many hours at work, you may want to make changes to your  work space to ease pressure on your wrists.  Take frequent breaks to stretch your hands and fingers. This information is not intended to replace advice given to you by your health care provider. Make sure you discuss any questions you have with your health care provider. Document Revised: 12/23/2017 Document Reviewed: 12/23/2017 Elsevier Patient Education  2020 Elsevier Inc.   Extend right arm with fingers facing down. With left hand, gently pull fingers of right hand toward body. Hold position for __5_ breaths. Repeat with arms switched. Repeat _3__ times, alternating arms. Do _3__ times per day.  Copyright  VHI. All rights reserved.  CARPAL TUNNEL (Nerve Compression Syndrome): Wrist Rotation    Make a loose fist with each hand and rotate at the wrists in one direction _3__ times. Repeat in other direction. Do _3__ times per day.  Copyright  VHI. All rights reserved.  CARPAL TUNNEL (Nerve Compression Syndrome): Reverse [place]aste    Reach behind back and bring palms together with fingers up. Hold position for _5__ breaths. Repeat__3_ times. Do _3__ times per day.  Copyright  VHI. All rights reserved.  CARPAL TUNNEL (Nerve Compression Syndrome): [place]    Bring right arm in front, elbow bent, forearm up. Reach left arm under right and, if possible, bring left fingers into right palm, thumbs facing body. (If not able to bring fingers into palm, just hold position where comfortable.) Hold position for _5__ breaths. Switch arms and repeat. Repeat _3__ times. Do __3_ times per day.  Copyright  VHI. All rights reserved.  Paresthesia Paresthesia is an abnormal burning or prickling sensation. It is usually felt in the hands, arms, legs, or feet. However, it may occur in any part of the body. Usually, paresthesia is not painful. It may feel like:  Tingling or numbness.  Buzzing.  Itching. Paresthesia may occur without any clear cause, or it may be caused by:  Breathing too  quickly (hyperventilation).  Pressure on a nerve.  An underlying medical condition.  Side effects of a medication.  Nutritional deficiencies.  Exposure to toxic chemicals. Most people experience temporary (transient) paresthesia at some time in their lives. For some people, it may be long-lasting (chronic) because of an underlying medical condition. If you have paresthesia that lasts a long time, you may need to be evaluated by your health care provider. Follow these instructions at home: Alcohol use   Do not drink alcohol if: ? Your health care provider tells you not to drink. ? You are pregnant, may be pregnant, or are planning to become pregnant.  If you drink alcohol: ? Limit how much you use to:  0-1 drink a day for women.  0-2 drinks a day for men. ? Be aware of how much alcohol is in your drink. In the [place] one drink equals one 12 oz bottle of beer (355 mL), one 5 oz glass of wine (148 mL), or one 1 oz glass of hard liquor (44 mL). Nutrition   Eat a healthy diet. This includes: ? Eating foods  that are high in fiber, such as fresh fruits and vegetables, whole grains, and beans. ? Limiting foods that are high in fat and processed sugars, such as fried or sweet foods. General instructions  Take over-the-counter and prescription medicines only as told by your health care provider.  Do not use any products that contain nicotine or tobacco, such as cigarettes and e-cigarettes. These can keep blood from reaching damaged nerves. If you need help quitting, ask your health care provider.  If you have diabetes, work closely with your health care provider to keep your blood sugar under control.  If you have numbness in your feet: ? Check every day for signs of injury or infection. Watch for redness, warmth, and swelling. ? Wear padded socks and comfortable shoes. These help protect your feet.  Keep all follow-up visits as told by your health care provider. This is  important. Contact a health care provider if you:  Have paresthesia that gets worse or does not go away.  Have a burning or prickling feeling that gets worse when you walk.  Have pain, cramps, or dizziness.  Develop a rash. Get help right away if you:  Feel weak.  Have trouble walking or moving.  Have problems with speech, understanding, or vision.  Feel confused.  Cannot control your bladder or bowel movements.  Have numbness after an injury.  Develop new weakness in an arm or leg.  Faint. Summary  Paresthesia is an abnormal burning or prickling sensation that is usually felt in the hands, arms, legs, or feet. It may also occur in other parts of the body.  Paresthesia may occur without any clear cause, or it may be caused by breathing too quickly (hyperventilation), pressure on a nerve, an underlying medical condition, side effects of a medication, nutritional deficiencies, or exposure to toxic chemicals.  If you have paresthesia that lasts a long time, you may need to be evaluated by your health care provider. This information is not intended to replace advice given to you by your health care provider. Make sure you discuss any questions you have with your health care provider. Document Revised: 09/05/2018 Document Reviewed: 08/19/2017 Elsevier Patient Education  2020 [employer]. Palpitations Palpitations are feelings that your heartbeat is irregular or is faster than normal. It may feel like your heart is fluttering or skipping a beat. Palpitations are usually not a serious problem. They may be caused by many things, including smoking, caffeine, alcohol, stress, and certain medicines or drugs. Most causes of palpitations are not serious. However, some palpitations can be a sign of a serious problem. You may need further tests to rule out serious medical problems. Follow these instructions at home:     Pay attention to any changes in your condition. Take these actions  to help manage your symptoms: Eating and drinking  Avoid foods and drinks that may cause palpitations. These may include: ? Caffeinated coffee, tea, soft drinks, diet pills, and energy drinks. ? Chocolate. ? Alcohol. Lifestyle  Take steps to reduce your stress and anxiety. Things that can help you relax include: ? Yoga. ? Mind-body activities, such as deep breathing, meditation, or using words and images to create positive thoughts (guided imagery). ? Physical activity, such as swimming, jogging, or walking. Tell your health care provider if your palpitations increase with activity. If you have chest pain or shortness of breath with activity, do not continue the activity until you are seen by your health care provider. ? Biofeedback. This is a  method that helps you learn to use your mind to control things in your body, such as your heartbeat.  Do not use drugs, including cocaine or ecstasy. Do not use marijuana.  Get plenty of rest and sleep. Keep a regular bed time. General instructions  Take over-the-counter and prescription medicines only as told by your health care provider.  Do not use any products that contain nicotine or tobacco, such as cigarettes and e-cigarettes. If you need help quitting, ask your health care provider.  Keep all follow-up visits as told by your health care provider. This is important. These may include visits for further testing if palpitations do not go away or get worse. Contact a health care provider if you:  Continue to have a fast or irregular heartbeat after 24 hours.  Notice that your palpitations occur more often. Get help right away if you:  Have chest pain or shortness of breath.  Have a severe headache.  Feel dizzy or you faint. Summary  Palpitations are feelings that your heartbeat is irregular or is faster than normal. It may feel like your heart is fluttering or skipping a beat.  Palpitations may be caused by many things, including  smoking, caffeine, alcohol, stress, certain medicines, and drugs.  Although most causes of palpitations are not serious, some causes can be a sign of a serious medical problem.  Get help right away if you faint or have chest pain, shortness of breath, a severe headache, or dizziness. This information is not intended to replace advice given to you by your health care provider. Make sure you discuss any questions you have with your health care provider. Document Revised: 09/22/2017 Document Reviewed: 09/22/2017 Elsevier Patient Education  2020 Elsevier Inc. Heartburn Heartburn is a type of pain or discomfort that can happen in the throat or chest. It is often described as a burning pain. It may also cause a bad, acid-like taste in the mouth. Heartburn may feel worse when you lie down or bend over, and it is often worse at night. Heartburn may be caused by stomach contents that move back up into the esophagus (reflux). Follow these instructions at home: Eating and drinking   Avoid certain foods and drinks as told by your health care provider. This may include: ? Coffee and tea (with or without caffeine). ? Drinks that contain alcohol. ? Energy drinks and sports drinks. ? Carbonated drinks or sodas. ? Chocolate and cocoa. ? Peppermint and mint flavorings. ? Garlic and onions. ? Horseradish. ? Spicy and acidic foods, including peppers, chili powder, curry powder, vinegar, hot sauces, and barbecue sauce. ? Citrus fruit juices and citrus fruits, such as oranges, lemons, and limes. ? Tomato-based foods, such as red sauce, chili, salsa, and pizza with red sauce. ? Fried and fatty foods, such as donuts, french fries, potato chips, and high-fat dressings. ? High-fat meats, such as hot dogs and fatty cuts of red and white meats, such as rib eye steak, sausage, ham, and bacon. ? High-fat dairy items, such as whole milk, butter, and cream cheese.  Eat small, frequent meals instead of large  meals.  Avoid drinking large amounts of liquid with your meals.  Avoid eating meals during the 2-3 hours before bedtime.  Avoid lying down right after you eat.  Do not exercise right after you eat. Lifestyle      If you are overweight, reduce your weight to an amount that is healthy for you. Ask your health care provider for guidance about a  safe weight loss goal.  Do not use any products that contain nicotine or tobacco, such as cigarettes, e-cigarettes, and chewing tobacco. These can make your symptoms worse. If you need help quitting, ask your health care provider.  Wear loose-fitting clothing. Do not wear anything tight around your waist that causes pressure on your abdomen.  Raise (elevate) the head of your bed about 6 inches (15 cm) when you sleep.  Try to reduce your stress, such as with yoga or meditation. If you need help reducing stress, ask your health care provider. General instructions  Pay attention to any changes in your symptoms.  Take over-the-counter and prescription medicines only as told by your health care provider. ? Do not take aspirin, ibuprofen, or other NSAIDs unless your health care provider told you to do so. ? Stop medicines only as told by your health care provider. If you stop taking some medicines too quickly, your symptoms may get worse.  Keep all follow-up visits as told by your health care provider. This is important. Contact a health care provider if:  You have new symptoms.  You have unexplained weight loss.  You have difficulty swallowing, or it hurts to swallow.  You have wheezing or a persistent cough.  Your symptoms do not improve with treatment.  You have frequent heartburn for more than 2 weeks. Get help right away if:  You have pain in your arms, neck, jaw, teeth, or back.  You feel sweaty, dizzy, or light-headed.  You have chest pain or shortness of breath.  You vomit and your vomit looks like blood or coffee  grounds.  Your stool is bloody or black. These symptoms may represent a serious problem that is an emergency. Do not wait to see if the symptoms will go away. Get medical help right away. Call your local emergency services (911 in the [place] Do not drive yourself to the hospital. Summary  Heartburn is a type of pain or discomfort that can happen in the throat or chest. It is often described as a burning pain. It may also cause a bad, acid-like taste in the mouth.  Avoid certain foods and drinks as told by your health care provider.  Take over-the-counter and prescription medicines only as told by your health care provider. Do not take aspirin, ibuprofen, or other NSAIDs unless your health care provider told you to do so.  Contact a health care provider if your symptoms do not improve or they get worse. This information is not intended to replace advice given to you by your health care provider. Make sure you discuss any questions you have with your health care provider. Document Revised: 01/10/2018 Document Reviewed: 01/10/2018 Elsevier Patient Education  [place].

## 2019-12-28 ENCOUNTER — Other Ambulatory Visit: Payer: Self-pay

## 2019-12-28 ENCOUNTER — Ambulatory Visit: Payer: Self-pay | Admitting: *Deleted

## 2019-12-28 DIAGNOSIS — R202 Paresthesia of skin: Secondary | ICD-10-CM

## 2019-12-28 DIAGNOSIS — M7989 Other specified soft tissue disorders: Secondary | ICD-10-CM

## 2019-12-28 DIAGNOSIS — R002 Palpitations: Secondary | ICD-10-CM

## 2019-12-28 NOTE — Progress Notes (Signed)
Fasting labs per pending orders.

## 2020-01-01 LAB — VITAMIN B12: Vitamin B-12: 427 pg/mL (ref 232–1245)

## 2020-01-01 LAB — CMP12+LP+TP+TSH+6AC+CBC/D/PLT
ALT: 19 IU/L (ref 0–32)
AST: 17 IU/L (ref 0–40)
Albumin/Globulin Ratio: 1.4 (ref 1.2–2.2)
Albumin: 4 g/dL (ref 3.8–4.8)
Alkaline Phosphatase: 62 IU/L (ref 39–117)
BUN/Creatinine Ratio: 14 (ref 9–23)
BUN: 11 mg/dL (ref 6–24)
Basophils Absolute: 0 10*3/uL (ref 0.0–0.2)
Basos: 1 %
Bilirubin Total: 0.4 mg/dL (ref 0.0–1.2)
Calcium: 8.7 mg/dL (ref 8.7–10.2)
Chloride: 104 mmol/L (ref 96–106)
Chol/HDL Ratio: 3.8 ratio (ref 0.0–4.4)
Cholesterol, Total: 152 mg/dL (ref 100–199)
Creatinine, Ser: 0.76 mg/dL (ref 0.57–1.00)
EOS (ABSOLUTE): 0.2 10*3/uL (ref 0.0–0.4)
Eos: 3 %
Estimated CHD Risk: 0.7 times avg. (ref 0.0–1.0)
Free Thyroxine Index: 1.9 (ref 1.2–4.9)
GFR calc Af Amer: 109 mL/min/{1.73_m2} (ref >59)
GFR calc non Af Amer: 94 mL/min/{1.73_m2} (ref >59)
GGT: 16 IU/L (ref 0–60)
Globulin, Total: 2.8 g/dL (ref 1.5–4.5)
Glucose: 147 mg/dL — ABNORMAL HIGH (ref 65–99)
HDL: 40 mg/dL (ref >39)
Hematocrit: 39.9 % (ref 34.0–46.6)
Hemoglobin: 13 g/dL (ref 11.1–15.9)
Immature Grans (Abs): 0 10*3/uL (ref 0.0–0.1)
Immature Granulocytes: 0 %
Iron: 75 ug/dL (ref 27–159)
LDH: 174 IU/L (ref 119–226)
LDL Chol Calc (NIH): 96 mg/dL (ref 0–99)
Lymphocytes Absolute: 1.6 10*3/uL (ref 0.7–3.1)
Lymphs: 31 %
MCH: 28.8 pg (ref 26.6–33.0)
MCHC: 32.6 g/dL (ref 31.5–35.7)
MCV: 88 fL (ref 79–97)
Monocytes Absolute: 0.5 10*3/uL (ref 0.1–0.9)
Monocytes: 10 %
Neutrophils Absolute: 2.9 10*3/uL (ref 1.4–7.0)
Neutrophils: 55 %
Phosphorus: 3.1 mg/dL (ref 3.0–4.3)
Platelets: 191 10*3/uL (ref 150–450)
Potassium: 4.4 mmol/L (ref 3.5–5.2)
RBC: 4.52 x10E6/uL (ref 3.77–5.28)
RDW: 13 % (ref 11.7–15.4)
Sodium: 141 mmol/L (ref 134–144)
T3 Uptake Ratio: 26 % (ref 24–39)
T4, Total: 7.3 ug/dL (ref 4.5–12.0)
TSH: 2.3 u[IU]/mL (ref 0.450–4.500)
Total Protein: 6.8 g/dL (ref 6.0–8.5)
Triglycerides: 82 mg/dL (ref 0–149)
Uric Acid: 5.2 mg/dL (ref 2.6–6.2)
VLDL Cholesterol Cal: 16 mg/dL (ref 5–40)
WBC: 5.2 10*3/uL (ref 3.4–10.8)

## 2020-01-01 LAB — HGB A1C W/O EAG: Hgb A1c MFr Bld: 7.3 % — ABNORMAL HIGH (ref 4.8–5.6)

## 2020-01-01 LAB — VITAMIN D 25 HYDROXY (VIT D DEFICIENCY, FRACTURES): Vit D, 25-Hydroxy: 16.5 ng/mL — ABNORMAL LOW (ref 30.0–100.0)

## 2020-01-01 LAB — VITAMIN B6: Vitamin B6: 6.6 ug/L (ref 2.0–32.8)

## 2020-01-03 ENCOUNTER — Other Ambulatory Visit (INDEPENDENT_AMBULATORY_CARE_PROVIDER_SITE_OTHER): Payer: Self-pay | Admitting: Family Medicine

## 2020-01-04 ENCOUNTER — Encounter: Payer: Self-pay | Admitting: Physician Assistant

## 2020-01-04 ENCOUNTER — Ambulatory Visit: Payer: Self-pay | Admitting: Physician Assistant

## 2020-01-04 ENCOUNTER — Other Ambulatory Visit: Payer: Self-pay

## 2020-01-04 VITALS — BP 112/86 | HR 82 | Temp 99.1°F

## 2020-01-04 DIAGNOSIS — Z8672 Personal history of thrombophlebitis: Secondary | ICD-10-CM

## 2020-01-04 DIAGNOSIS — I872 Venous insufficiency (chronic) (peripheral): Secondary | ICD-10-CM

## 2020-01-04 NOTE — Progress Notes (Signed)
44y/o Caucasian female pt presenting with c/o hardened, reddened area to lateral lower R leg x4 days. 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 This does not feel similar to original, but does feel similar to an area that received ablation in March 2021.   Subjective:    Patient ID: Danielle Cooke, female    DOB: 1976-08-21, 44 y.o.   MRN: [id number]  HPI Danielle Cooke 44 yo F known to clinic  Hx superficial thrombophlebitis, s/p ablation, did well Has had brief areas of mild tenderness intermitently RLE Though she presented to day she denies any particular concern  At this time except for minimal tenderness in an area about 2 cm right lateral calf  Described as vague and minimally tender. No heat no exacerbation with walking  SHe was on Eliquis for a few months last year and ended Rx about 8 months ago. Not on ASA therapy Due to see Vascular again now. Needs to draw labs for their review    Review of Systems AS noted    5'1"   287 pounds Denies that she has been counseled to lose weight or modify salt intake - admittedly loves chips and snacks Usually wears compression hose by her report but skipped to day for the visit here Reports usually wears shoes with good arch support but today has older soft slip ons, also for the convenience of clinic visit    Objective:   Physical Exam Vitals and nursing note reviewed.  Constitutional:      Comments: morbidly obese female  HENT:     Head: Normocephalic and atraumatic.     Mouth/Throat:     Mouth: Mucous membranes are moist.  Eyes:     Extraocular Movements: Extraocular movements intact.  Cardiovascular:     Rate and Rhythm: Normal rate and regular rhythm.     Pulses: Normal pulses.  Pulmonary:     Effort: Pulmonary effort is normal.  Abdominal:     Comments: protuberant  Musculoskeletal:     Right lower leg: Edema present.     Left lower leg: Edema present.     Comments: Bilateral legs obese  with edema wearing tight stretch jeans with lower  hem creating restrictive band around each leg  Also wearing sneaker socks that have narrow elastic at each ankle creating another area of marked resrtiction Legs are large, calves in particular She reports previous surgery medial right thigh has continued touch tenderness at times- not today Surgery site right lateral ankle , aged, minimally tender to palpation Area of initial presentation is very ill defined with no redness ot heat, minimal tenderness right lateral calf and about 2-2.5 cm annular area  No heat no increased tenderness   Skin:    General: Skin is warm and dry.  Neurological:     Mental Status: She is alert.  Psychiatric:        Mood and Affect: Mood normal.       Assessment & Plan:  Discussed edema , restrictive bands, need for compression hose, arch support and walking activity with supportive shoes - 30 minutes per day  Strong;y recommend she seriously address weight loss and is encourgaged to print DASH diet at home for review and Lo Sodium diet lists. Bottles and cans, fast foods, empty calories - increased awareness and avoidance  She reports she is due for vascular follow up and is encouraged to call this week to report these transient tender areas - for their recommendations and possible exam. Also to  schedule routine and get lab orders for draw at workplace. Discussed need for her to be proactive and report to Vascular should episodes/areas  develop so they can be examined when present. rtc PRN

## 2020-02-19 ENCOUNTER — Other Ambulatory Visit: Payer: Self-pay | Admitting: *Deleted

## 2020-02-19 MED ORDER — TRIAMCINOLONE ACETONIDE 0.1 % EX CREA: 1.0000 "application " | TOPICAL_CREAM | Freq: Two times a day (BID) | CUTANEOUS | 0 refills | Status: DC

## 2020-03-19 ENCOUNTER — Encounter: Payer: Self-pay | Admitting: Registered Nurse

## 2020-03-19 ENCOUNTER — Telehealth: Payer: Self-pay | Admitting: Registered Nurse

## 2020-03-19 DIAGNOSIS — M79672 Pain in left foot: Secondary | ICD-10-CM

## 2020-03-19 DIAGNOSIS — R252 Cramp and spasm: Secondary | ICD-10-CM

## 2020-03-19 NOTE — Patient Instructions (Signed)
Leg Cramps Leg cramps occur when one or more muscles tighten and you have no control over this tightening (involuntary muscle contraction). Muscle cramps can develop in any muscle, but the most common place is in the calf muscles of the leg. Those cramps can occur during exercise or when you are at rest. Leg cramps are painful, and they may last for a few seconds to a few minutes. Cramps may return several times before they finally stop. Usually, leg cramps are not caused by a serious medical problem. In many cases, the cause is not known. Some common causes include:  Excessive physical effort (overexertion), such as during intense exercise.  Overuse from repetitive motions, or doing the same thing over and over.  Staying in a certain position for a long period of time.  Improper preparation, form, or technique while performing a sport or an activity.  Dehydration.  Injury.  Side effects of certain medicines.  Abnormally low levels of minerals in your blood (electrolytes), especially potassium and calcium. This could result from: ? Pregnancy. ? Taking diuretic medicines. Follow these instructions at home: Eating and drinking  Drink enough fluid to keep your urine pale yellow. Staying hydrated may help prevent cramps.  Eat a healthy diet that includes plenty of nutrients to help your muscles function. A healthy diet includes fruits and vegetables, lean protein, whole grains, and low-fat or nonfat dairy products. Managing pain, stiffness, and swelling      Try massaging, stretching, and relaxing the affected muscle. Do this for several minutes at a time.  If directed, put ice on areas that are sore or painful after a cramp: ? Put ice in a plastic bag. ? Place a towel between your skin and the bag. ? Leave the ice on for 20 minutes, 2-3 times a day.  If directed, apply heat to muscles that are tense or tight. Do this before you exercise, or as often as told by your health care  provider. Use the heat source that your health care provider recommends, such as a moist heat pack or a heating pad. ? Place a towel between your skin and the heat source. ? Leave the heat on for 20-30 minutes. ? Remove the heat if your skin turns bright red. This is especially important if you are unable to feel pain, heat, or cold. You may have a greater risk of getting burned.  Try taking hot showers or baths to help relax tight muscles. General instructions  If you are having frequent leg cramps, avoid intense exercise for several days.  Take over-the-counter and prescription medicines only as told by your health care provider.  Keep all follow-up visits as told by your health care provider. This is important. Contact a health care provider if:  Your leg cramps get more severe or more frequent, or they do not improve over time.  Your foot becomes cold, numb, or blue. Summary  Muscle cramps can develop in any muscle, but the most common place is in the calf muscles of the leg.  Leg cramps are painful, and they may last for a few seconds to a few minutes.  Usually, leg cramps are not caused by a serious medical problem. Often, the cause is not known.  Stay hydrated and take over-the-counter and prescription medicines only as told by your health care provider. This information is not intended to replace advice given to you by your health care provider. Make sure you discuss any questions you have with your health care  provider. Document Revised: 07/23/2017 Document Reviewed: 05/20/2017 Elsevier Patient Education  2020 [employer] Foot Arch Stretch (Plantar Fascia)    Sitting on edge of chair, place foot on top of rolling pin and gently roll foot forward and backward over rolling pin. Feel stretch in arch of foot. Repeat with other foot. Repeat _3___ times. Do __3__ sessions per day.  http://gt2.exer.us/416   Copyright  VHI. All rights reserved.  Foot Arch Stretch: Plantar  Fascia, Sitting    Sit on edge of chair. Place foot on top of roll and gently roll foot forward and backward. Feel stretch in arch of foot. Roll for __15_ seconds. Repeat _3__ times per session. Do _3__ sessions per day.  Copyright  VHI. All rights reserved.  Gastroc / Plantar Fascia, Sitting With Partner    Sit with heel in one hand of partner, other over toes. Have partner gently push toes toward trunk. To increase stretch, gently lean forward toward partner. Hold _15__ seconds. Repeat _3__ times per session. Do _3__ sessions per day.  Copyright  VHI. All rights reserved.  Gastroc / Plantar Fascia, Standing    Stand, one foot on wedge (slanted at about 30), heel resting on floor. Keep toes straight and hands on wall. With leg straight, press entire body forward. Hold _15__ seconds. Repeat _3_ times per session. Do __3_ sessions per day.  Copyright  VHI. All rights reserved.   Plantar Fasciitis Rehab Ask your health care provider which exercises are safe for you. Do exercises exactly as told by your health care provider and adjust them as directed. It is normal to feel mild stretching, pulling, tightness, or discomfort as you do these exercises. Stop right away if you feel sudden pain or your pain gets worse. Do not begin these exercises until told by your health care provider. Stretching and range-of-motion exercises These exercises warm up your muscles and joints and improve the movement and flexibility of your foot. These exercises also help to relieve pain. Plantar fascia stretch  1. Sit with your left / right leg crossed over your opposite knee. 2. Hold your heel with one hand with that thumb near your arch. With your other hand, hold your toes and gently pull them back toward the top of your foot. You should feel a stretch on the bottom of your toes or your foot (plantar fascia) or both. 3. Hold this stretch for_____15_____ seconds. 4. Slowly release your toes and return  to the starting position. Repeat ___3_______ times. Complete this exercise ____3______ times a day. Gastrocnemius stretch, standing This exercise is also called a calf (gastroc) stretch. It stretches the muscles in the back of the upper calf. 1. Stand with your hands against a wall. 2. Extend your left / right leg behind you, and bend your front knee slightly. 3. Keeping your heels on the floor and your back knee straight, shift your weight toward the wall. Do not arch your back. You should feel a gentle stretch in your upper left / right calf. 4. Hold this position for ____15______ seconds. Repeat _____3_____ times. Complete this exercise _____3_____ times a day. Soleus stretch, standing This exercise is also called a calf (soleus) stretch. It stretches the muscles in the back of the lower calf. 1. Stand with your hands against a wall. 2. Extend your left / right leg behind you, and bend your front knee slightly. 3. Keeping your heels on the floor, bend your back knee and shift your weight slightly over your back leg.  You should feel a gentle stretch deep in your lower calf. 4. Hold this position for ___15_______ seconds. Repeat _____3_____ times. Complete this exercise _____3_____ times a day. Gastroc and soleus stretch, standing step This exercise stretches the muscles in the back of the lower leg. These muscles are in the upper calf (gastrocnemius) and the lower calf (soleus). 1. Stand with the ball of your left / right foot on a step. The ball of your foot is on the walking surface, right under your toes. 2. Keep your other foot firmly on the same step. 3. Hold on to the wall or a railing for balance. 4. Slowly lift your other foot, allowing your body weight to press your left / right heel down over the edge of the step. You should feel a stretch in your left / right calf. 5. Hold this position for ___15_______ seconds. 6. Return both feet to the step. 7. Repeat this exercise with a  slight bend in your left / right knee. Repeat ____3______ times with your left / right knee straight and ___3_______ times with your left / right knee bent. Complete this exercise ___3_______ times a day. Balance exercise This exercise builds your balance and strength control of your arch to help take pressure off your plantar fascia. Single leg stand If this exercise is too easy, you can try it with your eyes closed or while standing on a pillow. 1. Without shoes, stand near a railing or in a doorway. You may hold on to the railing or door frame as needed. 2. Stand on your left / right foot. Keep your big toe down on the floor and try to keep your arch lifted. Do not let your foot roll inward. 3. Hold this position for ____15______ seconds. Repeat ____3______ times. Complete this exercise ___3_______ times a day. This information is not intended to replace advice given to you by your health care provider. Make sure you discuss any questions you have with your health care provider. Document Revised: 12/01/2018 Document Reviewed: 06/08/2018 Elsevier Patient Education  2020 [employer] Inc.  Plantar Fasciitis  Plantar fasciitis is a painful foot condition that affects the heel. It occurs when the band of tissue that connects the toes to the heel bone (plantar fascia) becomes irritated. This can happen as the result of exercising too much or doing other repetitive activities (overuse injury). The pain from plantar fasciitis can range from mild irritation to severe pain that makes it difficult to walk or move. The pain is usually worse in the morning after sleeping, or after sitting or lying down for a while. Pain may also be worse after long periods of walking or standing. What are the causes? This condition may be caused by:  Standing for long periods of time.  Wearing shoes that do not have good arch support.  Doing activities that put stress on joints (high-impact activities), including  running, aerobics, and ballet.  Being overweight.  An abnormal way of walking (gait).  Tight muscles in the back of your lower leg (calf).  High arches in your feet.  Starting a new athletic activity. What are the signs or symptoms? The main symptom of this condition is heel pain. Pain may:  Be worse with first steps after a time of rest, especially in the morning after sleeping or after you have been sitting or lying down for a while.  Be worse after long periods of standing still.  Decrease after 30-45 minutes of activity, such as gentle walking.  How is this diagnosed? This condition may be diagnosed based on your medical history and your symptoms. Your health care provider may ask questions about your activity level. Your health care provider will do a physical exam to check for:  A tender area on the bottom of your foot.  A high arch in your foot.  Pain when you move your foot.  Difficulty moving your foot. You may have imaging tests to confirm the diagnosis, such as:  X-rays.  Ultrasound.  MRI. How is this treated? Treatment for plantar fasciitis depends on how severe your condition is. Treatment may include:  Rest, ice, applying pressure (compression), and raising the affected foot (elevation). This may be called RICE therapy. Your health care provider may recommend RICE therapy along with over-the-counter pain medicines to manage your pain.  Exercises to stretch your calves and your plantar fascia.  A splint that holds your foot in a stretched, upward position while you sleep (night splint).  Physical therapy to relieve symptoms and prevent problems in the future.  Injections of steroid medicine (cortisone) to relieve pain and inflammation.  Stimulating your plantar fascia with electrical impulses (extracorporeal shock wave therapy). This is usually the last treatment option before surgery.  Surgery, if other treatments have not worked after 12  months. Follow these instructions at home:  Managing pain, stiffness, and swelling  If directed, put ice on the painful area: ? Put ice in a plastic bag, or use a frozen bottle of water. ? Place a towel between your skin and the bag or bottle. ? Roll the bottom of your foot over the bag or bottle. ? Do this for 20 minutes, 2-3 times a day.  Wear athletic shoes that have air-sole or gel-sole cushions, or try wearing soft shoe inserts that are designed for plantar fasciitis.  Raise (elevate) your foot above the level of your heart while you are sitting or lying down. Activity  Avoid activities that cause pain. Ask your health care provider what activities are safe for you.  Do physical therapy exercises and stretches as told by your health care provider.  Try activities and forms of exercise that are easier on your joints (low-impact). Examples include swimming, water aerobics, and biking. General instructions  Take over-the-counter and prescription medicines only as told by your health care provider.  Wear a night splint while sleeping, if told by your health care provider. Loosen the splint if your toes tingle, become numb, or turn cold and blue.  Maintain a healthy weight, or work with your health care provider to lose weight as needed.  Keep all follow-up visits as told by your health care provider. This is important. Contact a health care provider if you:  Have symptoms that do not go away after caring for yourself at home.  Have pain that gets worse.  Have pain that affects your ability to move or do your daily activities. Summary  Plantar fasciitis is a painful foot condition that affects the heel. It occurs when the band of tissue that connects the toes to the heel bone (plantar fascia) becomes irritated.  The main symptom of this condition is heel pain that may be worse after exercising too much or standing still for a long time.  Treatment varies, but it usually  starts with rest, ice, compression, and elevation (RICE therapy) and over-the-counter medicines to manage pain. This information is not intended to replace advice given to you by your health care provider. Make sure you discuss any  questions you have with your health care provider. Document Revised: 07/23/2017 Document Reviewed: 06/07/2017 Elsevier Patient Education  2020 [employer].

## 2020-03-19 NOTE — Telephone Encounter (Signed)
Patient seen at [facility].  Advice regarding leg cramp and foot pain.  Has tried new shoes and no relief.  Hx DVT right and wearing compression stocking right only not left.  Shoes slip on sketchers.  Works in call center/desk work.  Patient reports doesn't wear shoes in house at home.  Discussed walking barefoot can worsen plantar fasciitis as can wearing thin sandals/flip flops without arch support/cushion.   [facility] handouts on plantar fasciitis and plantar fasciitis rehab exercises printed and given to patient. Discussed achilles/gastrocnemius/foot/plantar stretches and icing 15 minutes at least nightly with frozen water bottle rolling foot over.. Follow up with PCM if no improvement with discussed care for podiatry referral. Consider new supportive footwear/OTC inserts if shoe treads worn out/greater than 23 year old.  Do not walk barefoot at home or thin leather no support sandals/flip flops/shoes as this can worsen condition/pain. Start wearing compression socks both lower extremities to include feet.  Consider getting dedicated pair of supportive shoes for in home wear.  Patient asked if ballet flats not recommended also and discussed with her I did not recommend ballet flat type slip on shoes either at this time. Consider compression socks/plantar fasciitis socks and weight loss also. Patient verbalized understanding of instructions, agreed with plan of care and had no further questions at this time.

## 2020-06-19 LAB — HM MAMMOGRAPHY

## 2020-06-21 ENCOUNTER — Other Ambulatory Visit (INDEPENDENT_AMBULATORY_CARE_PROVIDER_SITE_OTHER): Payer: Self-pay | Admitting: Family Medicine

## 2020-06-26 ENCOUNTER — Telehealth: Payer: Self-pay | Admitting: Registered Nurse

## 2020-06-26 NOTE — Telephone Encounter (Signed)
Contacted by HR patient had to leave work due to asthma attack at work and they requested I contact patient.  Patient reached via telephone stated she had to use her albuterol inhaler 3 times.  She hadn't used in a long time and she thinks no medication dispensed on first dosing so really only two uses.  Denied being sick/sick contacts.  Denied fever/chills/body aches/n/v/d/rhinitis/cough.  Not uncommon for her to have flare spring and fall.  She does not need any refills on her allergy or asthma medications.  She reported she is sensitive to temp changes/perfumes and has been triggered/had asthma attack from these type conditions.  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  Patient A&Ox3, respirations even and unlabored, spoke full sentences without difficulty, no cough, nasal congestion/clearing or throat clearing noted during telephone call.  Patient to follow up in clinic tomorrow with RN Rolly Salter or I if recurrent symptoms.  Patient reported today first day she used heater in car and that is probable source of her asthma flare. No quarantine or covid testing indicated at this time.  Cleared to RTW as long as no new symptoms overnight.   Patient verbalized understanding information/instructions, agreed with plan of care and had no further questions at this time.  HR team notified no quarantine required at this time do not suspect covid related.

## 2020-07-06 NOTE — Telephone Encounter (Signed)
Patient seen at workcenter feeling well no further concerns.

## 2020-08-13 ENCOUNTER — Other Ambulatory Visit: Payer: Self-pay | Admitting: *Deleted

## 2020-08-13 MED ORDER — TRIAMCINOLONE ACETONIDE 0.1 % EX CREA: 1.0000 "application " | TOPICAL_CREAM | Freq: Two times a day (BID) | CUTANEOUS | 0 refills | Status: DC

## 2020-10-10 ENCOUNTER — Other Ambulatory Visit: Payer: Self-pay

## 2020-10-10 ENCOUNTER — Ambulatory Visit: Payer: Self-pay | Admitting: *Deleted

## 2020-10-10 VITALS — BP 134/86 | Ht 61.0 in | Wt 276.0 lb

## 2020-10-10 DIAGNOSIS — Z0000 Encounter for general adult medical examination without abnormal findings: Secondary | ICD-10-CM

## 2020-10-10 NOTE — Progress Notes (Signed)
[employer] insurance premium discount evaluation: Labs Drawn. Replacements ROI form signed. Tobacco Free Attestation form signed.  Forms placed in paper chart.  

## 2020-10-11 LAB — CMP12+LP+TP+TSH+6AC+CBC/D/PLT
ALT: 22 IU/L (ref 0–32)
AST: 14 IU/L (ref 0–40)
Albumin/Globulin Ratio: 1.4 (ref 1.2–2.2)
Albumin: 4.1 g/dL (ref 3.8–4.8)
Alkaline Phosphatase: 55 IU/L (ref 44–121)
BUN/Creatinine Ratio: 13 (ref 9–23)
BUN: 9 mg/dL (ref 6–24)
Basophils Absolute: 0.1 10*3/uL (ref 0.0–0.2)
Basos: 1 %
Bilirubin Total: 0.3 mg/dL (ref 0.0–1.2)
Calcium: 8.7 mg/dL (ref 8.7–10.2)
Chloride: 100 mmol/L (ref 96–106)
Chol/HDL Ratio: 4 ratio (ref 0.0–4.4)
Cholesterol, Total: 170 mg/dL (ref 100–199)
Creatinine, Ser: 0.72 mg/dL (ref 0.57–1.00)
EOS (ABSOLUTE): 0.2 10*3/uL (ref 0.0–0.4)
Eos: 2 %
Estimated CHD Risk: 0.9 times avg. (ref 0.0–1.0)
Free Thyroxine Index: 1.6 (ref 1.2–4.9)
GFR calc Af Amer: 115 mL/min/{1.73_m2} (ref >59)
GFR calc non Af Amer: 100 mL/min/{1.73_m2} (ref >59)
GGT: 17 IU/L (ref 0–60)
Globulin, Total: 2.9 g/dL (ref 1.5–4.5)
Glucose: 159 mg/dL — ABNORMAL HIGH (ref 65–99)
HDL: 42 mg/dL (ref >39)
Hematocrit: 42.7 % (ref 34.0–46.6)
Hemoglobin: 13.9 g/dL (ref 11.1–15.9)
Immature Grans (Abs): 0 10*3/uL (ref 0.0–0.1)
Immature Granulocytes: 0 %
Iron: 52 ug/dL (ref 27–159)
LDH: 157 IU/L (ref 119–226)
LDL Chol Calc (NIH): 114 mg/dL — ABNORMAL HIGH (ref 0–99)
Lymphocytes Absolute: 1.8 10*3/uL (ref 0.7–3.1)
Lymphs: 27 %
MCH: 28.5 pg (ref 26.6–33.0)
MCHC: 32.6 g/dL (ref 31.5–35.7)
MCV: 88 fL (ref 79–97)
Monocytes Absolute: 0.6 10*3/uL (ref 0.1–0.9)
Monocytes: 8 %
Neutrophils Absolute: 4 10*3/uL (ref 1.4–7.0)
Neutrophils: 62 %
Phosphorus: 2.8 mg/dL — ABNORMAL LOW (ref 3.0–4.3)
Platelets: 225 10*3/uL (ref 150–450)
Potassium: 4.6 mmol/L (ref 3.5–5.2)
RBC: 4.88 x10E6/uL (ref 3.77–5.28)
RDW: 12.8 % (ref 11.7–15.4)
Sodium: 137 mmol/L (ref 134–144)
T3 Uptake Ratio: 25 % (ref 24–39)
T4, Total: 6.5 ug/dL (ref 4.5–12.0)
TSH: 3.02 u[IU]/mL (ref 0.450–4.500)
Total Protein: 7 g/dL (ref 6.0–8.5)
Triglycerides: 75 mg/dL (ref 0–149)
Uric Acid: 4.7 mg/dL (ref 2.6–6.2)
VLDL Cholesterol Cal: 14 mg/dL (ref 5–40)
WBC: 6.5 10*3/uL (ref 3.4–10.8)

## 2020-10-11 LAB — HGB A1C W/O EAG: Hgb A1c MFr Bld: 7.6 % — ABNORMAL HIGH (ref 4.8–5.6)

## 2020-10-14 NOTE — Progress Notes (Signed)
Noted I will follow up with patient tomorrow.  Reviewed RN Rolly Salter note

## 2020-10-14 NOTE — Progress Notes (Signed)
Spoke with pt by phone to set up NP appt. She reports that she does not want to start Metformin as her mother is diabetic and has been on Metformin for 40 years. Pt sts she hasn't cared about her diet for the past year and wants to work on diet and exercise for 6 months to control A1c before starting meds. Advised pt I would set tentative appt with NP in clinic for tomorrow to discuss. Pt reminded to set up MyChart so handouts could be sent to her for DM2 general, DM2 diet/plate method/carb counting, glucose testing. Pt agreeable. No further questions.

## 2020-10-15 ENCOUNTER — Ambulatory Visit: Payer: Self-pay | Admitting: Registered Nurse

## 2020-10-15 ENCOUNTER — Encounter: Payer: Self-pay | Admitting: Registered Nurse

## 2020-10-15 ENCOUNTER — Other Ambulatory Visit: Payer: Self-pay

## 2020-10-15 DIAGNOSIS — E559 Vitamin D deficiency, unspecified: Secondary | ICD-10-CM

## 2020-10-15 DIAGNOSIS — E1165 Type 2 diabetes mellitus with hyperglycemia: Secondary | ICD-10-CM

## 2020-10-15 DIAGNOSIS — E785 Hyperlipidemia, unspecified: Secondary | ICD-10-CM

## 2020-10-15 NOTE — Patient Instructions (Addendum)
Diabetes Mellitus Basics  Diabetes mellitus, or diabetes, is a long-term (chronic) disease. It occurs when the body does not properly use sugar (glucose) that is released from food after you eat. Diabetes mellitus may be caused by one or both of these problems:  Your pancreas does not make enough of a hormone called insulin.  Your body does not react in a normal way to the insulin that it makes. Insulin lets glucose enter cells in your body. This gives you energy. If you have diabetes, glucose cannot get into cells. This causes high blood glucose (hyperglycemia). How to treat and manage diabetes You may need to take insulin or other diabetes medicines daily to keep your glucose in balance. If you are prescribed insulin, you will learn how to give yourself insulin by injection. You may need to adjust the amount of insulin you take based on the foods that you eat. You will need to check your blood glucose levels using a glucose monitor as told by your health care provider. The readings can help determine if you have low or high blood glucose. Generally, you should have these blood glucose levels:  Before meals (preprandial): 80-130 mg/dL (4.4-7.2 mmol/L).  After meals (postprandial): below 180 mg/dL (10 mmol/L).  Hemoglobin A1c (HbA1c) level: less than 7%. Your health care provider will set treatment goals for you. Keep all follow-up visits. This is important. Follow these instructions at home: Diabetes medicines Take your diabetes medicines every day as told by your health care provider. List your diabetes medicines here:  Name of medicine: ______________________________ ? Amount (dose): _______________ Time (a.m./p.m.): _______________ Notes: ___________________________________  Name of medicine: ______________________________ ? Amount (dose): _______________ Time (a.m./p.m.): _______________ Notes: ___________________________________  Name of medicine:  ______________________________ ? Amount (dose): _______________ Time (a.m./p.m.): _______________ Notes: ___________________________________ Insulin If you use insulin, list the types of insulin you use here:  Insulin type: ______________________________ ? Amount (dose): _______________ Time (a.m./p.m.): _______________Notes: ___________________________________  Insulin type: ______________________________ ? Amount (dose): _______________ Time (a.m./p.m.): _______________ Notes: ___________________________________  Insulin type: ______________________________ ? Amount (dose): _______________ Time (a.m./p.m.): _______________ Notes: ___________________________________  Insulin type: ______________________________ ? Amount (dose): _______________ Time (a.m./p.m.): _______________ Notes: ___________________________________  Insulin type: ______________________________ ? Amount (dose): _______________ Time (a.m./p.m.): _______________ Notes: ___________________________________ Managing blood glucose Check your blood glucose levels using a glucose monitor as told by your health care provider. Write down the times that you check your glucose levels here:  Time: _______________ Notes: ___________________________________  Time: _______________ Notes: ___________________________________  Time: _______________ Notes: ___________________________________  Time: _______________ Notes: ___________________________________  Time: _______________ Notes: ___________________________________  Time: _______________ Notes: ___________________________________   Low blood glucose Low blood glucose (hypoglycemia) is when glucose is at or below 70 mg/dL (3.9 mmol/L). Symptoms may include:  Feeling: ? Hungry. ? Sweaty and clammy. ? Irritable or easily upset. ? Dizzy. ? Sleepy.  Having: ? A fast heartbeat. ? A headache. ? A change in your vision. ? Numbness around the mouth, lips, or  tongue.  Having trouble with: ? Moving (coordination). ? Sleeping. Treating low blood glucose To treat low blood glucose, eat or drink something containing sugar right away. If you can think clearly and swallow safely, follow the 15:15 rule:  Take 15 grams of a fast-acting carb (carbohydrate), as told by your health care provider.  Some fast-acting carbs are: ? Glucose tablets: take 3-4 tablets. ? Hard candy: eat 3-5 pieces. ? Fruit juice: drink 4 oz (120 mL). ? Regular (not diet) soda: drink 4-6 oz (120-180 mL). ? Honey or sugar:  eat 1 Tbsp (15 mL).  Check your blood glucose levels 15 minutes after you take the carb.  If your glucose is still at or below 70 mg/dL (3.9 mmol/L), take 15 grams of a carb again.  If your glucose does not go above 70 mg/dL (3.9 mmol/L) after 3 tries, get help right away.  After your glucose goes back to normal, eat a meal or a snack within 1 hour. Treating very low blood glucose If your glucose is at or below 54 mg/dL (3 mmol/L), you have very low blood glucose (severe hypoglycemia). This is an emergency. Do not wait to see if the symptoms will go away. Get medical help right away. Call your local emergency services (911 in the [place] Do not drive yourself to the hospital. Questions to ask your health care provider  Should I talk with a diabetes educator?  What equipment will I need to care for myself at home?  What diabetes medicines do I need? When should I take them?  How often do I need to check my blood glucose levels?  What number can I call if I have questions?  When is my follow-up visit?  Where can I find a support group for people with diabetes? Where to find more information  American Diabetes Association: www.diabetes.org  Association of Diabetes Care and Education Specialists: www.diabeteseducator.org Contact a health care provider if:  Your blood glucose is at or above 240 mg/dL (13.3 mmol/L) for 2 days in a row.  You have  been sick or have had a fever for 2 days or more, and you are not getting better.  You have any of these problems for more than 6 hours: ? You cannot eat or drink. ? You feel nauseous. ? You vomit. ? You have diarrhea. Get help right away if:  Your blood glucose is lower than 54 mg/dL (3 mmol/L).  You get confused.  You have trouble thinking clearly.  You have trouble breathing. These symptoms may represent a serious problem that is an emergency. Do not wait to see if the symptoms will go away. Get medical help right away. Call your local emergency services (911 in the [place] Do not drive yourself to the hospital. Summary  Diabetes mellitus is a chronic disease that occurs when the body does not properly use sugar (glucose) that is released from food after you eat.  Take insulin and diabetes medicines as told.  Check your blood glucose every day, as often as told.  Keep all follow-up visits. This is important. This information is not intended to replace advice given to you by your health care provider. Make sure you discuss any questions you have with your health care provider. Document Revised: 12/12/2019 Document Reviewed: 12/12/2019 Elsevier Patient Education  2021 [place].  Type 2 Diabetes Mellitus, Self-Care, Adult Caring for yourself after you have been diagnosed with type 2 diabetes (type 2 diabetes mellitus) means keeping your blood sugar (glucose) under control with a balance of:  Nutrition.  Exercise.  Lifestyle changes.  Medicines or insulin, if needed.  Support from your team of health care providers and others. The following information explains what you need to know to manage your diabetes at home. What are the risks? Having diabetes can put you at risk for other long-term (chronic) conditions, such as heart disease and kidney disease. Your health care provider may prescribe medicines to help prevent complications from diabetes. How to monitor blood  glucose  Check your blood glucose every day,  as often as told by your health care provider.  Have your A1C (hemoglobin A1C) level checked two or more times a year, or as often as told by your health care provider.  Your health care provider will set personalized treatment goals for you. Generally, the goal of treatment is to maintain the following blood glucose levels: ? Before meals: 80-130 mg/dL (0.3-5.0 mmol/L). ? After meals: below 180 mg/dL (10 mmol/L). ? A1C level: less than 7%.   How to manage hyperglycemia and hypoglycemia Hyperglycemia symptoms Hyperglycemia, also called high blood glucose, occurs when blood glucose is too high. Make sure you know the early signs of hyperglycemia, such as:  Increased thirst.  Hunger.  Feeling very tired.  Needing to urinate more often than usual.  Blurry vision. Hypoglycemia symptoms Hypoglycemia, also called low blood glucose, occurs with a blood glucose level at or below 70 mg/dL (3.9 mmol/L). Diabetes medicines lower your blood glucose and can cause hypoglycemia. The risk for hypoglycemia increases during or after exercise, during sleep, during illness, and when skipping meals or not eating for a long time (fasting). It is important to know the symptoms of hypoglycemia and treat it right away. Always have a 15-gram rapid-acting carbohydrate snack with you to treat low blood glucose. Family members and close friends should also know the symptoms and understand how to treat hypoglycemia, in case you are not able to treat yourself. Symptoms may include:  Hunger.  Anxiety.  Sweating and feeling clammy.  Dizziness or feeling light-headed.  Sleepiness.  Increased heart rate.  Irritability.  Tingling or numbness around the mouth, lips, or tongue.  Restless sleep. Severe hypoglycemia is when your blood glucose level is at or below 54 mg/dL (3 mmol/L). Severe hypoglycemia is an emergency. Do not wait to see if the symptoms will go away.  Get medical help right away. Call your local emergency services (911 in the [place] Do not drive yourself to the hospital. If you have severe hypoglycemia and you cannot eat or drink, you may need glucagon. A family member or close friend should learn how to check your blood glucose and how to give you glucagon. Ask your health care provider if you need to have an emergency glucagon kit available. Follow these instructions at home: Medicines  Take diabetes medicines as told by your health care provider. If your health care provider prescribed insulin or diabetes medicines, take them every day.  Do not run out of insulin or other diabetes medicines. Plan ahead so you always have these available.  If you use insulin, adjust your dosage based on your physical activity and what foods you eat. Your health care provider will tell you how to adjust your dosage.  Take over-the-counter and prescription medicines only as told by your health care provider. Eating and drinking What you eat and drink affects your blood glucose and your insulin dosage. Making good choices helps to control your diabetes and prevent other health problems. A healthy meal plan includes eating lean proteins, complex carbohydrates, fresh fruits and vegetables, low-fat dairy products, and healthy fats. Make an appointment to see a registered dietitian to help you create an eating plan that is right for you. Make sure that you:  Follow instructions from your health care provider about eating or drinking restrictions.  Drink enough fluid to keep your urine pale yellow.  Keep a record of the carbohydrates that you eat. Do this by reading food labels and learning the standard serving sizes of foods.  Follow  your sick-day plan whenever you cannot eat or drink as usual. Make this plan in advance with your health care provider.   Activity  Stay active. Exercise regularly, as told by your health care provider. This may  include: ? Stretching and doing strength exercises, such as yoga or weight lifting, 2 or more times a week. ? Doing 150 minutes or more of moderate-intensity or vigorous-intensity exercise each week. This could be brisk walking, biking, or water aerobics.  Spread out your activity over 3 or more days of the week.  Do not go more than 2 days in a row without doing some kind of physical activity.  When you start a new exercise or activity, work with your health care provider to adjust your insulin, medicines, or food intake as needed. Lifestyle  Do not use any products that contain nicotine or tobacco, such as cigarettes, e-cigarettes, and chewing tobacco. If you need help quitting, ask your health care provider.  If your health care provider says that alcohol is safe for you, limit how much you use to no more than 1 drink a day for women who are not pregnant and 2 drinks a day for men. In the [place] one drink equals one 12 oz bottle of beer (355 mL), one 5 oz glass of wine (148 mL), or one 1 oz glass of hard liquor (44 mL).  Learn to manage stress. If you need help with this, ask your health care provider. Take care of your body  Keep your immunizations up to date. In addition to getting vaccinations as told by your health care provider, it is recommended that you get vaccinated against the following illnesses: ? The flu (influenza). Get a flu shot every year. ? Pneumonia. ? Hepatitis B.  Schedule an eye exam soon after your diagnosis, and then one time every year after that.  Check your skin and feet every day for cuts, bruises, redness, blisters, or sores. Schedule a foot exam with your health care provider once every year.  Brush your teeth and gums two times a day, and floss one or more times a day. Visit your dentist one or more times every 6 months.  Maintain a healthy weight.   General instructions  Share your diabetes management plan with people in your workplace, school, and  household.  Carry a medical alert card or wear medical alert jewelry.  Keep all follow-up visits as told by your health care provider. This is important. Questions to ask your health care provider  Should I meet with a certified diabetes care and education specialist?  Where can I find a support group for people with diabetes? Where to find more information  American Diabetes Association (ADA): www.diabetes.org  American Association of Diabetes Care and Education Specialists (ADCES): www.diabeteseducator.org  International Diabetes Federation (IDF): DCOnly.dk Summary  Caring for yourself after you have been diagnosed with type 2 diabetes (type 2 diabetes mellitus) means keeping your blood sugar (glucose) under control with a balance of nutrition, exercise, lifestyle changes, and medicine.  Check your blood glucose every day, as often as told by your health care provider.  Having diabetes can put you at risk for other long-term (chronic) conditions, such as heart disease and kidney disease. Your health care provider may prescribe medicines to help prevent complications from diabetes.  Share your diabetes management plan with people in your workplace, school, and household.  Keep all follow-up visits as told by your health care provider. This is important. This information is  not intended to replace advice given to you by your health care provider. Make sure you discuss any questions you have with your health care provider. Document Revised: 09/18/2019 Document Reviewed: 09/19/2019 Elsevier Patient Education  2021 [place].  Diabetes Mellitus and Exercise Exercising regularly is important for overall health, especially for people who have diabetes mellitus. Exercising is not only about losing weight. It has many other health benefits, such as increasing muscle strength and bone density and reducing body fat and stress. This leads to improved fitness, flexibility, and endurance,  all of which result in better overall health. What are the benefits of exercise if I have diabetes? Exercise has many benefits for people with diabetes. They include:  Helping to lower and control blood sugar (glucose).  Helping the body to respond better to the hormone insulin by improving insulin sensitivity.  Reducing how much insulin the body needs.  Lowering the risk for heart disease by: ? Lowering "bad" cholesterol and triglyceride levels. ? Increasing "good" cholesterol levels. ? Lowering blood pressure. ? Lowering blood glucose levels. What is my activity plan? Your health care provider or certified diabetes educator can help you make a plan for the type and frequency of exercise that works for you. This is called your activity plan. Be sure to:  Get at least 150 minutes of medium-intensity or high-intensity exercise each week. Exercises may include brisk walking, biking, or water aerobics.  Do stretching and strengthening exercises, such as yoga or weight lifting, at least 2 times a week.  Spread out your activity over at least 3 days of the week.  Get some form of physical activity each day. ? Do not go more than 2 days in a row without some kind of physical activity. ? Avoid being inactive for more than 90 minutes at a time. Take frequent breaks to walk or stretch.  Choose exercises or activities that you enjoy. Set realistic goals.  Start slowly and gradually increase your exercise intensity over time.   How do I manage my diabetes during exercise? Monitor your blood glucose  Check your blood glucose before and after exercising. If your blood glucose is: ? 240 mg/dL (13.3 mmol/L) or higher before you exercise, check your urine for ketones. These are chemicals created by the liver. If you have ketones in your urine, do not exercise until your blood glucose returns to normal. ? 100 mg/dL (5.6 mmol/L) or lower, eat a snack containing 15-20 grams of carbohydrate. Check  your blood glucose 15 minutes after the snack to make sure that your glucose level is above 100 mg/dL (5.6 mmol/L) before you start your exercise.  Know the symptoms of low blood glucose (hypoglycemia) and how to treat it. Your risk for hypoglycemia increases during and after exercise. Follow these tips and your health care provider's instructions  Keep a carbohydrate snack that is fast-acting for use before, during, and after exercise to help prevent or treat hypoglycemia.  Avoid injecting insulin into areas of the body that are going to be exercised. For example, avoid injecting insulin into: ? Your arms, when you are about to play tennis. ? Your legs, when you are about to go jogging.  Keep records of your exercise habits. Doing this can help you and your health care provider adjust your diabetes management plan as needed. Write down: ? Food that you eat before and after you exercise. ? Blood glucose levels before and after you exercise. ? The type and amount of exercise you have  done.  Work with your health care provider when you start a new exercise or activity. He or she may need to: ? Make sure that the activity is safe for you. ? Adjust your insulin, other medicines, and food that you eat.  Drink plenty of water while you exercise. This prevents loss of water (dehydration) and problems caused by a lot of heat in the body (heat stroke).   Where to find more information  American Diabetes Association: www.diabetes.org Summary  Exercising regularly is important for overall health, especially for people who have diabetes mellitus.  Exercising has many health benefits. It increases muscle strength and bone density and reduces body fat and stress. It also lowers and controls blood glucose.  Your health care provider or certified diabetes educator can help you make an activity plan for the type and frequency of exercise that works for you.  Work with your health care provider to make  sure any new activity is safe for you. Also work with your health care provider to adjust your insulin, other medicines, and the food you eat. This information is not intended to replace advice given to you by your health care provider. Make sure you discuss any questions you have with your health care provider. Document Revised: 05/08/2019 Document Reviewed: 05/08/2019 Elsevier Patient Education  2021 [place].   Vitamin D Deficiency Vitamin D deficiency is when your body does not have enough vitamin D. Vitamin D is important to your body for many reasons:  It helps the body absorb two important minerals--calcium and phosphorus.  It plays a role in bone health.  It may help to prevent some diseases, such as diabetes and multiple sclerosis.  It plays a role in muscle function, including heart function. If vitamin D deficiency is severe, it can cause a condition in which your bones become soft. In adults, this condition is called osteomalacia. In children, this condition is called rickets. What are the causes? This condition may be caused by:  Not eating enough foods that contain vitamin D.  Not getting enough natural sun exposure.  Having certain digestive system diseases that make it difficult for your body to absorb vitamin D. These diseases include Crohn's disease, chronic pancreatitis, and cystic fibrosis.  Having a surgery in which a part of the stomach or a part of the small intestine is removed.  Having chronic kidney disease or liver disease. What increases the risk? You are more likely to develop this condition if you:  Are older.  Do not spend much time outdoors.  Live in a long-term care facility.  Have had broken bones.  Have weak or thin bones (osteoporosis).  Have a disease or condition that changes how the body absorbs vitamin D.  Have dark skin.  Take certain medicines, such as steroid medicines or certain seizure medicines.  Are overweight or  obese. What are the signs or symptoms? In mild cases of vitamin D deficiency, there may not be any symptoms. If the condition is severe, symptoms may include:  Bone pain.  Muscle pain.  Falling often.  Broken bones caused by a minor injury. How is this diagnosed? This condition may be diagnosed with blood tests. Imaging tests such as X-rays may also be done to look for changes in the bone. How is this treated? Treatment for this condition may depend on what caused the condition. Treatment options include:  Taking vitamin D supplements. Your health care provider will suggest what dose is best for you.  Taking a calcium supplement. Your health care provider will suggest what dose is best for you. Follow these instructions at home: Eating and drinking  Eat foods that contain vitamin D. Choices include: ? Fortified dairy products, cereals, or juices. Fortified means that vitamin D has been added to the food. Check the label on the package to see if the food is fortified. ? Fatty fish, such as salmon or trout. ? Eggs. ? Oysters. ? Mushrooms. The items listed above may not be a complete list of recommended foods and beverages. Contact a dietitian for more information.   General instructions  Take medicines and supplements only as told by your health care provider.  Get regular, safe exposure to natural sunlight.  Do not use a tanning bed.  Maintain a healthy weight. Lose weight if needed.  Keep all follow-up visits as told by your health care provider. This is important. How is this prevented? You can get vitamin D by:  Eating foods that naturally contain vitamin D.  Eating or drinking products that have been fortified with vitamin D, such as cereals, juices, and dairy products (including milk).  Taking a vitamin D supplement or a multivitamin supplement that contains vitamin D.  Being in the sun. Your body naturally makes vitamin D when your skin is exposed to sunlight.  Your body changes the sunlight into a form of the vitamin that it can use. Contact a health care provider if:  Your symptoms do not go away.  You feel nauseous or you vomit.  You have fewer bowel movements than usual or are constipated. Summary  Vitamin D deficiency is when your body does not have enough vitamin D.  Vitamin D is important to your body for good bone health and muscle function, and it may help prevent some diseases.  Vitamin D deficiency is primarily treated through supplementation. Your health care provider will suggest what dose is best for you.  You can get vitamin D by eating foods that contain vitamin D, by being in the sun, and by taking a vitamin D supplement or a multivitamin supplement that contains vitamin D. This information is not intended to replace advice given to you by your health care provider. Make sure you discuss any questions you have with your health care provider. Document Revised: 04/18/2018 Document Reviewed: 04/18/2018 Elsevier Patient Education  2021 [employer].

## 2020-10-15 NOTE — Progress Notes (Signed)
Subjective:    Patient ID: Danielle Cooke, female    DOB: April 09, 1976, 45 y.o.   MRN: [id number]  45 Year old single established caucasian female presents to review lab results related to elevated A1C and management recommendations.   Labs drawn for be well insurance discount.  Patient has noted weight gain over past year and started to cut out soda 2 weeks ago and changing her dietary habits back to weight loss program she followed a couple years ago that worked well for her.  Interested in help with weight loss.  Does not want to start metformin as her mother on metformin for many years now.  Tried to schedule ophthalmology appt but none available was told to call back in march as family history of glaucoma and she has been seeing floaters.  Thigh rash improved at this time.  Has noticed feeling thirsty and peeing frequently smaller amounts but these symptoms improved since decreasing soda intake and increasing water intake to 32 oz per day.  Planning to get OTC glucometer to monitor her blood sugars at home.     Review of Systems  Constitutional: Negative for activity change, appetite change, chills, diaphoresis, fatigue and fever.  HENT: Negative for trouble swallowing and voice change.   Eyes: Positive for visual disturbance. Negative for photophobia.  Respiratory: Negative for cough.   Gastrointestinal: Negative for diarrhea, nausea and vomiting.  Endocrine: Positive for polydipsia and polyuria. Negative for cold intolerance, heat intolerance and polyphagia.  Genitourinary: Positive for decreased urine volume and frequency. Negative for difficulty urinating, dysuria, enuresis and flank pain.  Musculoskeletal: Negative for gait problem, neck pain and neck stiffness.  Skin: Positive for rash.  Allergic/Immunologic: Positive for environmental allergies. Negative for food allergies.  Neurological: Negative for dizziness, tremors, seizures, syncope, facial asymmetry, speech difficulty,  weakness, light-headedness, numbness and headaches.  Hematological: Negative for adenopathy. Does not bruise/bleed easily.  Psychiatric/Behavioral: Negative for agitation and confusion.       Objective:   Physical Exam Nursing note reviewed.  Constitutional:      General: She is awake. She is not in acute distress.    Appearance: Normal appearance. She is well-developed and well-groomed. She is morbidly obese. She is not ill-appearing, toxic-appearing or diaphoretic.  HENT:     Head: Normocephalic and atraumatic.     Jaw: There is normal jaw occlusion.     Right Ear: Hearing and external ear normal.     Left Ear: Hearing and external ear normal.     Nose: Nose normal.     Mouth/Throat:     Pharynx: Oropharynx is clear.  Eyes:     General: Lids are normal. Vision grossly intact. Gaze aligned appropriately. No allergic shiner, visual field deficit or scleral icterus.       Right eye: No discharge.        Left eye: No discharge.     Extraocular Movements: Extraocular movements intact.     Conjunctiva/sclera: Conjunctivae normal.     Pupils: Pupils are equal, round, and reactive to light.  Neck:     Trachea: Trachea and phonation normal. No tracheal deviation.  Cardiovascular:     Rate and Rhythm: Normal rate and regular rhythm.     Pulses: Normal pulses.  Pulmonary:     Effort: Pulmonary effort is normal. No respiratory distress.     Breath sounds: Normal breath sounds and air entry. No stridor or transmitted upper airway sounds. No wheezing.     Comments: Spoke full sentences  without difficulty; no cough noted in exam room; wearing mask due to covid 19 pandemic Abdominal:     Palpations: Abdomen is soft.  Musculoskeletal:     Right shoulder: No swelling, deformity or laceration. Normal range of motion.     Left shoulder: No swelling, deformity or laceration. Normal range of motion.     Right elbow: No swelling, deformity, effusion or lacerations. Normal range of motion.      Left elbow: No swelling, deformity, effusion or lacerations. Normal range of motion.     Right hand: No swelling, deformity or lacerations. Normal range of motion.     Left hand: No swelling, deformity or lacerations. Normal range of motion.     Cervical back: Normal range of motion and neck supple. No swelling, edema, deformity, erythema, signs of trauma, lacerations, rigidity, torticollis, tenderness or crepitus. No pain with movement. Normal range of motion.     Thoracic back: No swelling, edema, deformity, signs of trauma or lacerations. Normal range of motion.     Lumbar back: No swelling, edema, deformity, signs of trauma or lacerations. Normal range of motion.     Right knee: No swelling, deformity, effusion or lacerations.     Left knee: No swelling, deformity, effusion or lacerations.     Right ankle: No swelling, deformity, ecchymosis or lacerations. Normal range of motion.     Left ankle: No swelling, deformity, ecchymosis or lacerations. Normal range of motion.  Lymphadenopathy:     Head:     Right side of head: No submental or preauricular adenopathy.     Left side of head: No submental or preauricular adenopathy.     Cervical: No cervical adenopathy.     Right cervical: No superficial cervical adenopathy.    Left cervical: No superficial cervical adenopathy.  Skin:    General: Skin is warm and dry.     Capillary Refill: Capillary refill takes less than 2 seconds.     Coloration: Skin is not ashen, cyanotic, jaundiced, mottled, pale or sallow.     Findings: No abrasion, abscess, acne, bruising, burn, ecchymosis, erythema, signs of injury, laceration, lesion, petechiae, rash or wound.     Nails: There is no clubbing.  Neurological:     General: No focal deficit present.     Mental Status: She is alert and oriented to person, place, and time. Mental status is at baseline.     GCS: GCS eye subscore is 4. GCS verbal subscore is 5. GCS motor subscore is 6.     Cranial Nerves:  Cranial nerves are intact. No cranial nerve deficit, dysarthria or facial asymmetry.     Sensory: Sensation is intact. No sensory deficit.     Motor: Motor function is intact. No weakness, tremor, atrophy, abnormal muscle tone or seizure activity.     Coordination: Coordination is intact. Coordination normal.     Gait: Gait is intact. Gait normal.     Comments: In/out of chair without difficulty; bilateral hand grasp equal 5/5; gait sure and steady in clinic  Psychiatric:        Attention and Perception: Attention and perception normal.        Mood and Affect: Mood and affect normal.        Speech: Speech normal.        Behavior: Behavior normal. Behavior is cooperative.        Thought Content: Thought content normal.        Cognition and Memory: Cognition and memory normal.  Judgment: Judgment normal.   Results for ENA, DEMARY" (MRN [id number]) as of 10/16/2020 21:00  Ref. Range 10/10/2020 09:45  Sodium Latest Ref Range: 134 - 144 mmol/L 137  Potassium Latest Ref Range: 3.5 - 5.2 mmol/L 4.6  Chloride Latest Ref Range: 96 - 106 mmol/L 100  Glucose Latest Ref Range: 65 - 99 mg/dL 151 (H)  BUN Latest Ref Range: 6 - 24 mg/dL 9  Creatinine Latest Ref Range: 0.57 - 1.00 mg/dL 7.61  Calcium Latest Ref Range: 8.7 - 10.2 mg/dL 8.7  BUN/Creatinine Ratio Latest Ref Range: 9 - 23  13  Phosphorus Latest Ref Range: 3.0 - 4.3 mg/dL 2.8 (L)  Alkaline Phosphatase Latest Ref Range: 44 - 121 IU/L 55  Albumin Latest Ref Range: 3.8 - 4.8 g/dL 4.1  Albumin/Globulin Ratio Latest Ref Range: 1.2 - 2.2  1.4  Uric Acid Latest Ref Range: 2.6 - 6.2 mg/dL 4.7  AST Latest Ref Range: 0 - 40 IU/L 14  ALT Latest Ref Range: 0 - 32 IU/L 22  Total Protein Latest Ref Range: 6.0 - 8.5 g/dL 7.0  Total Bilirubin Latest Ref Range: 0.0 - 1.2 mg/dL 0.3  GGT Latest Ref Range: 0 - 60 IU/L 17  GFR, Est Non African American Latest Ref Range: >59 mL/min/1.73 100  GFR, Est African American Latest Ref Range: >59  mL/min/1.73 115  Estimated CHD Risk Latest Ref Range: 0.0 - 1.0 times avg. 0.9  LDH Latest Ref Range: 119 - 226 IU/L 157  Total CHOL/HDL Ratio Latest Ref Range: 0.0 - 4.4 ratio 4.0  Cholesterol, Total Latest Ref Range: 100 - 199 mg/dL 607  HDL Cholesterol Latest Ref Range: >39 mg/dL 42  Triglycerides Latest Ref Range: 0 - 149 mg/dL 75  VLDL Cholesterol Cal Latest Ref Range: 5 - 40 mg/dL 14  LDL Chol Calc (NIH) Latest Ref Range: 0 - 99 mg/dL 371 (H)  Iron Latest Ref Range: 27 - 159 ug/dL 52  Vitamin D, 06-YIRSWNI Latest Ref Range: 30.0 - 100.0 ng/mL 16.2 (L)  Globulin, Total Latest Ref Range: 1.5 - 4.5 g/dL 2.9  WBC Latest Ref Range: 3.4 - 10.8 x10E3/uL 6.5  RBC Latest Ref Range: 3.77 - 5.28 x10E6/uL 4.88  Hemoglobin Latest Ref Range: 11.1 - 15.9 g/dL 62.7  HCT Latest Ref Range: 34.0 - 46.6 % 42.7  MCV Latest Ref Range: 79 - 97 fL 88  MCH Latest Ref Range: 26.6 - 33.0 pg 28.5  MCHC Latest Ref Range: 31.5 - 35.7 g/dL 03.5  RDW Latest Ref Range: 11.7 - 15.4 % 12.8  Platelets Latest Ref Range: 150 - 450 x10E3/uL 225  Neutrophils Latest Ref Range: Not Estab. % 62  Immature Granulocytes Latest Ref Range: Not Estab. % 0  NEUT# Latest Ref Range: 1.4 - 7.0 x10E3/uL 4.0  Lymphocyte # Latest Ref Range: 0.7 - 3.1 x10E3/uL 1.8  Monocytes Absolute Latest Ref Range: 0.1 - 0.9 x10E3/uL 0.6  Basophils Absolute Latest Ref Range: 0.0 - 0.2 x10E3/uL 0.1  Immature Grans (Abs) Latest Ref Range: 0.0 - 0.1 x10E3/uL 0.0  Lymphs Latest Ref Range: Not Estab. % 27  Monocytes Latest Ref Range: Not Estab. % 8  Basos Latest Ref Range: Not Estab. % 1  Eos Latest Ref Range: Not Estab. % 2  EOS (ABSOLUTE) Latest Ref Range: 0.0 - 0.4 x10E3/uL 0.2  Hemoglobin A1C Latest Ref Range: 4.8 - 5.6 % 7.6 (H)  TSH Latest Ref Range: 0.450 - 4.500 uIU/mL 3.020  Thyroxine (T4) Latest Ref Range: 4.5 - 12.0  ug/dL 6.5  Free Thyroxine Index Latest Ref Range: 1.2 - 4.9  1.6  T3 Uptake Ratio Latest Ref Range: 24 - 39 % 25    See  lab results note 10/16/20; my chart message sent to patient recommend starting vitamin d 50,000 units po weekly x 12 weeks and repeat vitamin d level in 3-6 months as level unchanged still 16 with OTC supplementation intermittent over the past year per patient.  Awaiting patient response with pharmacy location preference to send in electronic Rx.  Diagnosis changed to vitamin D deficiency in Epic.  Telephone messages left at home/work and mobile to get pharmacy preference clarified.  Patient not at work today.      Assessment & Plan:  A-uncontrolled type II diabetes with hyperglycemia, morbid obesity, history low vitamin d  P-Follow up in 3 months repeat HgbA1c.  Patient planning to schedule appt with [facility] to assist with weight loss [phone] located at [address] [place], [place].  Congratulated patient on taking steps to improve diet and readiness to test home blood sugars.  Discussed fasting blood sugar goal less than 120. Healthy normal fasting blood sugar without diabetes is less than 100.  Low blood sugar is less than 70.  Greater than 200 blood sugar after eating is considered high.  Recommended to check at different times of day e.g. some fasting, some after meals or before meal.  RN Rolly Salter can check in clinic for her especially if she is not feeling well e.g. tired, sweaty, confused, shaky, difficulty concentrating, dizzy or fatigued.  Hypoglycemia symptoms include Please eat your meals on regular schedule ensuring you have protein with each meal to keep sugar/glucose levels stable.  Symptoms of hypoglycemia (low blood sugar typically below 70) include an irregular or fast heartbeat, Fatigue, pale skin, shakiness, anxiety, sweating, hunger, irritability, and/or tingling or numbness of the lips, tongue or cheek.  As hypoglycemia worsens, signs and symptoms can include:  confusion, abnormal behavior or both, such as the inability to complete routine  tasks; visual disturbances, such as blurred vision; seizures, and/or loss of consciousness. RN Rolly Salter will have handout for you also.  Please keep a  form of emergency sugar (hard candy, gel, soda/juice equal to 15 gms) on your person at all times follow the 15/15 rule: Eat 15 grams of fast-acting carbs (3-4 glucose tablets or gels, 4 ounces of fruit juice or regular soda, or a tablespoon of honey or sugar) and wait 15 minutes.  If still symptomatic or blood sugar still reading low on meter eat another 15 grams of carbs.  Once blood sugar in normal range eat a healthy snack/meal.  Also please consider wearing diabetic medic alert bracelet. Caution with drinking alcohol as it can also lower blood sugar.  Do not drink on an empty stomach.  Before exercising ensure you have a small snack with protein and carbs. Signs of hyperglycemia (high blood sugar) can include: increased thirst, headaches, trouble concentrating, blurred vision, frequent urination, fatigue (weak, tired feeling) and weight loss.  If you are having symptoms or high blood sugar on your meter do not eat more fast acting/concentrated sugars.  If you are having symptoms with new diagnosis and have a glucometer check your blood sugar level.  Follow the response plan from your provider regarding medication administration.  Avoid dehydration as this can worsen elevated blood sugar levels as the body will try to filter blood sugar out through the kidneys/urine.  Encouraged weight loss  over the next year but emphasized healthy diet and activity daily main goals.  I recommended metformin  XR today po daily but patient refused.    History low vitamin D added to samples already at [facility] today.  HgbA1c, microalbumin in 3 months along with follow up visit.   Ensure staying hydrated drink water to keep urine pale yellow clear and voiding every 4 hours when awake.  Avoid dehydration.  Patient reported overdue for ophthalmology appt family history glaucoma  mother and she has floaters.  Last eyeglass Rx 1 year ago patient denied trouble reading.  Patient given weight loss clinic [facility] information and planning to contact them to schedule appt.   Avoid added sugars in diet/non-nutritional greater than 25gms/5 teaspoons per day. Discussed plate method 9 inch diameter plate 1/2 plate nonstarchy vegetables, 1/4 lean protein and 1/4 whole grain carbohydrates.  Emailed to patient [employer] for plate method and ADA choose your foods food lists for diabetes chapters protein, nonstarchy vegetables, fruits, low carb snack ideas, food list intro and how to read nutrition label handouts to her preferred home email [email or web address] [employer] handout cholesterol, high fiber diet, diabetes nutrition and foot care printed and given to patient.  Notify optometry provider diabetes diagnosis.  Also notify dentist of diabetes diagnosis as more frequent cleanings/services may be covered by insurance with diabetes.  Encouraged weight loss.  Apply emollient to feet each night e.g. vaseline/aquaphor/eucerin (squeeze or scoop container not pump as pump has alcohol in it and more drying).  Do visual foot inspection every day when putting on socks/shoes check for redness/sores.  Discussed high blood sugars can affect nerve function/altered sensation and may not noticed hot spots (red areas)/blisters or cuts especially between toes. Discussed high blood sugars can affect eyes/kidneys also and do not want to continue with high blood sugars as can cause organ damage.  Patient stated if her blood sugars not improving at 3 month follow up she will start metformin.   Patient verbalized understanding information/instructions, agreed with plan of care and had no further questions at this time.  Last vitamin D 16.  Start cholecalciferol 1000 international units d3 po daily again.  Patient had started and then stopped.  Requested add on vitamin D level to be well labs at  [facility] today.  Take vitamin D with fatty meal to improve absorption.  Patient verbalized understanding information/instructions, agreed with plan of care and had no further questions at this time.  LDL 114 goal less than 70.  Consider statin due to Hgba1c 7.6.  Patient wants to try therapeutic lifestyle changes and will follow up in 3 months.  Patient verbalized understanding information/instructions and had no further questions at this time.

## 2020-10-16 DIAGNOSIS — R7989 Other specified abnormal findings of blood chemistry: Secondary | ICD-10-CM | POA: Insufficient documentation

## 2020-10-16 DIAGNOSIS — E1165 Type 2 diabetes mellitus with hyperglycemia: Secondary | ICD-10-CM | POA: Insufficient documentation

## 2020-10-16 DIAGNOSIS — E785 Hyperlipidemia, unspecified: Secondary | ICD-10-CM | POA: Insufficient documentation

## 2020-10-16 LAB — SPECIMEN STATUS REPORT

## 2020-10-16 LAB — VITAMIN D 25 HYDROXY (VIT D DEFICIENCY, FRACTURES): Vit D, 25-Hydroxy: 16.2 ng/mL — ABNORMAL LOW (ref 30.0–100.0)

## 2020-10-17 MED ORDER — VITAMIN D (ERGOCALCIFEROL) 1.25 MG (50000 UNIT) PO CAPS: 50000.0000 [IU] | ORAL_CAPSULE | ORAL | 0 refills | Status: AC

## 2020-10-17 NOTE — Progress Notes (Signed)
Pharmacy confirmed. Advised pt of repeat Vit D, A1c, microalbumin level in 3-6 months. Orders in [facility] per NP Tina. Scheduled for 02/06/21 at 1030.

## 2020-10-17 NOTE — Progress Notes (Signed)
Noted patient scheduled for follow up labs and confirmed pharmacy and Rx sent electronically to her pharmacy of choice for cholecalciferol 50,000 units once a week by mouth #12 [id number]

## 2020-10-29 ENCOUNTER — Telehealth: Payer: Self-pay | Admitting: *Deleted

## 2020-10-29 MED ORDER — BLOOD GLUCOSE MONITOR KIT: PACK | 0 refills | Status: AC

## 2020-10-29 NOTE — Telephone Encounter (Signed)
Pt requests blood sugar meter and strips as she has been using her mother's. Order placed to pharmacy of choice.

## 2020-11-11 ENCOUNTER — Encounter: Payer: Self-pay | Admitting: *Deleted

## 2020-11-11 ENCOUNTER — Telehealth: Payer: Self-pay | Admitting: *Deleted

## 2020-11-11 DIAGNOSIS — E1165 Type 2 diabetes mellitus with hyperglycemia: Secondary | ICD-10-CM

## 2020-11-11 NOTE — Telephone Encounter (Signed)
Pt has tried to contact [facility] twice recently and phone just keeps ringing with no answer or menu to select from. Requests referral for them to contact her to schedule initial appt.

## 2020-11-12 NOTE — Telephone Encounter (Signed)
Feb 2022 labs routed to [facility] [employer].

## 2020-11-12 NOTE — Telephone Encounter (Signed)
Noted labs sent as requested.

## 2020-11-19 ENCOUNTER — Other Ambulatory Visit: Payer: Self-pay

## 2020-11-19 ENCOUNTER — Encounter: Payer: Self-pay | Admitting: Registered Nurse

## 2020-11-19 ENCOUNTER — Ambulatory Visit: Payer: Self-pay | Admitting: Registered Nurse

## 2020-11-19 VITALS — BP 131/90 | HR 64 | Temp 97.4°F

## 2020-11-19 DIAGNOSIS — R110 Nausea: Secondary | ICD-10-CM

## 2020-11-19 MED ORDER — ONDANSETRON 4 MG PO TBDP: 4.0000 mg | ORAL_TABLET | Freq: Three times a day (TID) | ORAL | 0 refills | Status: DC | PRN

## 2020-11-19 MED ORDER — OMEPRAZOLE 20 MG PO CPDR: 20.0000 mg | DELAYED_RELEASE_CAPSULE | Freq: Every day | ORAL | 0 refills | Status: DC

## 2020-11-19 NOTE — Progress Notes (Signed)
Subjective:    Patient ID: Danielle Cooke, female    DOB: 08-20-1976, 45 y.o.   MRN: [id number]  45y/o Caucasian established single female pt c/o nausea that started just prior to arrival. No episodes of vomiting yet but she feels like she is going to throw up. 3 episodes loose, not watery, stools this morning. Feeling hot and not well started while at work today.  Denied known sick contacts.  Patient with diabetes last fasting blood sugar at home 145 per patient.     Review of Systems  Constitutional: Positive for chills and diaphoresis. Negative for activity change, appetite change, fatigue and fever.  HENT: Negative for congestion, dental problem, drooling, ear discharge, ear pain, facial swelling, hearing loss, mouth sores, nosebleeds, postnasal drip, rhinorrhea, sinus pressure, sinus pain, sneezing, sore throat, tinnitus, trouble swallowing and voice change.   Eyes: Negative for photophobia and visual disturbance.  Respiratory: Negative for cough, shortness of breath, wheezing and stridor.   Cardiovascular: Negative for palpitations.  Gastrointestinal: Positive for diarrhea and nausea. Negative for abdominal pain, blood in stool, constipation and vomiting.  Endocrine: Negative for cold intolerance and heat intolerance.  Genitourinary: Negative for difficulty urinating.  Musculoskeletal: Negative for arthralgias, back pain, gait problem, joint swelling, myalgias, neck pain and neck stiffness.  Skin: Negative for rash.  Allergic/Immunologic: Positive for environmental allergies and immunocompromised state. Negative for food allergies.  Neurological: Negative for dizziness, tremors, seizures, syncope, facial asymmetry, speech difficulty, weakness, light-headedness, numbness and headaches.  Hematological: Negative for adenopathy. Does not bruise/bleed easily.  Psychiatric/Behavioral: Negative for agitation, confusion and sleep disturbance.       Objective:   Physical  Exam Constitutional:      General: She is awake. She is not in acute distress.    Appearance: Normal appearance. She is well-developed and well-groomed. She is obese. She is not ill-appearing, toxic-appearing or diaphoretic.  HENT:     Head: Normocephalic and atraumatic.     Jaw: There is normal jaw occlusion.     Salivary Glands: Right salivary gland is not diffusely enlarged or tender. Left salivary gland is not diffusely enlarged or tender.     Right Ear: Hearing and external ear normal.     Left Ear: Hearing and external ear normal.     Nose: Nose normal. No congestion or rhinorrhea.     Mouth/Throat:     Lips: Pink. No lesions.     Mouth: Mucous membranes are moist. No lacerations, oral lesions or angioedema.     Dentition: No dental abscesses or gum lesions.     Tongue: No lesions. Tongue does not deviate from midline.     Palate: No mass and lesions.     Pharynx: Oropharynx is clear. Uvula midline. No pharyngeal swelling, oropharyngeal exudate or posterior oropharyngeal erythema.     Comments: Bilateral allergic shiners;  Eyes:     General: Lids are normal. Vision grossly intact. Gaze aligned appropriately. Allergic shiner present. No visual field deficit or scleral icterus.       Right eye: No discharge.        Left eye: No discharge.     Extraocular Movements: Extraocular movements intact.     Conjunctiva/sclera: Conjunctivae normal.     Pupils: Pupils are equal, round, and reactive to light.  Neck:     Trachea: Trachea and phonation normal. No tracheal deviation.  Cardiovascular:     Rate and Rhythm: Normal rate and regular rhythm.     Pulses: Normal pulses.  Radial pulses are 2+ on the right side and 2+ on the left side.  Pulmonary:     Effort: Pulmonary effort is normal. No respiratory distress.     Breath sounds: Normal breath sounds and air entry. No stridor or transmitted upper airway sounds. No wheezing.     Comments: No cough observed in clinic; wearing  mask due to covid 19 pandemic; spoke full sentences without difficulty Abdominal:     Palpations: Abdomen is soft.  Musculoskeletal:        General: No deformity or signs of injury. Normal range of motion.     Right shoulder: Normal.     Left shoulder: Normal.     Right elbow: Normal.     Left elbow: Normal.     Right hand: Normal.     Left hand: Normal.     Cervical back: Normal, normal range of motion and neck supple. No swelling, edema, deformity, erythema, signs of trauma, lacerations, rigidity, torticollis or crepitus. No pain with movement. Normal range of motion.     Thoracic back: Normal.     Lumbar back: Normal.     Right hip: Normal.     Left hip: Normal.     Right knee: Normal.     Left knee: Normal.  Lymphadenopathy:     Head:     Right side of head: No submental or preauricular adenopathy.     Left side of head: No submental or preauricular adenopathy.     Cervical: No cervical adenopathy.     Right cervical: No superficial cervical adenopathy.    Left cervical: No superficial cervical adenopathy.  Skin:    General: Skin is warm and moist.     Capillary Refill: Capillary refill takes less than 2 seconds.     Coloration: Skin is not ashen, cyanotic, jaundiced, mottled, pale or sallow.     Findings: No abrasion, abscess, acne, bruising, burn, ecchymosis, erythema, signs of injury, laceration, lesion, petechiae, rash or wound.     Nails: There is no clubbing.     Comments: Facial sheen/sweating fine layer noted  Neurological:     General: No focal deficit present.     Mental Status: She is alert and oriented to person, place, and time. Mental status is at baseline.     GCS: GCS eye subscore is 4. GCS verbal subscore is 5. GCS motor subscore is 6.     Cranial Nerves: Cranial nerves are intact. No cranial nerve deficit, dysarthria or facial asymmetry.     Sensory: Sensation is intact. No sensory deficit.     Motor: Motor function is intact. No weakness.      Coordination: Coordination is intact. Coordination normal.     Gait: Gait is intact. Gait normal.     Comments: Gait sure and steady in clinic; in/out of chair without difficulty; bilateral hand grasp equal 5/5  Psychiatric:        Attention and Perception: Attention and perception normal.        Mood and Affect: Mood and affect normal.        Speech: Speech normal.        Behavior: Behavior normal. Behavior is cooperative.        Thought Content: Thought content normal.        Cognition and Memory: Cognition and memory normal.        Judgment: Judgment normal.           Assessment & Plan:  A-nausea without vomiting  P-suspect viral gastroenteritis that is circulating in community. Notified patient there have been other employees with similar symptoms in the previous month but none in her immediate workcenter/cubicle area known to [place] in clinic.  Electronic Rx to her pharmacy of choice zofran odt 4mg  po q8h prn n/v #20 RF0 if 1 is not helping may try 2 tabs 8mg  every 8 hours. I have recommended clear fluids and bland diet.  Avoid dairy/spicy, fried and large portions of meat while having nausea.  If vomiting hold po intake x 1 hour.  Then sips clear fluids like broths, ginger ale, power ade, gatorade, pedialyte may advance to soft/bland if no vomiting x 24 hours and appetite returned otherwise hydration main focus. Return to the clinic if symptoms persist or worsen; I have alerted the patient to call if high fever, dehydration, marked weakness, fainting, increased abdominal pain, blood in stool or vomit (red or black).  Patient to go to ER/Urgent Care if urine tea/cola colored brown or unable to void every 8 hours (symptoms of severe dehydration).  Exitcare handout on nausea/vomiting, foods to relieve diarrhea and viral gastroenteritis and diabetes sick day management.  Discussed avoid large amounts sugar if diarrhea and may take OTC immodium--take 1 tablet after each loose stool max 8 tabs in 24  hours OTC.  Patient has kaopectate at home and that has worked well for her in the past.  Discussed may use for diarrhea also FDA approved per manufacturer instructions.  Discussed possible side effect per epocrates black tongue coating and black stools with kaopectate.  Discussed due to aspirin in peptobismol avoid at this time. Coffee/Tea are diuretic recommended gatorade/nondairy popsicles/ginger ale/broths first line if possible or alternating tea/water.   Discussed with patient stay home until fever/chills/vomiting/diarrhea resolved x 24 hours off medicine.  No covid testing indicated at this time.  HR to be notified GI symptoms recommended to go home until resolved x 24 hours.    Patient verbalized agreement and understanding of treatment plan and had no further questions at this time.

## 2020-11-19 NOTE — Patient Instructions (Signed)
Diabetes Mellitus and Sick Day Management Blood sugar (glucose) can be difficult to control when you are sick. Common illnesses that can cause problems for people with diabetes (diabetes mellitus) include colds, fever, flu (influenza), nausea, vomiting, and diarrhea. These illnesses can cause stress and loss of body fluids (dehydration), and those issues can cause blood glucose levels to increase. Because of this, it is very important to take your insulin and diabetes medicines and eat some form of carbohydrate when you are sick. You should make a plan for days when you are sick (sick day plan) as part of your diabetes management plan. You and your health care provider should make this plan in advance. The following guidelines are intended to help you manage an illness that lasts for about 24 hours or less. Your health care provider may also give you more specific instructions. How to manage your blood glucose  Check your blood glucose every 2-4 hours, or as often as told by your health care provider.  If you use insulin, take your usual dose. If your blood glucose continues to be too high, you may need to take an additional insulin dose as told by your health care provider.  Know your sick day treatment goals. Your target blood glucose levels may be different when you are sick.  If you use oral diabetes medicine, continue to take your medicines. Have a plan with your health care provider for these medicines while you are sick.  If you use injectable hormone medicines other than insulin to control your diabetes, have a plan with your health care provider for these medicines while you are sick.   Follow these instructions at home Check your ketones  If you have type 1 diabetes, check your urine ketones every 4 hours.  If you have type 2 diabetes, check your urine ketones as often as told by your health care provider. Eating and drinking  Drink enough fluid to keep your urine pale yellow. This is  especially important if you have a fever, vomiting, or diarrhea. Those symptoms can lead to dehydration.  Follow instructions from your health care provider about beverages to avoid.  Do not drink alcohol, caffeine, or drinks that contain a lot of sugar.  You need to eat some form of carbohydrates when you are sick. Eat 45-50 grams (45-50 g) of carbohydrates every 3-4 hours until you feel better. All of the food choices below contain about 15 g of carbohydrates. Plan ahead and keep some of these foods around so you have them if you get sick. ? 4-6 oz (120-177 mL) carbonated beverage that contains sugar, such as regular (not diet) soda. You may be able to drink carbonated beverages more easily if you open the beverage and let it sit at room temperature for a few minutes before drinking. ?  of a twin frozen ice pop. ? 4 oz (120 g) regular gelatin. ? 4 oz (120 mL) fruit juice. ? 4 oz (120 g) ice cream or frozen yogurt. ? 2 oz (60 g) sherbet. ? 1 slice bread or toast. ? 6 saltine crackers. ? 5 vanilla wafers. Medicines  Take-over-the-counter and prescription medicines only as told by your health care provider.  Check medicine labels for added sugars. Some medicines may contain sugar or types of sugars that can raise your blood glucose level. Questions to ask your health care provider  Should I adjust my diabetes medicines?  How often do I need to check my blood glucose?  What supplies do  I need to manage my diabetes at home when I am sick?  What number can I call if I have questions?  What foods and drinks should I avoid? Contact a health care provider if:  You have been sick or have had a fever for 2 days or longer and you are not getting better.  Your blood glucose is at or above 240 mg/dl (16.113.3 mmol/L), even after you take an additional insulin dose.  You are unable to drink fluids without vomiting.  You have any of the following for more than 6  hours: ? Nausea. ? Vomiting. ? Diarrhea. Get help right away if:  You have difficulty breathing.  You have moderate or high ketone levels in your urine.  You have a change in how you think, feel, or act (mental status).  You develop symptoms of diabetic ketoacidosis. These include: ? Nausea. ? Vomiting. ? Excessive thirst. ? Excessive urination. ? Fruity or sweet smelling breath. ? Rapid breathing. ? Pain in the abdomen.  Your blood glucose is lower than 54mg /dl (3.0 mmol/L).  You used emergency glucagon to treat low blood glucose. These symptoms may represent a serious problem that is an emergency. Do not wait to see if the symptoms will go away. Get medical help right away. Call your local emergency services (911 in the [place] Do not drive yourself to the hospital. Summary  Blood sugar (glucose) can be difficult to control when you are sick. Common illnesses that can cause problems for people with diabetes (diabetes mellitus) include colds, fever, flu (influenza), nausea, vomiting, and diarrhea.  Illnesses can cause stress and loss of body fluids (dehydration), and those issues can cause blood glucose levels to increase.  Make a plan for days when you are sick (sick day plan) as part of your diabetes management plan. You and your health care provider should make this plan in advance.  It is very important to take your insulin and diabetes medicines and to eat some form of carbohydrate when you are sick.  Contact your health care provider if have problems managing your blood glucose levels when you are sick, or if you have been sick or had a fever for 2 days or longer and are not getting better. This information is not intended to replace advice given to you by your health care provider. Make sure you discuss any questions you have with your health care provider. Document Revised: 08/31/2019 Document Reviewed: 08/31/2019 Elsevier Patient Education  2021 [employer] Food  Choices to Help Relieve Diarrhea, Adult Diarrhea can make you feel weak and cause you to become dehydrated. It is important to choose the right foods and drinks to:  Relieve diarrhea.  Replace lost fluids and nutrients.  Prevent dehydration. What are tips for following this plan? Relieving diarrhea  Avoid foods that make your diarrhea worse. These may include: ? Foods and beverages sweetened with high-fructose corn syrup, honey, or sweeteners such as xylitol, sorbitol, and mannitol. ? Fried, greasy, or spicy foods. ? Raw fruits and vegetables.  Eat foods that are rich in probiotics. These include foods such as yogurt and fermented milk products. Probiotics can help increase healthy bacteria in your stomach and intestines (gastrointestinal tract or GI tract). This may help digestion and stop diarrhea.  If you have lactose intolerance, avoid dairy products. These may make your diarrhea worse.  Take medicine to help stop diarrhea only as told by your health care provider. Replacing nutrients  Eat bland, easy-to-digest foods in small amounts  as you are able, until your diarrhea starts to get better. These foods include bananas, applesauce, rice, toast, and crackers.  Gradually reintroduce nutrient-rich foods as tolerated or as told by your health care provider. This includes: ? Well-cooked protein foods, such as eggs, lean meats like fish or chicken without skin, and tofu. ? Peeled, seeded, and soft-cooked fruits and vegetables. ? Low-fat dairy products. ? Whole grains.  Take vitamin and mineral supplements as told by your health care provider.   Preventing dehydration  Start by sipping water or a solution to prevent dehydration (oral rehydration solution, ORS). This is a drink that helps replace fluids and minerals your body has lost. You can buy an ORS at pharmacies and retail stores.  Try to drink at least 8-10 cups (2,000-2,500 mL) of fluid each day to help replace lost fluids. If  you have urine that is pale yellow, you are getting enough fluids.  You may drink other liquids in addition to water, such as fruit juice that you have added water to (diluted fruit juice) or low-calorie sports drinks, as tolerated or as told by your health care provider.  Avoid drinks with caffeine, such as coffee, tea, or soft drinks.  Avoid alcohol.   Summary  When you have diarrhea, it is important to choose the right foods and drinks to relieve diarrhea, to replace lost fluids and nutrients, and to prevent dehydration.  Make sure you drink enough fluid to keep your urine pale yellow.  You may benefit from eating bland foods at first. Gradually reintroduce healthy, nutrient-rich foods as tolerated or as told by your health care provider.  Avoid foods that make your diarrhea worse, such as fried, greasy, or spicy foods. This information is not intended to replace advice given to you by your health care provider. Make sure you discuss any questions you have with your health care provider. Document Revised: 09/26/2019 Document Reviewed: 09/26/2019 Elsevier Patient Education  2021 [employer] Diarrhea, Adult Diarrhea is frequent loose and watery bowel movements. Diarrhea can make you feel weak and cause you to become dehydrated. Dehydration can make you tired and thirsty, cause you to have a dry mouth, and decrease how often you urinate. Diarrhea typically lasts 2-3 days. However, it can last longer if it is a sign of something more serious. It is important to treat your diarrhea as told by your health care provider. Follow these instructions at home: Eating and drinking Follow these recommendations as told by your health care provider:  Take an oral rehydration solution (ORS). This is an over-the-counter medicine that helps return your body to its normal balance of nutrients and water. It is found at pharmacies and retail stores.  Drink plenty of fluids, such as water, ice chips,  diluted fruit juice, and low-calorie sports drinks. You can drink milk also, if desired.  Avoid drinking fluids that contain a lot of sugar or caffeine, such as energy drinks, sports drinks, and soda.  Eat bland, easy-to-digest foods in small amounts as you are able. These foods include bananas, applesauce, rice, lean meats, toast, and crackers.  Avoid alcohol.  Avoid spicy or fatty foods.      Medicines  Take over-the-counter and prescription medicines only as told by your health care provider.  If you were prescribed an antibiotic medicine, take it as told by your health care provider. Do not stop using the antibiotic even if you start to feel better. General instructions  Wash your hands often using soap and water.  If soap and water are not available, use a hand sanitizer. Others in the household should wash their hands as well. Hands should be washed: ? After using the toilet or changing a diaper. ? Before preparing, cooking, or serving food. ? While caring for a sick person or while visiting someone in a hospital.  Drink enough fluid to keep your urine pale yellow.  Rest at home while you recover.  Watch your condition for any changes.  Take a warm bath to relieve any burning or pain from frequent diarrhea episodes.  Keep all follow-up visits as told by your health care provider. This is important.   Contact a health care provider if:  You have a fever.  Your diarrhea gets worse.  You have new symptoms.  You cannot keep fluids down.  You feel light-headed or dizzy.  You have a headache.  You have muscle cramps. Get help right away if:  You have chest pain.  You feel extremely weak or you faint.  You have bloody or black stools or stools that look like tar.  You have severe pain, cramping, or bloating in your abdomen.  You have trouble breathing or you are breathing very quickly.  Your heart is beating very quickly.  Your skin feels cold and  clammy.  You feel confused.  You have signs of dehydration, such as: ? Dark urine, very little urine, or no urine. ? Cracked lips. ? Dry mouth. ? Sunken eyes. ? Sleepiness. ? Weakness. Summary  Diarrhea is frequent loose and watery bowel movements. Diarrhea can make you feel weak and cause you to become dehydrated.  Drink enough fluids to keep your urine pale yellow.  Make sure that you wash your hands after using the toilet. If soap and water are not available, use hand sanitizer.  Contact a health care provider if your diarrhea gets worse or you have new symptoms.  Get help right away if you have signs of dehydration. This information is not intended to replace advice given to you by your health care provider. Make sure you discuss any questions you have with your health care provider. Document Revised: 12/27/2018 Document Reviewed: 01/14/2018 Elsevier Patient Education  2021 [employer] Viral Gastroenteritis, Adult  Viral gastroenteritis is also known as the stomach flu. This condition may affect your stomach, small intestine, and large intestine. It can cause sudden watery diarrhea, fever, and vomiting. This condition is caused by many different viruses. These viruses can be passed from person to person very easily (are contagious). Diarrhea and vomiting can make you feel weak and cause you to become dehydrated. You may not be able to keep fluids down. Dehydration can make you tired and thirsty, cause you to have a dry mouth, and decrease how often you urinate. It is important to replace the fluids that you lose from diarrhea and vomiting. What are the causes? Gastroenteritis is caused by many viruses, including rotavirus and norovirus. Norovirus is the most common cause in adults. You can get sick after being exposed to the viruses from other people. You can also get sick by:  Eating food, drinking water, or touching a surface contaminated with one of these viruses.  Sharing  utensils or other personal items with an infected person. What increases the risk? You are more likely to develop this condition if you:  Have a weak body defense system (immune system).  Live with one or more children who are younger than 80 years old.  Live in a nursing  home.  Travel on cruise ships. What are the signs or symptoms? Symptoms of this condition start suddenly 1-3 days after exposure to a virus. Symptoms may last for a few days or for as long as a week. Common symptoms include watery diarrhea and vomiting. Other symptoms include:  Fever.  Headache.  Fatigue.  Pain in the abdomen.  Chills.  Weakness.  Nausea.  Muscle aches.  Loss of appetite. How is this diagnosed? This condition is diagnosed with a medical history and physical exam. You may also have a stool test to check for viruses or other infections. How is this treated? This condition typically goes away on its own. The focus of treatment is to prevent dehydration and restore lost fluids (rehydration). This condition may be treated with:  An oral rehydration solution (ORS) to replace important salts and minerals (electrolytes) in your body. Take this if told by your health care provider. This is a drink that is sold at pharmacies and retail stores.  Medicines to help with your symptoms.  Probiotic supplements to reduce symptoms of diarrhea.  Fluids given through an IV, if dehydration is severe. Older adults and people with other diseases or a weak immune system are at higher risk for dehydration. Follow these instructions at home: Eating and drinking  Take an ORS as told by your health care provider.  Drink clear fluids in small amounts as you are able. Clear fluids include: ? Water. ? Ice chips. ? Diluted fruit juice. ? Low-calorie sports drinks.  Drink enough fluid to keep your urine pale yellow.  Eat small amounts of healthy foods every 3-4 hours as you are able. This may include whole  grains, fruits, vegetables, lean meats, and yogurt.  Avoid fluids that contain a lot of sugar or caffeine, such as energy drinks, sports drinks, and soda.  Avoid spicy or fatty foods.  Avoid alcohol.   General instructions  Wash your hands often, especially after having diarrhea or vomiting. If soap and water are not available, use hand sanitizer.  Make sure that all people in your household wash their hands well and often.  Take over-the-counter and prescription medicines only as told by your health care provider.  Rest at home while you recover.  Watch your condition for any changes.  Take a warm bath to relieve any burning or pain from frequent diarrhea episodes.  Keep all follow-up visits as told by your health care provider. This is important.   Contact a health care provider if you:  Cannot keep fluids down.  Have symptoms that get worse.  Have new symptoms.  Feel light-headed or dizzy.  Have muscle cramps. Get help right away if you:  Have chest pain.  Feel extremely weak or you faint.  See blood in your vomit.  Have vomit that looks like coffee grounds.  Have bloody or black stools or stools that look like tar.  Have a severe headache, a stiff neck, or both.  Have a rash.  Have severe pain, cramping, or bloating in your abdomen.  Have trouble breathing or you are breathing very quickly.  Have a fast heartbeat.  Have skin that feels cold and clammy.  Feel confused.  Have pain when you urinate.  Have signs of dehydration, such as: ? Dark urine, very little urine, or no urine. ? Cracked lips. ? Dry mouth. ? Sunken eyes. ? Sleepiness. ? Weakness. Summary  Viral gastroenteritis is also known as the stomach flu. It can cause sudden watery diarrhea, fever,  and vomiting.  This condition can be passed from person to person very easily (is contagious).  Take an ORS if told by your health care provider. This is a drink that is sold at pharmacies  and retail stores.  Wash your hands often, especially after having diarrhea or vomiting. If soap and water are not available, use hand sanitizer. This information is not intended to replace advice given to you by your health care provider. Make sure you discuss any questions you have with your health care provider. Document Revised: 01/27/2019 Document Reviewed: 06/15/2018 Elsevier Patient Education  2021 [employer]. Viral Gastroenteritis, Adult  Viral gastroenteritis is also known as the stomach flu. This condition may affect your stomach, small intestine, and large intestine. It can cause sudden watery diarrhea, fever, and vomiting. This condition is caused by many different viruses. These viruses can be passed from person to person very easily (are contagious). Diarrhea and vomiting can make you feel weak and cause you to become dehydrated. You may not be able to keep fluids down. Dehydration can make you tired and thirsty, cause you to have a dry mouth, and decrease how often you urinate. It is important to replace the fluids that you lose from diarrhea and vomiting. What are the causes? Gastroenteritis is caused by many viruses, including rotavirus and norovirus. Norovirus is the most common cause in adults. You can get sick after being exposed to the viruses from other people. You can also get sick by:  Eating food, drinking water, or touching a surface contaminated with one of these viruses.  Sharing utensils or other personal items with an infected person. What increases the risk? You are more likely to develop this condition if you:  Have a weak body defense system (immune system).  Live with one or more children who are younger than 32 years old.  Live in a nursing home.  Travel on cruise ships. What are the signs or symptoms? Symptoms of this condition start suddenly 1-3 days after exposure to a virus. Symptoms may last for a few days or for as long as a week. Common symptoms  include watery diarrhea and vomiting. Other symptoms include:  Fever.  Headache.  Fatigue.  Pain in the abdomen.  Chills.  Weakness.  Nausea.  Muscle aches.  Loss of appetite. How is this diagnosed? This condition is diagnosed with a medical history and physical exam. You may also have a stool test to check for viruses or other infections. How is this treated? This condition typically goes away on its own. The focus of treatment is to prevent dehydration and restore lost fluids (rehydration). This condition may be treated with:  An oral rehydration solution (ORS) to replace important salts and minerals (electrolytes) in your body. Take this if told by your health care provider. This is a drink that is sold at pharmacies and retail stores.  Medicines to help with your symptoms.  Probiotic supplements to reduce symptoms of diarrhea.  Fluids given through an IV, if dehydration is severe. Older adults and people with other diseases or a weak immune system are at higher risk for dehydration. Follow these instructions at home: Eating and drinking  Take an ORS as told by your health care provider.  Drink clear fluids in small amounts as you are able. Clear fluids include: ? Water. ? Ice chips. ? Diluted fruit juice. ? Low-calorie sports drinks.  Drink enough fluid to keep your urine pale yellow.  Eat small amounts of healthy foods  every 3-4 hours as you are able. This may include whole grains, fruits, vegetables, lean meats, and yogurt.  Avoid fluids that contain a lot of sugar or caffeine, such as energy drinks, sports drinks, and soda.  Avoid spicy or fatty foods.  Avoid alcohol.   General instructions  Wash your hands often, especially after having diarrhea or vomiting. If soap and water are not available, use hand sanitizer.  Make sure that all people in your household wash their hands well and often.  Take over-the-counter and prescription medicines only as  told by your health care provider.  Rest at home while you recover.  Watch your condition for any changes.  Take a warm bath to relieve any burning or pain from frequent diarrhea episodes.  Keep all follow-up visits as told by your health care provider. This is important.   Contact a health care provider if you:  Cannot keep fluids down.  Have symptoms that get worse.  Have new symptoms.  Feel light-headed or dizzy.  Have muscle cramps. Get help right away if you:  Have chest pain.  Feel extremely weak or you faint.  See blood in your vomit.  Have vomit that looks like coffee grounds.  Have bloody or black stools or stools that look like tar.  Have a severe headache, a stiff neck, or both.  Have a rash.  Have severe pain, cramping, or bloating in your abdomen.  Have trouble breathing or you are breathing very quickly.  Have a fast heartbeat.  Have skin that feels cold and clammy.  Feel confused.  Have pain when you urinate.  Have signs of dehydration, such as: ? Dark urine, very little urine, or no urine. ? Cracked lips. ? Dry mouth. ? Sunken eyes. ? Sleepiness. ? Weakness. Summary  Viral gastroenteritis is also known as the stomach flu. It can cause sudden watery diarrhea, fever, and vomiting.  This condition can be passed from person to person very easily (is contagious).  Take an ORS if told by your health care provider. This is a drink that is sold at pharmacies and retail stores.  Wash your hands often, especially after having diarrhea or vomiting. If soap and water are not available, use hand sanitizer. This information is not intended to replace advice given to you by your health care provider. Make sure you discuss any questions you have with your health care provider. Document Revised: 01/27/2019 Document Reviewed: 06/15/2018 Elsevier Patient Education  2021 [employer].

## 2020-12-05 ENCOUNTER — Encounter: Payer: Self-pay | Admitting: Registered Nurse

## 2020-12-05 ENCOUNTER — Telehealth: Payer: Self-pay | Admitting: Registered Nurse

## 2020-12-05 DIAGNOSIS — R12 Heartburn: Secondary | ICD-10-CM

## 2020-12-05 DIAGNOSIS — E1165 Type 2 diabetes mellitus with hyperglycemia: Secondary | ICD-10-CM

## 2020-12-05 MED ORDER — MICROLET LANCETS MISC: 1.0000 [IU] | Freq: Every day | 0 refills | Status: AC | PRN

## 2020-12-05 MED ORDER — CONTOUR NEXT TEST VI STRP: ORAL_STRIP | 0 refills | Status: AC

## 2020-12-05 MED ORDER — OMEPRAZOLE 20 MG PO CPDR: 20.0000 mg | DELAYED_RELEASE_CAPSULE | Freq: Every day | ORAL | 0 refills | Status: DC

## 2020-12-05 NOTE — Telephone Encounter (Signed)
Received faxes from [facility] patient needs refill on contour next strips and lancets for blood glucose meter and omeprazole DR 20mg  po daily from [employer] formulary.  Patient last filled omeprazole 12/19/2019 not taking every day and working well for her.  Discussed with patient will send electronic Rx to [facility] for 90 day supply strips and lancets and to pick up omeprazole here in clinic.  Labs Feb 2022 magnesium normal 2.0 and Hgab1c 7.6.  Patient has appt with [facility] 20 Dec 2020 for diabetic education and weight loss.  Patient verbalized understanding information/instructions, agreed with plan of care and had no further questions at this time.

## 2020-12-17 ENCOUNTER — Telehealth: Payer: Self-pay | Admitting: Registered Nurse

## 2020-12-17 DIAGNOSIS — E1165 Type 2 diabetes mellitus with hyperglycemia: Secondary | ICD-10-CM

## 2020-12-17 NOTE — Telephone Encounter (Signed)
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  Reported home fingersticks running 112-137.  Highest after eating 187.  Has been trying to keep food log.  Cut out soda and drinking more water throughout the day.  Potatoes have made her blood sugar the highest so far.  Dietitian appt scheduled and she has list of questions to discuss with provider.  Rash started again last Friday on leg.  Patient has noticed when weather hot rash seems to appear.  Using prescribed cream topically.  Patient no further questions or concerns at this time.  Encouraged continue monitoring of blood sugars, water intake and exercise (walking) if blood sugar elevated.

## 2020-12-20 ENCOUNTER — Other Ambulatory Visit: Payer: Self-pay

## 2020-12-20 ENCOUNTER — Encounter: Payer: Self-pay | Admitting: Registered"

## 2020-12-20 ENCOUNTER — Encounter: Payer: No Typology Code available for payment source | Attending: Registered Nurse | Admitting: Registered"

## 2020-12-20 DIAGNOSIS — E1165 Type 2 diabetes mellitus with hyperglycemia: Secondary | ICD-10-CM | POA: Insufficient documentation

## 2020-12-20 NOTE — Progress Notes (Signed)
Diabetes Self-Management Education  Visit Type: First/Initial  Appt. Start Time: 0940 Appt. End Time: 1100  12/20/2020  [name], identified by name and date of birth, is a 45 y.o. female with a diagnosis of Diabetes: Type 2.   ASSESSMENT  There were no vitals taken for this visit. There is no height or weight on file to calculate BMI.   09/2020 A1c 7.6% Medications: none - patient states she wants to avoid taking medication SMBG: from patient stated values, the next A1c will likely be below 7%  Pt states she would like to know what she should and shouldn't eat. Pt states she also wants to understand why her fasting blood sugar is higher than after she eat. Patient states she likes detailed information so provided more complete handouts.  Pt states her mother has diabetes and has been doing some internet searching on how to eat. Pt reports she knows the plate method a little and has made changes including stopped eating fries and regular coke lost about 16 lbs. Pt states she feels better with changes and also since starting her high dose vitamin D.   Physical activity: Pt states she hasn't increased her structured activity, but is more active at work. Pt states she walks dog 8-10 min in the morning and just started using a cubie cycle while watching TV about 20 min.  Pt states her acid reflux symptoms have increased. Pt states she takes 20 mg omeprazole daily and when she misses a dose can feel it.    Diabetes Self-Management Education - 12/20/20 0944      Visit Information   Visit Type First/Initial      Initial Visit   Diabetes Type Type 2    Are you currently following a meal plan? No    Are you taking your medications as prescribed? Not on Medications    Date Diagnosed Feb 2022      Health Coping   How would you rate your overall health? Good      Psychosocial Assessment   Patient Belief/Attitude about Diabetes Motivated to manage diabetes    How often do you  need to have someone help you when you read instructions, pamphlets, or other written materials from your doctor or pharmacy? 1 - Never    What is the last grade level you completed in school? 1 yr college      Complications   Last HgB A1C per patient/outside source 7.6 %    How often do you check your blood sugar? 3-4 times / week    Fasting Blood glucose range (mg/dL) 109-323   557D   Postprandial Blood glucose range (mg/dL) 22-025;427-062   376-283   Number of hypoglycemic episodes per month 0    Number of hyperglycemic episodes per week 0    Have you had a dilated eye exam in the past 12 months? No    Have you had a dental exam in the past 12 months? No    Are you checking your feet? Yes    How many days per week are you checking your feet? 4      Dietary Intake   Breakfast 1 egg, 1 breakfast meat, coffee, black, maybe fruit    Snack (morning) 10:30 cucumbers OR nuts    Lunch low sodium tuna fish or chicken, salad or 5 ritz crackers    Snack (afternoon) none OR cucumbers, tomatoes OR roasted chick peas    Dinner 3 shrimp, salad, 1/2 sweet potato  OR meat, green vegetable, salad or corn or carrots    Snack (evening) none OR cheezits (~27)    Beverage(s) coffee, 32 oz water      Exercise   Exercise Type ADL's    How many days per week to you exercise? 0    How many minutes per day do you exercise? 0    Total minutes per week of exercise 0      Patient Education   Previous Diabetes Education No    Nutrition management  Role of diet in the treatment of diabetes and the relationship between the three main macronutrients and blood glucose level;Food label reading, portion sizes and measuring food.;Carbohydrate counting    Physical activity and exercise  Role of exercise on diabetes management, blood pressure control and cardiac health.    Monitoring Identified appropriate SMBG and/or A1C goals.    Chronic complications Relationship between chronic complications and blood glucose  control    Psychosocial adjustment Role of stress on diabetes      Individualized Goals (developed by patient)   Nutrition General guidelines for healthy choices and portions discussed    Physical Activity Exercise 5-7 days per week      Outcomes   Expected Outcomes Demonstrated interest in learning. Expect positive outcomes    Future DMSE 2 months    Program Status Completed           Individualized Plan for Diabetes Self-Management Training:   Learning Objective:  Patient will have a greater understanding of diabetes self-management. Patient education plan is to attend individual and/or group sessions per assessed needs and concerns.   Patient Instructions  Continue with the great changes you have made to improve your diet!  Increase your exercise by walking your dog 2x/day instead of just once.  Aim to eat balanced meals and snacks Vary your breakfast so you are not eating eggs every morning Use the Carb/meal Planning book for detailed lists of foods and carbs they contain. Use your blood sugar data to decide how much carbohydrates work for you. Consider writing your blood sugar in a log to make it easier for your health care providers to evaluate your blood sugar control. Consider the tips for better sleep   Expected Outcomes:  Demonstrated interest in learning. Expect positive outcomes,   Education material provided: ADA - How to Thrive: A Guide for Your Journey with Diabetes and A1C conversion sheet, Meal planning and carb counting, sleep hygiene  If problems or questions, patient to contact team via:  Phone and MyChart  Future DSME appointment: 2 months

## 2020-12-20 NOTE — Patient Instructions (Addendum)
Continue with the great changes you have made to improve your diet!  Increase your exercise by walking your dog 2x/day instead of just once.  Aim to eat balanced meals and snacks Vary your breakfast so you are not eating eggs every morning Use the Carb/meal Planning book for detailed lists of foods and carbs they contain. Use your blood sugar data to decide how much carbohydrates work for you. Consider writing your blood sugar in a log to make it easier for your health care providers to evaluate your blood sugar control. Consider the tips for better sleep

## 2021-01-06 ENCOUNTER — Telehealth: Payer: Self-pay | Admitting: *Deleted

## 2021-01-06 DIAGNOSIS — Z20822 Contact with and (suspected) exposure to covid-19: Secondary | ICD-10-CM

## 2021-01-06 NOTE — Telephone Encounter (Signed)
RN notified by HR that pt emailed [employer] reporting that her mother in the same household has tested positive for Covid.  Pt unable to fully isolate from mother as pt is primary caregiver. But they are both wearing masks when in common areas. Mothers sx started 5/13. Mothers day 5 5/18, so basing pt's first test date off first exposure to sx on 5/13, so test date 5/18. If negative, will continue to monitor for sx and retest 5/23, 5 days after mother's Day 5.  Pt took home test on Saturday that was negative. Advised her it was likely too early and reviewed testing guidelines. Will f/u with pt Tuesday for sx check.

## 2021-01-07 ENCOUNTER — Encounter: Payer: Self-pay | Admitting: *Deleted

## 2021-01-07 NOTE — Telephone Encounter (Signed)
Case discussed with RN Rolly Salter via telephone 01/06/2021. Agreed with plan of care regarding quarantine. Patient has received covid vaccines in 2021.  Patient to monitor for symptoms and discuss with supervisor if able to get capability to work from home since unable to fully quarantine from mother as primary caregiver.  Encouraged continue mask wear and sanitizing high touch surfaces daily.  Patient at high risk for severe covid disease consider monoclonal antibody treatment if positive test as untreated diabetes, obesity, morbidly obese, vitamin d deficiency and hyperlipidemia.

## 2021-01-07 NOTE — Telephone Encounter (Signed)
Reviewed RN Rolly Salter note patient continues asymptomatic and home testing planned tomorrow.  Agree with plan of care.

## 2021-01-07 NOTE — Telephone Encounter (Signed)
RN spoke with pt. She reports no sx for herself still. Mother's sx improving. Testing at home tomorrow. Will f/u Thursday 5/19 for results and sx check in.

## 2021-01-09 NOTE — Telephone Encounter (Signed)
Spoke with pt by phone. She reports mother's sx have resolved for at least 2 days now. Pt still asymptomatic. Home test 5/18 was negative. For now, will continue having pt quarantine due to inability to isolate from mother. Will do another home test 5/23, 5 days after mother's day 5. Planned RTW 5/24 as long as test negative and no sx. If sx develop, will reset timeline to that date.

## 2021-01-09 NOTE — Telephone Encounter (Signed)
Reviewed RN Rolly Salter note; negative covid test repeat testing 5/23 and patient continues asymptomatic.  Agreed with plan of care.

## 2021-01-13 NOTE — Telephone Encounter (Signed)
Reviewed RN Rolly Salter note; negative home test agreed with plan of care return to work onsite tomorrow.

## 2021-01-13 NOTE — Telephone Encounter (Signed)
Pt reports negative home test this morning 5/23. Still asymptomatic. Cleared to RTW tomorrow 5/24 with strict mask use thru 5/28.

## 2021-01-16 NOTE — Telephone Encounter (Signed)
Confirmed with pt she did RTW 5/24 as expected. Denies needs or concerns. Continuing strict mask use thru 5/28. Closing encounter.

## 2021-01-16 NOTE — Telephone Encounter (Signed)
Reviewed RN Rolly Salter note patient returned to work as expected with strict mask wear and still asymptomatic.  Agreed with plan of care.

## 2021-01-21 NOTE — Telephone Encounter (Signed)
Patient contacted via telephone continued asymptomatic and no questions or concerns.  Verified next lab draw with RN Rolly Salter 16 Jun at 1030 fasting also.  Patient verbalized understanding information/instructions, agreed with plan of care and had no further questions at this time.  Patient A&Ox3 spoke full sentences without difficulty no nasal congestion/throat clearing/cough or nasal sniffing noted during 3 minute telephone call.

## 2021-02-06 ENCOUNTER — Other Ambulatory Visit: Payer: Self-pay

## 2021-02-06 ENCOUNTER — Ambulatory Visit: Payer: Self-pay | Admitting: *Deleted

## 2021-02-06 DIAGNOSIS — E559 Vitamin D deficiency, unspecified: Secondary | ICD-10-CM

## 2021-02-06 DIAGNOSIS — Z6841 Body Mass Index (BMI) 40.0 and over, adult: Secondary | ICD-10-CM

## 2021-02-06 DIAGNOSIS — E1165 Type 2 diabetes mellitus with hyperglycemia: Secondary | ICD-10-CM

## 2021-02-06 NOTE — Progress Notes (Signed)
Non-fasting labs per NP orders.

## 2021-02-07 LAB — HEMOGLOBIN A1C
Est. average glucose Bld gHb Est-mCnc: 146 mg/dL
Hgb A1c MFr Bld: 6.7 % — ABNORMAL HIGH (ref 4.8–5.6)

## 2021-02-07 LAB — VITAMIN D 25 HYDROXY (VIT D DEFICIENCY, FRACTURES): Vit D, 25-Hydroxy: 29.1 ng/mL — ABNORMAL LOW (ref 30.0–100.0)

## 2021-02-07 LAB — MICROALBUMIN / CREATININE URINE RATIO
Creatinine, Urine: 112.5 mg/dL
Microalb/Creat Ratio: 3 mg/g creat (ref 0–29)
Microalbumin, Urine: 3.2 ug/mL

## 2021-02-10 ENCOUNTER — Encounter: Payer: Self-pay | Admitting: Registered Nurse

## 2021-02-19 ENCOUNTER — Ambulatory Visit: Payer: No Typology Code available for payment source | Admitting: Registered"

## 2021-03-21 ENCOUNTER — Ambulatory Visit: Payer: No Typology Code available for payment source | Admitting: Registered"

## 2021-05-20 ENCOUNTER — Other Ambulatory Visit: Payer: Self-pay

## 2021-05-20 ENCOUNTER — Encounter: Payer: Self-pay | Admitting: Registered Nurse

## 2021-05-20 ENCOUNTER — Ambulatory Visit: Payer: Self-pay | Admitting: Registered Nurse

## 2021-05-20 VITALS — HR 70 | Resp 16

## 2021-05-20 DIAGNOSIS — R2231 Localized swelling, mass and lump, right upper limb: Secondary | ICD-10-CM

## 2021-05-20 DIAGNOSIS — E119 Type 2 diabetes mellitus without complications: Secondary | ICD-10-CM

## 2021-05-20 DIAGNOSIS — L309 Dermatitis, unspecified: Secondary | ICD-10-CM

## 2021-05-20 DIAGNOSIS — M659 Synovitis and tenosynovitis, unspecified: Secondary | ICD-10-CM

## 2021-05-20 DIAGNOSIS — E559 Vitamin D deficiency, unspecified: Secondary | ICD-10-CM

## 2021-05-20 MED ORDER — ACETAMINOPHEN 500 MG PO TABS: 1000.0000 mg | ORAL_TABLET | Freq: Four times a day (QID) | ORAL | 0 refills | Status: AC | PRN

## 2021-05-20 NOTE — Patient Instructions (Addendum)
De Quervain's Tenosynovitis De Quervain's tenosynovitis is a condition that causes inflammation of the tendon on the thumb side of the wrist. Tendons are cords of tissue that connect bones to muscles. The tendons in the hand pass through a tunnel called a sheath. A slippery layer of tissue (synovium) lets the tendons move smoothly in the sheath. With de Quervain's tenosynovitis, the sheath swells or thickens, causing friction and pain. The condition is also called de Quervain's disease and de Quervain's syndrome. It occurs most often in women who are 47-47 years old. What are the causes? The exact cause of this condition is not known. It may be associated with overuse of the hand and wrist. What increases the risk? You are more likely to develop this condition if you: Use your hands far more than normal, especially if you repeat certain movements that involve twisting your hand or using a tight grip. Are pregnant. Are a middle-aged woman. Have rheumatoid arthritis. Have diabetes. What are the signs or symptoms? The main symptom of this condition is pain on the thumb side of the wrist. The pain may get worse when you grasp something or turn your wrist. Other symptoms may include: Pain that extends up the forearm. Swelling of your wrist and hand. Trouble moving the thumb and wrist. A sensation of snapping in the wrist. A bump filled with fluid (cyst) in the area of the pain. How is this diagnosed? This condition may be diagnosed based on: Your symptoms and medical history. A physical exam. During the exam, your health care provider may do a simple test (Finkelstein test) that involves pulling your thumb and wrist to see if this causes pain. You may also need to have an X-ray or ultrasound. How is this treated? Treatment for this condition may include: Avoiding any activity that causes pain and swelling. Taking medicines. Anti-inflammatory medicines and corticosteroid injections may be used  to reduce inflammation and relieve pain. Wearing a splint. Having surgery. This may be needed if other treatments do not work. Once the pain and swelling have gone down, you may start: Physical therapy. This includes exercises to improve movement and strength in your wrist and thumb. Occupational therapy. This includes adjusting how you move your wrist. Follow these instructions at home: If you have a splint: Wear the splint as told by your health care provider. Remove it only as told by your health care provider. Loosen the splint if your fingers tingle, become numb, or turn cold and blue. Keep the splint clean. If the splint is not waterproof: Do not let it get wet. Cover it with a watertight covering when you take a bath or a shower. Managing pain, stiffness, and swelling  Avoid movements and activities that cause pain and swelling in the wrist area. If directed, put ice on the painful area. This may be helpful after doing activities that involve the sore wrist. To do this: Put ice in a plastic bag. Place a towel between your skin and the bag. Leave the ice on for 20 minutes, 2-3 times a day. Remove the ice if your skin turns bright red. This is very important. If you cannot feel pain, heat, or cold, you have a greater risk of damage to the area. Move your fingers often to reduce stiffness and swelling. Raise (elevate) the injured area above the level of your heart while you are sitting or lying down. General instructions Return to your normal activities as told by your health care provider. Ask your health   care provider what activities are safe for you. Take over-the-counter and prescription medicines only as told by your health care provider. Keep all follow-up visits. This is important. Contact a health care provider if: Your pain medicine does not help. Your pain gets worse. You develop new symptoms. Summary De Quervain's tenosynovitis is a condition that causes inflammation  of the tendon on the thumb side of the wrist. The condition occurs most often in women who are 47-47 years old. The exact cause of this condition is not known. It may be associated with overuse of the hand and wrist. Treatment starts with avoiding activity that causes pain or swelling in the wrist area. Other treatments may include wearing a splint and taking medicine. Sometimes, surgery is needed. This information is not intended to replace advice given to you by your health care provider. Make sure you discuss any questions you have with your health care provider. Document Revised: 11/22/2019 Document Reviewed: 11/22/2019 Elsevier Patient Education  2022 [employer]  Ganglion Cyst A ganglion cyst is a non-cancerous, fluid-filled lump of tissue that occurs near a joint, tendon, or ligament. The cyst grows out of a joint or the lining of a tendon or ligament. Ganglion cysts most often develop in the hand or wrist, but they can also develop in the shoulder, elbow, hip, knee, ankle, or foot. Ganglion cysts are ball-shaped or egg-shaped. Their size can range from the size of a pea to larger than a grape. Increased activity may cause the cyst to get bigger because more fluid starts to build up. What are the causes? The exact cause of this condition is not known, but it may be related to: Inflammation or irritation around the joint. An injury or tear in the layers of tissue around the joint (joint capsule). Repetitive movements or overuse. History of acute or repeated injury. What increases the risk? You are more likely to develop this condition if: You are a female. You are 47-47 years old. What are the signs or symptoms? The main symptom of this condition is a lump. It most often appears on the hand or wrist. In many cases, there are no other symptoms, but a cyst can sometimes cause: Tingling. Pain or tenderness. Numbness. Weakness or loss of strength in the affected joint. Decreased range  of motion in the affected area of the body. How is this diagnosed? Ganglion cysts are usually diagnosed based on a physical exam. Your health care provider will feel the lump and may shine a light next to it. If it is a ganglion cyst, the light will likely shine through it. Your health care provider may order an X-ray, ultrasound, MRI, or CT scan to rule out other conditions. How is this treated? Ganglion cysts often go away on their own without treatment. If you have pain or other symptoms, treatment may be needed. Treatment is also needed if the ganglion cyst limits your movement or if it gets infected. Treatment may include: Wearing a brace or splint on your wrist or finger. Taking anti-inflammatory medicine. Having fluid drained from the lump with a needle (aspiration). Getting an injection of medicine into the joint to decrease inflammation. This may be corticosteroids, ethanol, or hyaluronidase. Having surgery to remove the ganglion cyst. Placing a pad in your shoe or wearing shoes that will not rub against the cyst if it is on your foot. Follow these instructions at home: Do not press on the ganglion cyst, poke it with a needle, or hit it. Take over-the-counter  and prescription medicines only as told by your health care provider. If you have a brace or splint: Wear it as told by your health care provider. Remove it as told by your health care provider. Ask if you need to remove it when you take a shower or a bath. Watch your ganglion cyst for any changes. Keep all follow-up visits as told by your health care provider. This is important. Contact a health care provider if: Your ganglion cyst becomes larger or more painful. You have pus coming from the lump. You have weakness or numbness in the affected area. You have a fever or chills. Get help right away if: You have a fever and have any of these in the cyst area: Increased redness. Red streaks. Swelling. Summary A ganglion cyst  is a non-cancerous, fluid-filled lump that occurs near a joint, tendon, or ligament. Ganglion cysts most often develop in the hand or wrist, but they can also develop in the shoulder, elbow, hip, knee, ankle, or foot. Ganglion cysts often go away on their own without treatment. This information is not intended to replace advice given to you by your health care provider. Make sure you discuss any questions you have with your health care provider. Document Revised: 11/01/2019 Document Reviewed: 11/01/2019 Elsevier Patient Education  2022 [employer].

## 2021-05-20 NOTE — Progress Notes (Signed)
Subjective:    Patient ID: Danielle Cooke, female    DOB: [birth year], 45 y.o.   MRN: [id number]  45y/o Caucasian established female pt c/o mobile bump on anterior R hand. Noticed it 4 days ago. Nonpainful but can move bump around under skin. Does not follow a vein or other structure. Also c/o pain to R hand at base of thumb. 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  Denied swelling/bruising/rash/tingling/numbness of fingers/hand or weakness.  Patient also stated rash on thigh has recurred and she started applying triamcinolone cream again.  Patient left hand dominant.       Review of Systems  Constitutional:  Positive for activity change. Negative for appetite change, chills, diaphoresis, fatigue, fever and unexpected weight change.  HENT:  Negative for trouble swallowing and voice change.   Eyes:  Negative for photophobia and visual disturbance.  Respiratory:  Negative for cough, shortness of breath, wheezing and stridor.   Cardiovascular:  Negative for chest pain.  Gastrointestinal:  Negative for nausea and vomiting.  Endocrine: Negative for cold intolerance and heat intolerance.  Genitourinary:  Negative for difficulty urinating.  Musculoskeletal:  Positive for arthralgias. Negative for gait problem.  Skin:  Positive for rash. Negative for wound.  Allergic/Immunologic: Negative for food allergies.  Neurological:  Negative for dizziness, tremors, seizures, syncope, facial asymmetry, speech difficulty, weakness, light-headedness, numbness and headaches.  Hematological:  Negative for adenopathy. Does not bruise/bleed easily.  Psychiatric/Behavioral:  Negative for agitation, confusion and sleep disturbance.       Objective:   Physical Exam Vitals and nursing note reviewed.  Constitutional:      General: She is awake. She is not in acute distress.    Appearance: Normal appearance. She  is well-developed and well-groomed. She is obese. She is not ill-appearing, toxic-appearing or diaphoretic.  HENT:     Head: Normocephalic and atraumatic.     Jaw: There is normal jaw occlusion.     Salivary Glands: Right salivary gland is not diffusely enlarged. Left salivary gland is not diffusely enlarged.     Right Ear: Hearing and external ear normal.     Left Ear: Hearing and external ear normal.     Nose: Nose normal. No congestion or rhinorrhea.     Mouth/Throat:     Lips: Pink. No lesions.     Mouth: Mucous membranes are moist. No oral lesions or angioedema.     Tongue: No lesions. Tongue does not deviate from midline.     Palate: No mass and lesions.     Pharynx: Oropharynx is clear.  Eyes:     General: Lids are normal. Vision grossly intact. Gaze aligned appropriately. No scleral icterus.       Right eye: No discharge.        Left eye: No discharge.     Extraocular Movements: Extraocular movements intact.     Conjunctiva/sclera: Conjunctivae normal.     Pupils: Pupils are equal, round, and reactive to light.  Neck:     Trachea: Trachea and phonation normal. No tracheal deviation.  Cardiovascular:     Rate and Rhythm: Normal rate and regular rhythm.     Pulses: Normal pulses.          Radial pulses are 2+ on the right side and 2+ on the left side.  Pulmonary:     Effort: Pulmonary effort is normal. No respiratory distress.  Breath sounds: Normal breath sounds and air entry. No stridor or transmitted upper airway sounds. No wheezing.     Comments: Spoke full sentences without difficulty; no cough observed in exam room Abdominal:     Palpations: Abdomen is soft.  Musculoskeletal:        General: Tenderness present. No swelling. Normal range of motion.     Right elbow: No swelling, deformity, effusion or lacerations. Normal range of motion.     Left elbow: No swelling, deformity, effusion or lacerations. Normal range of motion.     Right forearm: No swelling, edema,  deformity, lacerations, tenderness or bony tenderness.     Left forearm: No swelling, edema, deformity, lacerations, tenderness or bony tenderness.     Right wrist: Tenderness and crepitus present. No swelling, deformity, effusion, lacerations or snuff box tenderness. Normal range of motion.     Left wrist: No swelling, deformity, effusion, lacerations, tenderness, bony tenderness, snuff box tenderness or crepitus. Normal range of motion.     Right hand: Deformity and tenderness present. No swelling, lacerations or bony tenderness. Normal range of motion. Normal strength. Normal sensation. Normal capillary refill.     Left hand: No swelling, deformity, lacerations, tenderness or bony tenderness. Normal range of motion. Normal strength. Normal sensation. Normal capillary refill.       Arms:     Cervical back: Normal range of motion and neck supple. No swelling, edema, deformity, erythema, signs of trauma, lacerations, rigidity or crepitus. Normal range of motion.     Thoracic back: No swelling, edema, deformity, signs of trauma or lacerations. Normal range of motion.     Comments: 10mm nodule firm well demarcated dorsum right hand mobile underneath skin without pain freely moveable from MCPs to wrist medially/laterally/superior and distally (push gently with finger); + finkelstein's right; negative finkelstein's test left; resistance with flexion/extension/abduction/adduction thumb slightly painful right; crepitus right wrist will ulnar and radial deviation; no erythema/edema/bruising bilaterally  Lymphadenopathy:     Head:     Right side of head: No submandibular or preauricular adenopathy.     Left side of head: No submandibular or preauricular adenopathy.     Cervical: No cervical adenopathy.     Right cervical: No superficial cervical adenopathy.    Left cervical: No superficial cervical adenopathy.  Skin:    General: Skin is warm and dry.     Capillary Refill: Capillary refill takes less than  2 seconds.     Coloration: Skin is not ashen, cyanotic, jaundiced, mottled, pale or sallow.     Findings: Lesion present. No abrasion, abscess, acne, bruising, burn, ecchymosis, erythema, signs of injury, laceration, petechiae or wound.     Nails: There is no clubbing.          Comments: Deferred physical exam thigh rash  Neurological:     General: No focal deficit present.     Mental Status: She is alert and oriented to person, place, and time. Mental status is at baseline.     GCS: GCS eye subscore is 4. GCS verbal subscore is 5. GCS motor subscore is 6.     Cranial Nerves: Cranial nerves are intact. No cranial nerve deficit, dysarthria or facial asymmetry.     Sensory: Sensation is intact. No sensory deficit.     Motor: Motor function is intact. No weakness, tremor, atrophy, abnormal muscle tone or seizure activity.     Coordination: Coordination is intact. Coordination normal.     Gait: Gait is intact. Gait normal.  Comments: In/out of chair without difficulty; gait sure and steady in clinic; bilateral hand grasp equal 5/5  Psychiatric:        Attention and Perception: Attention and perception normal.        Mood and Affect: Mood and affect normal.        Speech: Speech normal.        Behavior: Behavior normal. Behavior is cooperative.        Thought Content: Thought content normal.        Cognition and Memory: Cognition and memory normal.        Judgment: Judgment normal.    Results for TANESIA, BUTNER" (MRN [id number]) as of 05/20/2021 20:58  Ref. Range 10/10/2020 09:45 02/06/2021 10:41  Vitamin D, 25-Hydroxy Latest Ref Range: 30.0 - 100.0 ng/mL 16.2 (L) 29.1 (L)   Results for TAJI, SATHER" (MRN [id number]) as of 05/20/2021 20:58  Ref. Range 12/28/2019 09:52 06/19/2020 00:00 10/10/2020 09:45  Hemoglobin A1C Latest Ref Range: 4.8 - 5.6 % 7.3 (H)  7.6 (H)       Assessment & Plan:  A-dequervain's tenosynovitis right initial encounter, dermatitis lower extremity,  subcutaneous nodule right hand initial visit; vitamin D deficiency, type II diabetes with Hgba1c goal less than 7  P-Fitted and distributed right medium thumb spica wrist splint from clinic stock.  Patient has reusable ice pack at home.  Discussed may hand wash and air dry or use blowdryer but do not place splint in drying machine for clothes as can melt plastic.  Biofreeze gel apply topical QID prn pain Tylenol 1000mg  po QID prn pain x 2 weeks trial.  Lift fewer plates at one time-3 at a time due to weight.  Discussed mild phalen's positive wear splints at night and wear left when at work due to dequervains tenosynovitis  Patient was instructed to rest, ice and elevate right hand/arm if swelling noted after work.  Cryotherapy 15 minutes TID prn pain/swelling. [facility] handout on dequervains tenosynovitis printed and given to patient. Medications as directed. 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 Call or return to clinic as needed if these symptoms worsen  or fail to improve as anticipated and will consider PT and orthopedics evaluation.  Next follow up evaluation to be scheduled with RN in 2 weeks.  Discussed tendon inflamed and brace/tylenol/ice help to improve.  Avoid starting new activities that utilize hands in the next two weeks and then only 15 minutes per day after that time of rest.  Patient verbalized agreement and understanding of treatment plan and had no further questions at this time. P2: ROM, injury prevention    Restart triamcinolone 0.1 topical BID prn rash.  If no improved in 2 weeks to notify provider.  zyrtec 10mg  po BID prn itching.  Or calagel thin smear BID prn itching.  Do not get in eyes; if worsening with calagel use stop  Wash hands before and after application.  Avoid hot steam showers.  Apply emollient twice a day e.g. Fragrance free vaseline/aquaphor/eucerin.  May apply ice/cold compress 5 minutes QID prn  itching/swelling.  Shower when she finishes work/prior to bedtime.  Avoid harsh/abrasive soaps use fragrance free/sensitive like dove/cetaphil.    Medication as directed. Call or return to clinic as needed if these symptoms worsen or fail to improve as anticipated.   Follow up for re-evaluation sooner if signs of infection e.g. red streaks, fever, purulent discharge or worsening of rash with  plan of care. Patient verbalized agreement and understanding of treatment plan and had no further questions at this time   Dr Sullivan Lone contacted regarding hand nodule as not typical presentation ganglion cyst, tendonitis, foreign body, dermatitis/infection, vein valve dysfunction.  He suspects it is ganglion cyst.  Will continue to monitor and follow up in 2 weeks if worsening will refer to orthopedics/consider imaging.  Patient notified after clinic visit/agreed with plan of care and no further questions at this time.  Patient due for quarterly Hgba1c and Vitamin D level follow up.  RN Rolly Salter notified to schedule patient for nonfasting labs in October.  Patient had ophthalmology exam Jun 2022 see Epic note from provider. Patient to have weight/BP check at lab visit.

## 2021-06-10 ENCOUNTER — Ambulatory Visit: Payer: Self-pay | Admitting: *Deleted

## 2021-06-10 ENCOUNTER — Other Ambulatory Visit: Payer: Self-pay

## 2021-06-10 DIAGNOSIS — E119 Type 2 diabetes mellitus without complications: Secondary | ICD-10-CM

## 2021-06-10 DIAGNOSIS — E559 Vitamin D deficiency, unspecified: Secondary | ICD-10-CM

## 2021-06-10 NOTE — Progress Notes (Signed)
Repeat labs per NP Tina's orders.  Reports 40 pound wt loss in past 6 months. Verbal weight from home given and entered.

## 2021-06-11 LAB — VITAMIN D 25 HYDROXY (VIT D DEFICIENCY, FRACTURES): Vit D, 25-Hydroxy: 23.2 ng/mL — ABNORMAL LOW (ref 30.0–100.0)

## 2021-06-11 LAB — HEMOGLOBIN A1C
Est. average glucose Bld gHb Est-mCnc: 134 mg/dL
Hgb A1c MFr Bld: 6.3 % — ABNORMAL HIGH (ref 4.8–5.6)

## 2021-06-11 MED ORDER — CHOLECALCIFEROL 1.25 MG (50000 UT) PO CAPS: 50000.0000 [IU] | ORAL_CAPSULE | ORAL | 0 refills | Status: AC

## 2021-06-11 NOTE — Addendum Note (Signed)
Addended by: Albina Billet A on: 06/11/2021 10:12 AM   Modules accepted: Orders

## 2021-06-12 NOTE — Progress Notes (Signed)
Noted follow up labs scheduled 

## 2021-06-12 NOTE — Progress Notes (Signed)
Appt made for 09/01/21 at 10:00 for repeat nonfasting labs and sent to pt by email.

## 2021-06-19 ENCOUNTER — Other Ambulatory Visit: Payer: Self-pay

## 2021-06-19 ENCOUNTER — Ambulatory Visit: Payer: Self-pay | Admitting: Registered Nurse

## 2021-06-19 ENCOUNTER — Encounter: Payer: Self-pay | Admitting: Registered Nurse

## 2021-06-19 VITALS — BP 124/82

## 2021-06-19 DIAGNOSIS — H00015 Hordeolum externum left lower eyelid: Secondary | ICD-10-CM

## 2021-06-19 MED ORDER — CARBOXYMETHYLCELLULOSE SOD PF 0.5 % OP SOLN: 1.0000 [drp] | Freq: Three times a day (TID) | OPHTHALMIC | 0 refills | Status: AC | PRN

## 2021-06-19 MED ORDER — ERYTHROMYCIN 5 MG/GM OP OINT: 1.0000 "application " | TOPICAL_OINTMENT | Freq: Four times a day (QID) | OPHTHALMIC | 0 refills | Status: AC

## 2021-06-19 NOTE — Progress Notes (Signed)
Subjective:    Patient ID: Danielle Cooke, female    DOB: [birth year], 45 y.o.   MRN: [id number]  45y/o caucasian established female pt c/o problem with L eye. Stye appearing area over bottom eyelid but not causing pain, vision problems, etc. She only notices it when looking in a mirror. Present approx 2 weeks now.  Patient reported she still has bump on hand but today it is hiding and she cannot show it to me  Denied pain/enlargement.    Review of Systems  Constitutional:  Negative for activity change, appetite change, chills, diaphoresis, fatigue and fever.  HENT:  Negative for trouble swallowing and voice change.   Eyes:  Positive for redness. Negative for photophobia, pain, discharge, itching and visual disturbance.  Respiratory:  Negative for cough, shortness of breath, wheezing and stridor.   Cardiovascular:  Negative for chest pain.  Gastrointestinal:  Negative for diarrhea, nausea and vomiting.  Endocrine: Negative for cold intolerance and heat intolerance.  Genitourinary:  Negative for difficulty urinating.  Musculoskeletal:  Negative for gait problem, neck pain and neck stiffness.  Skin:  Positive for color change and rash.  Allergic/Immunologic: Negative for food allergies.  Neurological:  Negative for dizziness, facial asymmetry, light-headedness and headaches.  Hematological:  Negative for adenopathy. Does not bruise/bleed easily.  Psychiatric/Behavioral:  Negative for agitation, confusion and sleep disturbance.       Objective:   Physical Exam Vitals and nursing note reviewed.  Constitutional:      General: She is awake. She is not in acute distress.    Appearance: Normal appearance. She is well-developed and well-groomed. She is obese. She is not ill-appearing, toxic-appearing or diaphoretic.  HENT:     Head: Normocephalic and atraumatic. No right periorbital erythema or left periorbital erythema.     Jaw: There is normal jaw occlusion.     Salivary Glands: Right  salivary gland is not diffusely enlarged. Left salivary gland is not diffusely enlarged.     Right Ear: Hearing and external ear normal.     Left Ear: Hearing and external ear normal.     Nose: Nose normal. No congestion or rhinorrhea.     Mouth/Throat:     Lips: Pink. No lesions.     Mouth: Mucous membranes are moist.     Pharynx: Oropharynx is clear.  Eyes:     General: Lids are normal. Vision grossly intact. Gaze aligned appropriately. No allergic shiner or scleral icterus.       Right eye: No discharge.        Left eye: Hordeolum present.No discharge.     Extraocular Movements: Extraocular movements intact.     Right eye: Normal extraocular motion and no nystagmus.     Left eye: Normal extraocular motion and no nystagmus.     Conjunctiva/sclera:     Right eye: Right conjunctiva is not injected. No chemosis, exudate or hemorrhage.    Left eye: Left conjunctiva is injected. No chemosis, exudate or hemorrhage.    Pupils: Pupils are equal, round, and reactive to light.      Comments: Left lower lid central nummular erythema and eyelid conjunctiva with 2+/4 injection; no debris in lashes or discharge; patient with mascara and powder upper lashes/eyelids  Neck:     Trachea: Trachea and phonation normal. No tracheal deviation.  Cardiovascular:     Rate and Rhythm: Normal rate and regular rhythm.     Pulses:          Radial pulses are 2+ on  the right side and 2+ on the left side.  Pulmonary:     Effort: Pulmonary effort is normal.     Breath sounds: Normal breath sounds and air entry. No stridor or transmitted upper airway sounds. No wheezing.     Comments: Spoke full sentences without difficulty; no cough observed in exam room Abdominal:     General: Abdomen is flat.  Musculoskeletal:        General: Normal range of motion.     Right elbow: No swelling, deformity, effusion or lacerations. Normal range of motion.     Left elbow: No swelling, deformity, effusion or lacerations. Normal  range of motion.     Right wrist: No swelling, deformity, effusion or lacerations. Normal range of motion.     Left wrist: No swelling, deformity, effusion or lacerations. Normal range of motion.     Right hand: No swelling, deformity, lacerations, tenderness or bony tenderness. Normal range of motion. Normal strength.     Left hand: No swelling, deformity, lacerations, tenderness or bony tenderness. Normal range of motion. Normal strength.     Cervical back: Normal range of motion and neck supple. No edema, erythema, signs of trauma, rigidity or crepitus. No pain with movement. Normal range of motion.  Lymphadenopathy:     Head:     Right side of head: No submandibular or preauricular adenopathy.     Left side of head: No submandibular or preauricular adenopathy.     Cervical:     Right cervical: No superficial cervical adenopathy.    Left cervical: No superficial cervical adenopathy.  Skin:    General: Skin is warm and dry.     Capillary Refill: Capillary refill takes less than 2 seconds.     Coloration: Skin is not ashen, cyanotic, jaundiced, mottled, pale or sallow.     Findings: Rash present. No abrasion, abscess, acne, bruising, burn, ecchymosis, erythema, signs of injury, laceration, petechiae or wound. Rash is macular and papular. Rash is not crusting, nodular, purpuric, pustular, scaling, urticarial or vesicular.     Nails: There is no clubbing.     Comments: Left lower eyelid central papule 19mm with erythema surrounding no discharge noted  Neurological:     General: No focal deficit present.     Mental Status: She is alert and oriented to person, place, and time. Mental status is at baseline.     GCS: GCS eye subscore is 4. GCS verbal subscore is 5. GCS motor subscore is 6.     Cranial Nerves: Cranial nerves 2-12 are intact. No cranial nerve deficit, dysarthria or facial asymmetry.     Sensory: Sensation is intact. No sensory deficit.     Motor: Motor function is intact. No  weakness, tremor, atrophy, abnormal muscle tone or seizure activity.     Coordination: Coordination is intact. Coordination normal.     Gait: Gait is intact. Gait normal.     Comments: In/out of chair without difficulty; gait sure and steady in clinic; bilateral hand grasp equal 5/5  Psychiatric:        Attention and Perception: Attention and perception normal.        Mood and Affect: Mood and affect normal.        Speech: Speech normal.        Behavior: Behavior normal. Behavior is cooperative.        Thought Content: Thought content normal.        Cognition and Memory: Cognition and memory normal.  Judgment: Judgment normal.         Assessment & Plan:   A-hordeolum externum left lower eyelid initial visit  P-Hygiene discussed. Avoid eye makeup. Consider throwing away old makeup especially if styes reoccurring.   Shower after work and prior to bedtime.  If any drainage on sheets/pillowcase you may want to launder/change more often along with your bathroom towels.  I recommend washing in hot water with bleach if you are having purulent eye discharge.  Consider using disposable wipes/towels on face.  Recommended to wear glasses until swelling/irritation resolved. Patient to apply warm moist packs prn up to QID. Instructed patient to not rub eyes.  Do not squeeze or pop stye/lesion.  May use over the counter eye drops/tears for pain/symptom relief e.g refresh gtts 2 right eye TID x 7-14 days.  Erythromycin ophthalmic ointment apply 1 cm right eye QID x 7 days #1 RF0 dispensed from [facility] to patient.  Return to clinic if headache, fever greater than 100.14F, nausea/vomiting, purulent discharge/matting unable to open eye without using fingers, whole eyelid swells up or spreads to other part of your face, stye bleeds, foreign body sensation, ciliary flush, worsening photophobia or vision. Call or return to clinic as needed if these symptoms worsen or fail to improve as anticipated. Patient given  [facility] handout on hordeolum. If stye does not resolve within a couple weeks you may need to see optometrist/ophthalmologist to drain stye.  Typically a stye should get better and resolve in 1-2 weeks.  Patient verbalized agreement and understanding of treatment plan and had no further questions at this time. P2: Hand washing, avoid contact use-wear glasses.

## 2021-06-19 NOTE — Patient Instructions (Signed)
Stye A stye, also known as a hordeolum, is a bump that forms on an eyelid. It may look like a pimple next to the eyelash. A stye can form inside the eyelid (internal stye) or outside the eyelid (external stye). A stye can cause redness, swelling, and pain on the eyelid. Styes are very common. Anyone can get them at any age. They usually occur in just one eye at a time, but you may have more than one in either eye. What are the causes? A stye is caused by an infection. The infection is almost always caused by bacteria called Staphylococcus aureus. This is a common type of bacteria that lives on the skin. An internal stye may result from an infected oil-producing gland inside the eyelid. An external stye may be caused by an infection at the base of the eyelash (hair follicle). What increases the risk? You are more likely to develop a stye if: You have had a stye before. You have any of these conditions: Red, itchy, inflamed eyelids (blepharitis). A skin condition such as seborrheic dermatitis or rosacea. High fat levels in your blood (lipids). Dry eyes. What are the signs or symptoms? The most common symptom of a stye is eyelid pain. Internal styes are more painful than external styes. Other symptoms may include: Painful swelling of your eyelid. A scratchy feeling in your eye. Tearing and redness of your eye. A pimple-like bump on the edge of the eyelid. Pus draining from the stye. How is this diagnosed? Your health care provider may be able to diagnose a stye just by examining your eye. The health care provider may also check to make sure: You do not have a fever or other signs of a more serious infection. The infection has not spread to other parts of your eye or areas around your eye. How is this treated? Most styes will clear up in a few days without treatment or with warm compresses applied to the area. You may need to use antibiotic drops or ointment to treat an infection. Sometimes,  steroid drops or ointment are used in addition to antibiotics. In some cases, your health care provider may give you a small steroid injection in the eyelid. If your stye does not heal with routine treatment, your health care provider may drain pus from the stye using a thin blade or needle. This may be done if the stye is large, causing a lot of pain, or affecting your vision. Follow these instructions at home: Take over-the-counter and prescription medicines only as told by your health care provider. This includes eye drops or ointments. If you were prescribed an antibiotic medicine, steroid medicine, or both, apply or use them as told by your health care provider. Do not stop using the medicine even if your condition improves. Apply a warm, wet cloth (warm compress) to your eye for 5-10 minutes, 4 to 6 times a day. Clean the affected eyelid as directed by your health care provider. Do not wear contact lenses or eye makeup until your stye has healed and your health care provider says that it is safe. Do not try to pop or drain the stye. Do not rub your eye. Contact a health care provider if: You have chills or a fever. Your stye does not go away after several days. Your stye affects your vision. Your eyeball becomes swollen, red, or painful. Get help right away if: You have pain when moving your eye around. Summary A stye is a bump that forms   on an eyelid. It may look like a pimple next to the eyelash. A stye can form inside the eyelid (internal stye) or outside the eyelid (external stye). A stye can cause redness, swelling, and pain on the eyelid. Your health care provider may be able to diagnose a stye just by examining your eye. Apply a warm, wet cloth (warm compress) to your eye for 5-10 minutes, 4 to 6 times a day. This information is not intended to replace advice given to you by your health care provider. Make sure you discuss any questions you have with your health care  provider. Document Revised: 10/16/2020 Document Reviewed: 10/16/2020 [employer] Patient Education  2022 [employer]  

## 2021-06-26 ENCOUNTER — Encounter: Payer: Self-pay | Admitting: Registered Nurse

## 2021-06-26 ENCOUNTER — Other Ambulatory Visit: Payer: Self-pay

## 2021-06-26 ENCOUNTER — Ambulatory Visit: Payer: Self-pay | Admitting: Registered Nurse

## 2021-06-26 VITALS — BP 120/88 | HR 64 | Temp 98.7°F

## 2021-06-26 DIAGNOSIS — S65509A Unspecified injury of blood vessel of unspecified finger, initial encounter: Secondary | ICD-10-CM

## 2021-06-26 DIAGNOSIS — M659 Synovitis and tenosynovitis, unspecified: Secondary | ICD-10-CM

## 2021-06-26 DIAGNOSIS — S60021A Contusion of right index finger without damage to nail, initial encounter: Secondary | ICD-10-CM

## 2021-06-26 NOTE — Patient Instructions (Addendum)
Monitor for signs of decreased circulation (blue, cold) or infection hot to touch, red streaks, worsening pain/swelling)  Contusion A contusion is a deep bruise. Contusions are the result of a blunt injury to tissues and muscle fibers under the skin. The injury causes bleeding under the skin. The skin overlying the contusion may turn blue, purple, or yellow. Minor injuries will give you a painless contusion, but more severe injuries cause contusions that may stay painful and swollen for a few weeks. Follow these instructions at home: Pay attention to any changes in your symptoms. Let your health care provider know about them. Take these actions to relieve your pain. Managing pain, stiffness, and swelling  Use resting, icing, applying pressure (compression), and raising (elevating) the injured area. This is often called the RICE strategy. Rest the injured area. Return to your normal activities as told by your health care provider. Ask your health care provider what activities are safe for you. If directed, put ice on the injured area: Put ice in a plastic bag. Place a towel between your skin and the bag. Leave the ice on for 20 minutes, 2-3 times per day. If directed, apply light compression to the injured area using an elastic bandage. Make sure the bandage is not wrapped too tightly. Remove and reapply the bandage as directed by your health care provider. If possible, raise (elevate) the injured area above the level of your heart while you are sitting or lying down. General instructions Take over-the-counter and prescription medicines only as told by your health care provider. Keep all follow-up visits as told by your health care provider. This is important. Contact a health care provider if: Your symptoms do not improve after several days of treatment. Your symptoms get worse. You have difficulty moving the injured area. Get help right away if: You have severe pain. You have numbness in a  hand or foot. Your hand or foot turns pale or cold. Summary A contusion is a deep bruise. Contusions are the result of a blunt injury to tissues and muscle fibers under the skin. It is treated with rest, ice, compression, and elevation. You may be given over-the-counter medicines for pain. Contact a health care provider if your symptoms do not improve, or get worse. Get help right away if you have severe pain, have numbness, or the area turns pale or cold. This information is not intended to replace advice given to you by your health care provider. Make sure you discuss any questions you have with your health care provider. Document Revised: 03/31/2018 Document Reviewed: 03/31/2018 Elsevier Patient Education  2022 [employer] Nonsurgical Procedures for Varicose Veins, Care After This sheet gives you information about how to care for yourself after your procedure. Your health care provider may also give you more specific instructions. If you have problems or questions, contact your health care provider. What can I expect after the procedure? After the procedure, it is common to have: Swelling. Bruising. Soreness. Mild skin discoloration. Slight bleeding at the incision sites. Follow these instructions at home: Incision or puncture site care  Follow instructions from your health care provider about how to take care of your incision or puncture site. Make sure you: Wash your hands with soap and water before you change your bandage (dressing). If soap and water are not available, use hand sanitizer. Change your dressing as told by your health care provider. Leave skin glue or adhesive strips in place. These skin closures may need to stay in place for  2 weeks or longer. If adhesive strip edges start to loosen and curl up, you may trim the loose edges. Do not remove adhesive strips completely unless your health care provider tells you to do that. Check your incision or puncture area every day  for signs of infection. Check for: Redness, swelling, or pain. Fluid or blood. Warmth. Pus or a bad smell. General instructions  Take over-the-counter and prescription medicines only as told by your health care provider. Wear compression stockings as told by your health care provider. These stockings help to prevent blood clots and reduce swelling in your legs. Do not take baths, swim, or use a hot tub until your health care provider approves. Ask your health care provider if you can take showers. Wear loose-fitting clothing. Return to your normal activities as told by your health care provider. Ask your health care provider what activities are safe for you. Get regular daily exercise. Walk or ride a stationary bike daily or as told by your health care provider. Keep all follow-up visits as told by your health care provider. This is important. Contact a health care provider if: You have a fever. You have redness, swelling, or pain around your incision or puncture site. You have fluid or blood coming from your incision or puncture site. Your incision or puncture site feels warm to the touch. You have pus or a bad smell coming from your incision or puncture site. You develop a cough. Get help right away if: You pass out. You have very bad pain in your leg. You have leg pain that gets worse when you walk. You have redness or swelling in your leg that is getting worse. You have trouble breathing. You cough up blood. Summary After the procedure, it is common to have swelling, bruising, soreness, or mild skin discoloration. Follow instructions from your health care provider about how to take care of your incision or puncture site. Wear compression stockings as told by your health care provider. These stockings help to prevent blood clots and reduce swelling in your legs. This information is not intended to replace advice given to you by your health care provider. Make sure you discuss any  questions you have with your health care provider. Document Revised: 05/07/2020 Document Reviewed: 05/10/2020 Elsevier Patient Education  2022 [employer].

## 2021-06-26 NOTE — Progress Notes (Signed)
Subjective:    Patient ID: Danielle Cooke, female    DOB: 1975-09-29, 45 y.o.   MRN: [id number]  45y/o Caucasian established female pt that reports she was wrapping dishes yesterday at work and R index finger acutely became cold, numb, blue in color and distal joint was unable to be bent. It improved some quickly yesterday after using ice. Still stiff today but pins and needles numbness resolved today.  Bruise spread from thin line to 2 inches of finger now. Took motrin yesterday also.  Patient reported she has been wearing her thumb/wrist splint right and thumb feeling better but now has noticed left started to bother her same symptoms but just not as bad as other side.  Has been putting purse/bag strap handles on forearms so not  directly pressing on sore thumbs and difficulty to carry bags in hands with splint on over the last few weeks.  Does not have shoulder strap on her purse.  Patient denied weakness to bilateral hands.  Right hand dominant.  Found the little bump left hand again today it has decreased in size, denied new or worsening pain.  Stye left eye improved redness and irritation decreased since starting erythromycin ointment and refresh drops.  Patient reported has been washing face before bedtime.  Denied eye discharge/pain/blurred vision/fever/chills.  Thigh rash has returned after she stopped applying steroid topical.  Restarted topical steroid use again and improving.     Review of Systems  Constitutional:  Positive for activity change. Negative for appetite change, chills, diaphoresis, fatigue and fever.  HENT:  Negative for trouble swallowing and voice change.   Eyes:  Negative for photophobia and visual disturbance.  Respiratory:  Negative for cough, shortness of breath, wheezing and stridor.   Cardiovascular:  Negative for chest pain.  Gastrointestinal:  Negative for diarrhea, nausea and vomiting.  Endocrine: Negative for cold intolerance and heat intolerance.   Genitourinary:  Negative for difficulty urinating.  Musculoskeletal:  Positive for joint swelling and myalgias. Negative for back pain, gait problem, neck pain and neck stiffness.  Skin:  Positive for color change and rash. Negative for wound.  Allergic/Immunologic: Positive for environmental allergies. Negative for food allergies.  Neurological:  Positive for numbness. Negative for dizziness, tremors, seizures, syncope, facial asymmetry, speech difficulty, weakness, light-headedness and headaches.  Hematological:  Negative for adenopathy. Does not bruise/bleed easily.  Psychiatric/Behavioral:  Negative for agitation, confusion and sleep disturbance.       Objective:   Physical Exam Vitals and nursing note reviewed.  Constitutional:      General: She is awake. She is not in acute distress.    Appearance: Normal appearance. She is well-developed and well-groomed. She is obese. She is not ill-appearing, toxic-appearing or diaphoretic.  HENT:     Head: Normocephalic and atraumatic.     Jaw: There is normal jaw occlusion.     Salivary Glands: Right salivary gland is not diffusely enlarged. Left salivary gland is not diffusely enlarged.     Right Ear: Hearing and external ear normal.     Left Ear: Hearing and external ear normal.     Nose: Nose normal. No congestion or rhinorrhea.     Mouth/Throat:     Lips: Pink. No lesions.     Mouth: Mucous membranes are moist. No oral lesions or angioedema.     Dentition: No gum lesions.     Tongue: No lesions. Tongue does not deviate from midline.     Palate: No mass and lesions.  Pharynx: Oropharynx is clear. Uvula midline. No pharyngeal swelling or uvula swelling.  Eyes:     General: Lids are normal. Vision grossly intact. Gaze aligned appropriately. No allergic shiner, visual field deficit or scleral icterus.       Right eye: No discharge or hordeolum.        Left eye: Hordeolum present.No discharge.     Extraocular Movements: Extraocular  movements intact.     Right eye: Normal extraocular motion and no nystagmus.     Left eye: Normal extraocular motion and no nystagmus.     Conjunctiva/sclera:     Right eye: Right conjunctiva is not injected. No chemosis, exudate or hemorrhage.    Left eye: Left conjunctiva is injected. No chemosis, exudate or hemorrhage.    Pupils: Pupils are equal, round, and reactive to light.      Comments: Injection improved eyelid conjunctiva left lower eyelid 0-1+/4 today no debris in eyelashes/eyebrows; patient wearing eyeshadow no mascara today  Neck:     Trachea: Trachea and phonation normal. No tracheal deviation.  Cardiovascular:     Rate and Rhythm: Normal rate and regular rhythm.     Pulses: Normal pulses.          Radial pulses are 2+ on the right side and 2+ on the left side.  Pulmonary:     Effort: Pulmonary effort is normal. No respiratory distress.     Breath sounds: Normal breath sounds and air entry. No stridor or transmitted upper airway sounds. No decreased breath sounds or wheezing.     Comments: Spoke full sentences without difficulty; no cough observed in exam room Abdominal:     General: Abdomen is flat.  Musculoskeletal:        General: Swelling and tenderness present.     Right elbow: No swelling, deformity, effusion or lacerations. Normal range of motion. No tenderness.     Left elbow: No swelling, deformity, effusion or lacerations. Normal range of motion. No tenderness.     Right forearm: No swelling, edema, deformity, lacerations, tenderness or bony tenderness.     Left forearm: No swelling, edema, deformity, lacerations, tenderness or bony tenderness.     Right wrist: Tenderness present. No swelling, deformity, effusion, lacerations, bony tenderness, snuff box tenderness or crepitus. Normal range of motion.     Left wrist: Tenderness and crepitus present. No swelling, deformity, effusion, lacerations, bony tenderness or snuff box tenderness. Normal range of motion. Normal  pulse.     Right hand: Swelling and tenderness present. No deformity, lacerations or bony tenderness. Decreased range of motion. Normal strength. Normal sensation. Normal capillary refill. Normal pulse.     Left hand: Tenderness present. No swelling, deformity, lacerations or bony tenderness. Normal range of motion. Normal strength. Normal sensation. Normal capillary refill. Normal pulse.       Arms:       Hands:     Cervical back: Normal range of motion and neck supple. No edema, erythema, rigidity, torticollis or crepitus. No pain with movement. Normal range of motion.     Comments: Bilateral hand grasp equal 5/5; 0-1+/4 nonpitting edema left index finger adjacent to PIP (proximal) to DIP joint soft tissue no crepitus/fluctuance/erythema/wound; flexion decreased left 2nd PIP joint compared to right; mildly positive finkelsteins left and TTP left thumb joint proximal soft tissue; no palpable/visual edema noted bilateral thumbs  Lymphadenopathy:     Head:     Right side of head: No submandibular or preauricular adenopathy.     Left side  of head: No submandibular or preauricular adenopathy.     Cervical: No cervical adenopathy.     Right cervical: No superficial cervical adenopathy.    Left cervical: No superficial cervical adenopathy.  Skin:    General: Skin is warm and dry.     Capillary Refill: Capillary refill takes less than 2 seconds.     Coloration: Skin is not ashen, cyanotic, jaundiced, mottled, pale or sallow.     Findings: Bruising, ecchymosis and lesion present. No abrasion, abscess, acne, burn, erythema, signs of injury, laceration, petechiae, rash or wound. Rash is not crusting, papular, purpuric, pustular, scaling, urticarial or vesicular.     Nails: There is no clubbing.          Comments: Ecchymosis anterior left 2nd digit; nodule mobile dorsum left hand 53mm diameter  Neurological:     General: No focal deficit present.     Mental Status: She is alert and oriented to  person, place, and time. Mental status is at baseline.     GCS: GCS eye subscore is 4. GCS verbal subscore is 5. GCS motor subscore is 6.     Cranial Nerves: Cranial nerves 2-12 are intact. No cranial nerve deficit, dysarthria or facial asymmetry.     Sensory: Sensation is intact. No sensory deficit.     Motor: Motor function is intact. No weakness, tremor, atrophy, abnormal muscle tone or seizure activity.     Coordination: Coordination is intact. Coordination normal.     Gait: Gait is intact. Gait normal.     Comments: In/out of chair without difficulty; gait sure and steady in clinic; bilateral hand grasp equal 5/5  Psychiatric:        Attention and Perception: Attention and perception normal.        Mood and Affect: Mood and affect normal.        Speech: Speech normal.        Behavior: Behavior normal. Behavior is cooperative.        Thought Content: Thought content normal.        Cognition and Memory: Cognition and memory normal.        Judgment: Judgment normal.    Fitted and distributed left medium wrist spica splint from clinic stock to patient.      Assessment & Plan:   A-contusion right 2nd finger without nail damage initial visit; injury finger blood vessel initial visit, left tenosynovitis hand initial visit, hordeolum externum left subsequent visit  P-P-Patient was instructed to rest, ice and elevate right hand on break/lunch and when at home the next couple of days. Consider compression.  Has reusable ice pack at home.  Tylenol 1000mg  po every 6 hours prn pain or Motrin 800mg  po TID prn pain.  Patient prefers to use OTC she has in purse/at home versus dispense from clinic formulary.  Epsom salt soaks daily may be helpful for swelling and pain.  Cryotherapy 15 minutes QID prn pain/swelling.   Exitcare handouts on contusion, nonsurgical varicose vein procedure care. Medications as directed. Call or return to clinic as needed if these symptoms worsen or fail to improve as  anticipated especially if extremity worsening swelling/cold/decreased capillary refill.  Avoid wrapping bag straps around right forearm/hand.  Use shoulder strap or left hand until right hand bruise healed. Same day re-evaluation if decreased capillary refill, red streaks on finger, worsening swelling/pain.  DDx vein valve dysfunction, vein injury, varicose vein. Discussed vein low pressure system and when straps around finger/hand/forearm stops blood return to heart  and pressure builds in extremity vein--weak area can burst or sides of vein where weak enlarge (aneurysm) and cause injury to surrounding tissues.  Heavy bag straps on forearms/hands.  Use roller bag or shoulder bag/backpack. Discussed symptoms of blood clot with patient and if these develop same day re-evaluation recommended with a provider.  Patient established with vascular office.  If worsening symptoms I do recommend imaging/US.  Full function and strength bilateral hands no work restrictions needed at this time per patient able to perform all duties.  If restrictions needed would require appt [facility] as I am unable to write restrictions in this clinic due to contract limitrations.  Patient verbalized agreement and understanding of treatment plan and had no further questions at this time. P2: ROM, injury prevention    Patient with initially right tenosynovitis and now left becoming symptomatic most likely due to trying to allow right to rest/heal and compensating for thumb spica/wrist splint wear on right.  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  Varying weights and sizes tablewear.  Continue stretches and exercises bilateral hands.  May use ice 15 minutes four times per day.  Motrin 800mg  every 8 hours as needed or tylenol 1000mg  oral every 6 hours as needed.  Tylenol preferred if blood pressure elevated.  Follow up for re-evaulation if no improvement with  2 weeks of splint use.  Patient instructed on splint care e.g. hand wash/air or blow dry with hand dryer.  Patient verbalized understanding information/instructions, agreed with plan of care and had no further questions at this time.  Hordeolum resolving/symptoms improving since 27 Oct visit.  Continue plan of care as previously discussed.  Patient verbalized understanding information/instructions, agreed with plan of care and had no further questions at this time.

## 2021-07-01 ENCOUNTER — Telehealth: Payer: Self-pay

## 2021-07-01 NOTE — Telephone Encounter (Signed)
Patient left VM to request appt for a swollen and numb index finger. She has only been seen by [facility] for varicose veins. Left return vm that probably did not necessarily sound vascular in nature, but if current provider wanted a vascular evaluation of her finger to send in referral.

## 2021-07-18 ENCOUNTER — Telehealth: Payer: Self-pay | Admitting: Registered Nurse

## 2021-07-18 ENCOUNTER — Encounter: Payer: Self-pay | Admitting: Registered Nurse

## 2021-07-18 DIAGNOSIS — S65509D Unspecified injury of blood vessel of unspecified finger, subsequent encounter: Secondary | ICD-10-CM

## 2021-07-18 DIAGNOSIS — Z20822 Contact with and (suspected) exposure to covid-19: Secondary | ICD-10-CM

## 2021-07-18 NOTE — Telephone Encounter (Signed)
RN Rolly Salter notified patient via telephone on 07/15/21 instructions for close covid contact after notification from supervisor Mabeline Caras patient had been to event over the weekend with another employee who tested positive for covid on 07/15/21.  Symptoms started day prior for coworker but exposure was at non-work event.  Patient with close covid contact 07/12/21 to monitor for covid symptoms and mask wear when around others and no eating in employee lunch room x 10 days. Contacted patient via telephone today and she stated home covid test 07/17/21 negative Day 5.  Day 10 07/22/21 last day strict mask use and no eating in employee lunch room.  Spoke with patient today via telephone continues asymptomatic.    Patient also notified me Tuesday 11/22 when unboxing product in showroom finger bruised and swelled up again right index.  She is thinking it is never getting a chance to heal.  Asked patient if she was able to schedule appt with vascular office and she stated was told she needs new referral since last appt over a year ago and this is a new problem/body part.  Discussed with patient new referral will be entered into epic today and RN Rolly Salter will contact office on 11/29 when she returns to ensure referral received or if faxed copy needs to be sent.  Continue plan of care as previously discussed at office visit 06/26/21.  May see RN Reece Packer at clinic today for finger splint.  RN Katrinka Blazing contacted via telephone and notified patient was instructed to come for splint today from clinic stock.  Patient to contact me at [phone] if new or worsening symptoms this weekend.  Patient verbalized understanding information/instructions, agreed with plan of care and had no further questions at this time.

## 2021-07-22 NOTE — Telephone Encounter (Signed)
[facility] confirmed they have received referral and are working on it. They will contact pt soon.

## 2021-07-22 NOTE — Telephone Encounter (Signed)
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  Wearing face mask.  Skin warm dry and pink respiration even and unlabored  Gait sure and steady moving product.  A&Ox3 spoke full sentences without difficulty no cough/congestion/throat clearing noted during 3 minute conversation.  Discussed RN Rolly Salter faxed/contacted [facility] office to verify receipt of referral.  Patient feeling well and continues asymptomatic after covid exposure.  Encounter closed for covid monitoring post exposure day 10 today.

## 2021-07-22 NOTE — Telephone Encounter (Signed)
Patient did receive finger splint from [facility] and utilizing it. Denied questions or concerns at this time.

## 2021-07-22 NOTE — Telephone Encounter (Signed)
Day 10 post exposure. Called for sx check and to update regarding [facility] referral. No answer. LVMRCB.

## 2021-07-28 NOTE — Telephone Encounter (Signed)
Message left for patient to verify if vascular appt scheduled and if symptoms improved or worsening.

## 2021-07-30 NOTE — Telephone Encounter (Signed)
Patient replied she still does not have scheduled appt but finger splint has helped and pain/bruising decreased.  Patient encouraged to contact provider office to schedule appt.

## 2021-08-03 NOTE — Telephone Encounter (Signed)
Patient notified received message from vascular surgery office and provider does not see finger complaints. If recurrent symptoms will refer to orthopedics or hand.   Patient stated splint has been helping pain/swelling/bruising resolved and she has continued to wear splint to prevent re-injury.    Patient also stated leg rash resolved.  Patient verbalized understanding information/instructions, agreed with plan of care and had no further questions at this time.

## 2021-08-21 ENCOUNTER — Other Ambulatory Visit: Payer: Self-pay | Admitting: *Deleted

## 2021-08-21 DIAGNOSIS — L309 Dermatitis, unspecified: Secondary | ICD-10-CM

## 2021-08-21 MED ORDER — TRIAMCINOLONE ACETONIDE 0.1 % EX CREA
1.0000 | TOPICAL_CREAM | Freq: Two times a day (BID) | CUTANEOUS | 0 refills | Status: DC
Start: 2021-08-21 — End: 2022-11-17

## 2021-08-21 NOTE — Telephone Encounter (Signed)
Pt requested refill of Triamcinolone cream for thigh rash that is recurring.

## 2021-08-22 ENCOUNTER — Encounter: Payer: Self-pay | Admitting: *Deleted

## 2021-08-22 NOTE — Telephone Encounter (Signed)
RN Rolly Salter discussed case with me in clinic earlier today.  Refill Rx approved for patient and sent electronically to her pharmacy of choice.   She is to follow up for re-evaluation if new or worsening symptoms or rash does not resolve with 2 week course of triamcinolone application topical.

## 2021-09-10 ENCOUNTER — Other Ambulatory Visit: Payer: Self-pay | Admitting: Family Medicine

## 2021-09-10 DIAGNOSIS — Z1231 Encounter for screening mammogram for malignant neoplasm of breast: Secondary | ICD-10-CM

## 2021-09-16 ENCOUNTER — Other Ambulatory Visit: Payer: Self-pay

## 2021-09-16 ENCOUNTER — Ambulatory Visit
Admission: RE | Admit: 2021-09-16 | Discharge: 2021-09-16 | Disposition: A | Discharge status: Home / Self Care | Payer: No Typology Code available for payment source | Source: Ambulatory Visit | Attending: Family Medicine | Admitting: Family Medicine

## 2021-09-16 DIAGNOSIS — Z1231 Encounter for screening mammogram for malignant neoplasm of breast: Secondary | ICD-10-CM

## 2021-09-18 ENCOUNTER — Encounter: Payer: Self-pay | Admitting: Registered Nurse

## 2021-09-18 ENCOUNTER — Ambulatory Visit: Payer: Self-pay | Admitting: Registered Nurse

## 2021-09-18 ENCOUNTER — Other Ambulatory Visit: Payer: Self-pay

## 2021-09-18 VITALS — BP 117/83 | HR 68 | Temp 98.3°F

## 2021-09-18 DIAGNOSIS — S76312A Strain of muscle, fascia and tendon of the posterior muscle group at thigh level, left thigh, initial encounter: Secondary | ICD-10-CM

## 2021-09-18 DIAGNOSIS — M79672 Pain in left foot: Secondary | ICD-10-CM

## 2021-09-18 DIAGNOSIS — E119 Type 2 diabetes mellitus without complications: Secondary | ICD-10-CM

## 2021-09-18 DIAGNOSIS — L309 Dermatitis, unspecified: Secondary | ICD-10-CM

## 2021-09-18 NOTE — Progress Notes (Signed)
Subjective:    Patient ID: Danielle Cooke, female    DOB: 03-28-1976, 46 y.o.   MRN: [id number]  45y/o Caucasian established female pt c/o dull ache to L lateral thigh. Noticed it first as a twinge, then worsened to only noticing when sitting down or movements from standing to sitting and reverse. Denied symptoms of blood clot like previously with redness, warmth, or swelling. Has noticed a twinge in R lateral thigh in past days too now. Had been flexed from [employer] to [employer] and back in [place] again for 90 days steps increased from [place] to 11K. Weight has plateaued at 50 lb loss.  She has been riding stationary bike for exercise/walking dog (frequent stops for dog to sniff).  She has changed from cake to fresh fruits and wondering if that is why due to net carb increase.  Rash left thigh returned, all body skin has been dry.  Typically showers in am.  Has been applying lotion to hands and reapplying as needed (more than once a day)  but not applying body lotion.  Saw diabetic neuropathy specialist with her mother last night wondering if her foot pain related to diabetic neuropathy.  Pain midfoot bilaterally worsens as work day progresses-top of her foot.  Less so now since she bought new shoes but still occurring.  Denied rash/swelling/tingling.    Review of Systems  Constitutional:  Positive for activity change. Negative for appetite change, chills, diaphoresis, fatigue and fever.  HENT:  Negative for trouble swallowing and voice change.   Eyes:  Negative for photophobia and visual disturbance.  Respiratory:  Negative for cough, choking, shortness of breath, wheezing and stridor.   Cardiovascular:  Negative for chest pain.  Gastrointestinal:  Negative for diarrhea, nausea and vomiting.  Endocrine: Negative for cold intolerance and heat intolerance.  Genitourinary:  Negative for difficulty urinating.  Musculoskeletal:  Positive for back pain and myalgias. Negative for  arthralgias, gait problem, joint swelling, neck pain and neck stiffness.  Skin:  Positive for rash.  Allergic/Immunologic: Negative for food allergies.  Neurological:  Negative for dizziness, tremors, seizures, syncope, facial asymmetry, speech difficulty, weakness, light-headedness and headaches.  Psychiatric/Behavioral:  Negative for agitation, confusion and sleep disturbance.       Objective:   Physical Exam Vitals and nursing note reviewed.  Constitutional:      General: She is awake. She is not in acute distress.    Appearance: Normal appearance. She is well-developed and well-groomed. She is obese. She is not ill-appearing, toxic-appearing or diaphoretic.  HENT:     Head: Normocephalic and atraumatic.     Jaw: There is normal jaw occlusion.     Salivary Glands: Right salivary gland is not diffusely enlarged. Left salivary gland is not diffusely enlarged.     Right Ear: Hearing and external ear normal.     Left Ear: Hearing and external ear normal.     Nose: Nose normal. No congestion or rhinorrhea.     Mouth/Throat:     Lips: Pink. No lesions.     Mouth: Mucous membranes are moist.     Pharynx: Oropharynx is clear.  Eyes:     General: Lids are normal. Vision grossly intact. Gaze aligned appropriately. Allergic shiner present. No scleral icterus.       Right eye: No discharge.        Left eye: No discharge.     Extraocular Movements: Extraocular movements intact.     Conjunctiva/sclera: Conjunctivae normal.     Pupils:  Pupils are equal, round, and reactive to light.  Neck:     Trachea: Trachea and phonation normal. No tracheal deviation.  Cardiovascular:     Rate and Rhythm: Normal rate and regular rhythm.     Pulses: Normal pulses.          Radial pulses are 2+ on the right side and 2+ on the left side.       Dorsalis pedis pulses are 2+ on the left side.  Pulmonary:     Effort: Pulmonary effort is normal. No respiratory distress.     Breath sounds: Normal breath sounds  and air entry. No stridor or transmitted upper airway sounds. No wheezing, rhonchi or rales.     Comments: Spoke full sentences without difficulty; no cough observed in exam room Abdominal:     Palpations: Abdomen is soft.  Musculoskeletal:        General: Tenderness present. No swelling or deformity. Normal range of motion.     Cervical back: Normal range of motion and neck supple. No edema, erythema, signs of trauma, rigidity, torticollis, tenderness or crepitus. No pain with movement. Normal range of motion.     Right upper leg: No swelling, edema, deformity, lacerations, tenderness or bony tenderness.     Left upper leg: Tenderness present. No swelling, edema, deformity, lacerations or bony tenderness.     Right lower leg: No edema.     Left lower leg: No edema.     Right ankle: No swelling, deformity, ecchymosis or lacerations. Normal range of motion.     Left ankle: No swelling, deformity, ecchymosis or lacerations. Normal range of motion.     Right foot: Normal range of motion.     Left foot: Normal range of motion.       Legs:     Comments: Lateral proximal hamstring TTP  Feet:     Right foot:     Skin integrity: No ulcer, blister, skin breakdown, erythema, warmth, callus, dry skin or fissure.     Toenail Condition: Right toenails are normal.     Comments: Left great toe alignment medial towards 2nd; ? Bunion, MCP not prominent but patient gets discomfort relief with pulling toe laterally; patient wearing slip on shoes with canvas rubber sole Lymphadenopathy:     Head:     Right side of head: No submandibular or preauricular adenopathy.     Left side of head: No submandibular or preauricular adenopathy.     Cervical: No cervical adenopathy.     Right cervical: No superficial cervical adenopathy.    Left cervical: No superficial cervical adenopathy.  Skin:    General: Skin is warm and dry.     Capillary Refill: Capillary refill takes less than 2 seconds.     Coloration: Skin  is not ashen, cyanotic, jaundiced, mottled, pale or sallow.     Findings: Rash present. No abrasion, bruising, burn, erythema, signs of injury, laceration, lesion, petechiae or wound.     Nails: There is no clubbing.       Neurological:     General: No focal deficit present.     Mental Status: She is alert and oriented to person, place, and time. Mental status is at baseline.     GCS: GCS eye subscore is 4. GCS verbal subscore is 5. GCS motor subscore is 6.     Cranial Nerves: Cranial nerves 2-12 are intact. No cranial nerve deficit.     Sensory: Sensation is intact.     Motor:  Motor function is intact. No weakness, tremor, atrophy, abnormal muscle tone or seizure activity.     Coordination: Coordination is intact. Coordination normal.     Gait: Gait is intact. Gait normal.     Comments: In/out of chair and on/off exam table without difficulty; gait sure and steady in clinic; bilateral hand grasp equal 5/5  Psychiatric:        Attention and Perception: Attention and perception normal.        Mood and Affect: Mood and affect normal.        Speech: Speech normal.        Behavior: Behavior normal. Behavior is cooperative.        Thought Content: Thought content normal.        Cognition and Memory: Cognition and memory normal.        Judgment: Judgment normal.         Assessment & Plan:  A-left hamstring strain initial visit; left foot pain initial visit, type 2 diabetes mellitus with Hgba1c less than 7, dermatitis lower extremity  P-Discussed bunion/plantar fasciitis symptoms.  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  Discussed pillow between legs/or folded towel to give padding between feet and feet/mattress putting pressure on great toe may be exacerbating foot symptoms with side sleeping.  Discussed good arch support needed in shoes and cushioned sole if standing all day.   Discussed toenail cutting straight across not rounding/angling edges.  Demonstrated Hamstring strain excercises, consider compression garment/shorts, stretching daily, and working on core/leg strengthening, gastrocnemius stretches.  Discussed seated weights 1-2 lbs bicep curls, shoulder press, stop cycling for 1-2 weeks to allow hamstring to heal then restart 5 minutes per day and the following week increase to 6 minutes; may continue walking dog [employer] handout printed and given on hamstring strain/rehab exercises to patient.  Patient to follow up in 2 weeks or sooner if new or worsening symptoms e.g. swelling/worsening pain.  May use biofreeze topical QID prn pain.  Ice 15 minutes QID.  Tylenol 1000mg  po q6h prn pain.  Patient verbalized understanding information/instructions, agreed with plan of care and had no further questions at this time.  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  Ensure wearing supportive/cushioned/raised heel shoes that are not pinching toes or narrow toe box.  Consider podiatry referral if no improvement with plan of care e.g. ice/stretches/tylenol.  Patient verbalized understanding information/instructions, agreed with plan of care and had no further questions at this time.  Discussed intermittent activity versus long duration activity equal regarding health benefits as long as total exercise time the same per current research.  [facility] Well labs scheduled for February with RN Hildred Alamin fasting.  Recheck Hgba1c as last Oct 2022.  Continue hydrating with water.  Making healthy dietary choices and keeping added sugars less than 25 grams per day.  Discussed portion sizes fruits/vegetables.  Patient verbalized understanding information and had no further questions at this time.  May continue triamcinolone topical BID but also use  emollient after showering to lock in moisture.  Discussed dry skin more prone to rashes/itching also in the winter.  Patient to follow up if rash does not improve/resolve with plan of care.  Patient verbalized understanding information/instructions, agreed with plan of care and had no further  questions at this time.

## 2021-09-18 NOTE — Patient Instructions (Signed)
Hamstring Strain Rehab Ask your health care provider which exercises are safe for you. Do exercises exactly as told by your health care provider and adjust them as directed. It is normal to feel mild stretching, pulling, tightness, or discomfort as you do these exercises. Stop right away if you feel sudden pain or your pain gets worse. Do not begin these exercises until told by your health care provider. Stretching and range-of-motion exercises These exercises warm up your muscles and joints and improve the movement and flexibility of your thighs. These exercises also help to relieve pain, numbness, and tingling. Talk to your health care provider about these restrictions. Knee extension, seated  Sit with your left / right heel propped on a chair, a coffee table, or a footstool. Do not have anything under your knee to support it. Allow your leg muscles to relax, letting gravity straighten out your knee (extension). You should feel a stretch behind your left / right knee. If told by your health care provider, deepen the stretch by placing a ____1lb______ weight on your thigh, just above your kneecap. Hold this position for ___5-15_______ seconds. Repeat ____3______ times. Complete this exercise ____2_____ times a day. Seated stretch This exercise is sometimes called hamstrings and adductors stretch. Sit on the floor with your legs stretched wide. Keep your knees straight during this exercise. Keeping your head and back in a straight line, bend at your waist to reach for your left foot. You should feel a stretch in your right inner thigh (adductors). Hold this position for ___5-15_______ seconds. Then slowly return to the upright position. Keeping your head and back in a straight line, bend at your waist to reach forward. You should feel a stretch behind both of your thighs or knees (hamstrings). Hold this position for ___5-15_______ seconds. Then slowly return to the upright position. Keeping your  head and back in a straight line, bend at your waist to reach for your right foot. You should feel a stretch in your left inner thigh (adductors). Hold this position for ____5-15______ seconds. Then slowly return to the upright position. Repeat ____3______ times. Complete this exercise ___2______ times a day. Hamstrings stretch, supine  Lie on your back (supine position). Loop a belt or towel over the ball of your left / right foot. The ball of your foot is on the walking surface, right under your toes. Straighten your left / right knee and slowly pull on the belt or towel to raise your leg. Do not let your left / right knee bend while you do this. Keep your other leg flat on the floor. Raise the left / right leg until you feel a gentle stretch behind your left / right knee or thigh (hamstrings). Hold this position for _____5-15_____ seconds. Slowly return your leg to the starting position. Repeat ____3______ times. Complete this exercise __2_______ times a day. Strengthening exercises These exercises build strength and endurance in your thighs. Endurance is the ability to use your muscles for a long time, even after they get tired. Straight leg raises, prone This exercise strengthens the muscles that move the hips (hip extensors). Lie on your abdomen on a firm surface (prone position). Tense the muscles in your buttocks and lift your left / right leg about 4 inches (10 cm). Keep your knee straight as you lift your leg. If you cannot lift your leg that high without arching your back, place a pillow under your hips. Hold the position for ____5-15______ seconds. Slowly lower your leg to  the starting position. Allow your muscles to relax completely before you start the next repetition. Repeat _____3_____ times. Complete this exercise ____2______ times a day. Bridge This exercise strengthens the muscles in your buttocks and the back of your thighs (hip extensors). Lie on your back on a firm  surface with your knees bent and your feet flat on the floor. Tighten your buttocks muscles and lift your bottom off the floor until the trunk of your body is level with your thighs. You should feel the muscles working in your buttocks and the back of your thighs. Do not arch your back. Hold this position for ___5-15_______ seconds. Slowly lower your hips to the starting position. Let your buttocks muscles relax completely between repetitions. If told by your health care provider, keep your bottom lifted off the floor while you slowly walk your feet away from you as far as you can control. Hold for ____5-15_____ seconds, then slowly walk your feet back toward you. Repeat _____3_____ times. Complete this exercise ____2______ times a day. Lateral walking with band This is an exercise in which you walk sideways (lateral), with tension provided by an exercise band. The exercise strengthens the muscles in your hip (hip abductors). Stand in a long hallway. Wrap a loop of exercise band around your legs, just above your knees. Bend your knees gently and drop your hips down and back so your weight is over your heels. Step to the side to move down the length of the hallway, keeping your toes pointed ahead of you and keeping tension in the band. Repeat, leading with your other leg. Repeat _____3_____ times. Complete this exercise ____2______ times a day. Single leg stand with reaching This exercise is also called eccentric hamstring stretch. Stand on your left / right foot. Keep your big toe down on the floor and try to keep your arch lifted. Slowly reach down toward the floor as far as you can while keeping your balance. Lowering your thigh under tension is called eccentric stretching. Hold this position for ____5-15______ seconds. Repeat ____3______ times. Complete this exercise _____2_____ times a day. Plank, prone This exercise strengthens muscles in your abdomen and core area. Lie on your abdomen  on the floor (prone position),and prop yourself up on your elbows. Your hands should be straight out in front of you, and your elbows should be below your shoulders. Position your feet similar to a push-up position so your toes are on the ground. Tighten your abdominal muscles and lift your body off the floor. Do not arch your back. Do not hold your breath. Hold this position for _____5-15_____ seconds. Repeat ____3______ times. Complete this exercise ____2______ times a day. This information is not intended to replace advice given to you by your health care provider. Make sure you discuss any questions you have with your health care provider. Document Revised: 02/03/2021 Document Reviewed: 02/03/2021 Elsevier Patient Education  2022 [employer] Hamstring Strain A hamstring strain happens when the muscles in the back of the thighs (hamstring muscles) are overstretched or torn. The hamstring muscles are used in straightening the hips, bending the knees, and pulling back the legs. This injury is often called a pulled hamstring muscle. The tissue that connects the muscle to a bone (tendon) may also be affected. The severity of a hamstring strain may be rated in degrees or grades. First-degree (or grade 1) strains have the least amount of muscle tearing and pain. Second-degree and third-degree (grade 2 and 3) strains have increasingly more tearing  and pain. What are the causes? This condition is caused by a sudden, violent force being placed on the hamstring muscles, stretching them too far. This often happens during activities that involve sprinting, jumping, kicking, or weight lifting. What increases the risk? Hamstring strains are especially common in athletes. The following factors may also make you more likely to develop this condition: Having low strength, endurance, or flexibility of the hamstring muscles. Doing high-impact physical activity or sports. Having poor physical fitness. Having a  previous leg injury. Having tired (fatigued) muscles. Having a previous hamstring strain. What are the signs or symptoms? Symptoms of this condition include: Pain in the back of the thigh or buttocks. Swelling. Bruising. Muscle spasms. Trouble moving the affected muscle because of pain. For severe strains, you may feel popping or snapping in the back of your thigh when the injury occurs. How is this diagnosed? This condition is diagnosed based on your symptoms, your medical history, and a physical exam. You may also have imaging tests, such as an MRI or X-rays. Your strain may be rated based on how severe it is. The ratings are: Grade 1 strain (mild). Muscles are overstretched. There may be very small muscle tears. Grade 2 strain (moderate). Muscles are partially torn. Grade 3 strain (severe). Muscles are completely torn. How is this treated? Treatment for this condition usually involves: Protecting, resting, icing, applying compression, and elevating the injured area (PRICE therapy). Medicines. Your health care provider may recommend medicines to help reduce pain or inflammation. Doing exercises to regain strength and flexibility in the muscles. Your health care provider will tell you when it is okay to begin exercising. Hamstring strains may take a long time to heal. This type of strain may happen again in athletes. Follow your health care provider's advice about when to return to sports-related activities. Follow these instructions at home: PRICE therapy Use PRICE therapy to promote muscle healing during the first 2-3 days after your injury, or as told by your health care provider. Protect the muscle from being injured again. Rest your injury. This usually involves limiting your normal activities and not using the injured hamstring muscle. Talk with your health care provider about how you should limit your activities. Apply ice to the injured area: Put ice in a plastic bag. Place a  towel between your skin and the bag. Leave the ice on for 20 minutes, 2-3 times a day. After the third day, switch to applying heat as told. Put pressure (compression) on your injured hamstring by wrapping it with an elastic bandage. Be careful not to wrap it too tightly. That may interfere with blood circulation or may increase swelling. Raise (elevate) your injured hamstring above the level of your heart as often as possible. When you are lying down, you can do this by putting a pillow under your thigh.  Activity Begin exercising or stretching only as told by your health care provider. Do not return to full activity level until your health care provider approves. To help prevent muscle strains in the future, always warm up before exercising and stretch afterward. General instructions Take over-the-counter and prescription medicines only as told by your health care provider. If directed, apply heat to the affected area as often as told by your health care provider. Use the heat source that your health care provider recommends, such as a moist heat pack or a heating pad. Place a towel between your skin and the heat source. Leave the heat on for 20-30 minutes. Remove  the heat if your skin turns bright red. This is especially important if you are unable to feel pain, heat, or cold. You may have a greater risk of getting burned. Keep all follow-up visits. This is important. Contact a health care provider if: You have increasing pain or swelling in the injured area. You have numbness, tingling, or a significant loss of strength in the injured area. Get help right away if: Your foot or your toes become cold or turn blue. Summary A hamstring strain happens when the muscles in the back of the thighs (hamstring muscles) are overstretched or torn. This injury can be caused by a sudden, violent force being placed on the hamstring muscles, causing them to stretch too far. Symptoms include pain,  swelling, and muscle spasms in the injured area. Treatment includes PRICE therapy: protecting, resting, icing, applying compression, and elevating the injured area. This information is not intended to replace advice given to you by your health care provider. Make sure you discuss any questions you have with your health care provider. Document Revised: 01/09/2021 Document Reviewed: 01/09/2021 Elsevier Patient Education  2022 [employer] Bunion A bunion (hallux valgus) is a bump that forms slowly on the inner side of the big toe joint. It occurs when the big toe turns toward the second toe. Bunions may be small at first, but they often get larger over time. They can make walking painful. What are the causes? This condition may be caused by: Wearing narrow or pointed shoes that force the big toe to press against the other toes. Abnormal foot development that causes the foot to roll inward. Changes in the foot that are caused by certain diseases, such as rheumatoid arthritis or polio. A foot injury. What increases the risk? The following factors may make you more likely to develop this condition: Wearing shoes that squeeze the toes together. Having certain diseases, such as: Rheumatoid arthritis. Polio. Cerebral palsy. Having family members who have bunions. Being born with abnormally shaped feet (a foot deformity), such as flat feet or low arches. Doing activities that put a lot of pressure on the feet, such as ballet dancing. What are the signs or symptoms? The main symptom of this condition is a bump on your big toe that you can notice. Other symptoms may include: Pain. Redness and inflammation around your big toe. Thick or hardened skin on your big toe or between your toes. Stiffness or loss of motion in your big toe. Trouble with walking. How is this diagnosed? This condition may be diagnosed based on your symptoms, medical history, and activities. You may also have tests and  imaging, such as: X-rays. These allow your health care provider to check the position of the bones in your foot and look for damage to your joint. They also help your health care provider determine the severity of your bunion and the best way to treat it. Joint aspiration. In this test, a sample of fluid is removed from the toe joint. This test may be done if you are in a lot of pain. It helps rule out diseases that cause painful swelling of the joints, such as arthritis or gout. How is this treated? Treatment depends on the severity of your symptoms. The goal of treatment is to relieve symptoms and prevent your bunion from getting worse. Your health care provider may recommend: Wearing shoes that have a wide toe box, or using bunion pads to cushion the affected area. Taping your toes together to keep them in  a normal position. Placing a device inside your shoe (orthotic device) to help reduce pressure on your toe joint. Taking medicine to ease pain and inflammation. Putting ice or heat on the affected area. Doing stretching exercises. Surgery, for severe cases. Follow these instructions at home: Managing pain, stiffness, and swelling   If directed, put ice on the painful area. To do this: Put ice in a plastic bag. Place a towel between your skin and the bag. Leave the ice on for 20 minutes, 2-3 times a day. Remove the ice if your skin turns bright red. This is very important. If you cannot feel pain, heat, or cold, you have a greater risk of damage to the area. If directed, apply heat to the affected area before you exercise. Use the heat source that your health care provider recommends, such as a moist heat pack or a heating pad. Place a towel between your skin and the heat source. Leave the heat on for 20-30 minutes. Remove the heat if your skin turns bright red. This is especially important if you are unable to feel pain, heat, or cold. You have a greater risk of getting burned. General  instructions Do exercises as told by your health care provider. Support your toe joint with proper footwear, shoe padding, or taping as told by your health care provider. Take over-the-counter and prescription medicines only as told by your health care provider. Do not use any products that contain nicotine or tobacco, such as cigarettes, e-cigarettes, and chewing tobacco. If you need help quitting, ask your health care provider. Keep all follow-up visits. This is important. Contact a health care provider if: Your symptoms get worse. Your symptoms do not improve in 2 weeks. Get help right away if: You have severe pain and trouble with walking. Summary A bunion is a bump on the inner side of the big toe joint that forms when the big toe turns toward the second toe. Bunions can make walking painful. Treatment depends on the severity of your symptoms. Support your toe joint with proper footwear, shoe padding, or taping as told by your health care provider. This information is not intended to replace advice given to you by your health care provider. Make sure you discuss any questions you have with your health care provider. Document Revised: 12/15/2019 Document Reviewed: 12/15/2019 Elsevier Patient Education  2022 [employer].

## 2021-09-19 ENCOUNTER — Other Ambulatory Visit: Payer: Self-pay | Admitting: Family Medicine

## 2021-09-19 DIAGNOSIS — R928 Other abnormal and inconclusive findings on diagnostic imaging of breast: Secondary | ICD-10-CM

## 2021-10-02 ENCOUNTER — Other Ambulatory Visit: Payer: Self-pay

## 2021-10-02 ENCOUNTER — Ambulatory Visit: Payer: Self-pay | Admitting: *Deleted

## 2021-10-02 VITALS — BP 102/74 | HR 63 | Ht 61.0 in | Wt 224.0 lb

## 2021-10-02 DIAGNOSIS — E119 Type 2 diabetes mellitus without complications: Secondary | ICD-10-CM

## 2021-10-02 DIAGNOSIS — Z0000 Encounter for general adult medical examination without abnormal findings: Secondary | ICD-10-CM

## 2021-10-02 DIAGNOSIS — E559 Vitamin D deficiency, unspecified: Secondary | ICD-10-CM

## 2021-10-02 DIAGNOSIS — E1165 Type 2 diabetes mellitus with hyperglycemia: Secondary | ICD-10-CM

## 2021-10-02 DIAGNOSIS — E785 Hyperlipidemia, unspecified: Secondary | ICD-10-CM

## 2021-10-02 NOTE — Progress Notes (Addendum)
[facility] insurance premium discount evaluation: Labs Drawn. Replacements ROI and Tobacco Free Attestation forms not yet available. Will have pt RTC once available to complete. Congratulated pt on her 52# wt loss over the past year!

## 2021-10-03 ENCOUNTER — Telehealth: Payer: Self-pay | Admitting: Registered Nurse

## 2021-10-03 ENCOUNTER — Encounter: Payer: Self-pay | Admitting: Registered Nurse

## 2021-10-03 DIAGNOSIS — R7989 Other specified abnormal findings of blood chemistry: Secondary | ICD-10-CM

## 2021-10-03 LAB — CMP12+LP+TP+TSH+6AC+CBC/D/PLT
ALT: 18 IU/L (ref 0–32)
AST: 14 IU/L (ref 0–40)
Albumin/Globulin Ratio: 1.8 (ref 1.2–2.2)
Albumin: 4.5 g/dL (ref 3.8–4.8)
Alkaline Phosphatase: 47 IU/L (ref 44–121)
BUN/Creatinine Ratio: 26 — ABNORMAL HIGH (ref 9–23)
BUN: 17 mg/dL (ref 6–24)
Basophils Absolute: 0.1 10*3/uL (ref 0.0–0.2)
Basos: 1 %
Bilirubin Total: 0.4 mg/dL (ref 0.0–1.2)
Calcium: 9.4 mg/dL (ref 8.7–10.2)
Chloride: 103 mmol/L (ref 96–106)
Chol/HDL Ratio: 4.7 ratio — ABNORMAL HIGH (ref 0.0–4.4)
Cholesterol, Total: 196 mg/dL (ref 100–199)
Creatinine, Ser: 0.66 mg/dL (ref 0.57–1.00)
EOS (ABSOLUTE): 0.2 10*3/uL (ref 0.0–0.4)
Eos: 3 %
Estimated CHD Risk: 1.2 times avg. — ABNORMAL HIGH (ref 0.0–1.0)
Free Thyroxine Index: 2 (ref 1.2–4.9)
GGT: 12 IU/L (ref 0–60)
Globulin, Total: 2.5 g/dL (ref 1.5–4.5)
Glucose: 103 mg/dL — ABNORMAL HIGH (ref 70–99)
HDL: 42 mg/dL (ref >39)
Hematocrit: 42.2 % (ref 34.0–46.6)
Hemoglobin: 13.6 g/dL (ref 11.1–15.9)
Immature Grans (Abs): 0 10*3/uL (ref 0.0–0.1)
Immature Granulocytes: 0 %
Iron: 95 ug/dL (ref 27–159)
LDH: 146 IU/L (ref 119–226)
LDL Chol Calc (NIH): 142 mg/dL — ABNORMAL HIGH (ref 0–99)
Lymphocytes Absolute: 1.7 10*3/uL (ref 0.7–3.1)
Lymphs: 33 %
MCH: 29.2 pg (ref 26.6–33.0)
MCHC: 32.2 g/dL (ref 31.5–35.7)
MCV: 91 fL (ref 79–97)
Monocytes Absolute: 0.5 10*3/uL (ref 0.1–0.9)
Monocytes: 10 %
Neutrophils Absolute: 2.7 10*3/uL (ref 1.4–7.0)
Neutrophils: 53 %
Phosphorus: 3.8 mg/dL (ref 3.0–4.3)
Platelets: 207 10*3/uL (ref 150–450)
Potassium: 4.3 mmol/L (ref 3.5–5.2)
RBC: 4.66 x10E6/uL (ref 3.77–5.28)
RDW: 13.1 % (ref 11.7–15.4)
Sodium: 141 mmol/L (ref 134–144)
T3 Uptake Ratio: 29 % (ref 24–39)
T4, Total: 6.9 ug/dL (ref 4.5–12.0)
TSH: 2.5 u[IU]/mL (ref 0.450–4.500)
Total Protein: 7 g/dL (ref 6.0–8.5)
Triglycerides: 63 mg/dL (ref 0–149)
Uric Acid: 4.9 mg/dL (ref 2.6–6.2)
VLDL Cholesterol Cal: 12 mg/dL (ref 5–40)
WBC: 5.2 10*3/uL (ref 3.4–10.8)
eGFR: 108 mL/min/{1.73_m2} (ref >59)

## 2021-10-03 LAB — HGB A1C W/O EAG: Hgb A1c MFr Bld: 6 % — ABNORMAL HIGH (ref 4.8–5.6)

## 2021-10-03 LAB — VITAMIN D 25 HYDROXY (VIT D DEFICIENCY, FRACTURES): Vit D, 25-Hydroxy: 42.5 ng/mL (ref 30.0–100.0)

## 2021-10-03 MED ORDER — CHOLECALCIFEROL 1.25 MG (50000 UT) PO TABS: 1.0000 | ORAL_TABLET | ORAL | 0 refills | Status: AC

## 2021-10-03 NOTE — Addendum Note (Signed)
Addended by: Albina Billet A on: 10/03/2021 11:59 AM   Modules accepted: Orders

## 2021-10-03 NOTE — Progress Notes (Signed)
Results reviewed with pt by phone. Congratulated on her wt loss and improvement in multiple areas of her health. Discussed food substitutes such as [place] sausage for pork and egg white with only one egg yolk, or full egg whites for breakfast as she feels her worsening cholesterol may be r/t to eggs and bacon or sausage daily.  No pcp currently. H?er provider moved offices and she does not plan to follow them. She will notify clinic when she decides on a new provider and we will send recent results prior to her first appt. She denies further questions or concerns.

## 2021-10-03 NOTE — Progress Notes (Signed)
Noted reviewed RN Haley note agreed with plan of care 

## 2021-10-03 NOTE — Telephone Encounter (Signed)
[facility] labs comp yesterday.  52 lb weight loss, BP normal now since weight loss after 2022 [facility] appt.  Repeat Hgba1c and Vitamin D level in 6 months nonfasting.  Continue 50,000 units by mouth weekly cholecalciferol for another 12 weeks and consider decreasing to 1000 units by mouth daily this summer.  Goal to serum Vitamin D less than 60 but above 30 year round.  May need to have higher supplementation in the winter months.  Discussed with RN Rolly Salter consider swapping regular eggs for egg whites and decreasing full fat dairy to lower fat dairy and increasing fiber to help with LDL.  Due to diabetes consider starting low dose statin.  Patient wants to try dietary modification at this time.

## 2021-10-09 ENCOUNTER — Ambulatory Visit
Admission: RE | Admit: 2021-10-09 | Discharge: 2021-10-09 | Disposition: A | Discharge status: Home / Self Care | Payer: No Typology Code available for payment source | Source: Ambulatory Visit | Attending: Family Medicine | Admitting: Family Medicine

## 2021-10-09 ENCOUNTER — Other Ambulatory Visit: Payer: Self-pay | Admitting: Family Medicine

## 2021-10-09 ENCOUNTER — Other Ambulatory Visit: Payer: Self-pay

## 2021-10-09 DIAGNOSIS — R928 Other abnormal and inconclusive findings on diagnostic imaging of breast: Secondary | ICD-10-CM

## 2021-10-23 ENCOUNTER — Telehealth: Payer: Self-pay | Admitting: Registered Nurse

## 2021-10-23 ENCOUNTER — Encounter: Payer: Self-pay | Admitting: Registered Nurse

## 2021-10-23 DIAGNOSIS — Z6841 Body Mass Index (BMI) 40.0 and over, adult: Secondary | ICD-10-CM

## 2021-10-23 NOTE — Telephone Encounter (Signed)
Patient asked questions about [facility] dietitian program and other things she can do to improve microbiome of gut and further her weight loss.  Patient stated has lost another 4 lbs but it has been more difficult to continue losing weight.  Her legs/feet are feeling better after weight loss and has noticed legs decreasing in size.  Has noticed some loose skin on arms.  Discussed yogurt with low added or no added sugars and active cultures, fermented foods such as pickles, asparagus, [place] mix (cauliflower, carrots, artichokes), kefir, kombucha, and kimchi.  Discussed diet high in sugar can promote yeast overgrowth in body.  Recommended continuing to add nonstarchy vegetables in diet.  Patient previously given ADA list/handout.  Patient emailed articles on fermented foods natural versus vinegar based or canned high pressure from [facility] and healthline.  Patient stated she will contact [facility] dietitian and try to incorporate some of the naturally fermented foods discussed today into her diet.  Patient verbalized understanding information/instructions, agreed with plan of care and had no further questions at this time. ?

## 2021-11-18 ENCOUNTER — Ambulatory Visit: Payer: Self-pay | Admitting: Registered Nurse

## 2021-11-18 ENCOUNTER — Encounter: Payer: Self-pay | Admitting: Registered Nurse

## 2021-11-18 VITALS — BP 121/85 | HR 56 | Temp 98.4°F

## 2021-11-18 DIAGNOSIS — R42 Dizziness and giddiness: Secondary | ICD-10-CM

## 2021-11-18 DIAGNOSIS — L309 Dermatitis, unspecified: Secondary | ICD-10-CM

## 2021-11-18 NOTE — Progress Notes (Signed)
? ?Subjective:  ? ? Patient ID: Danielle Cooke, female    DOB: [birth year], 46 y.o.   MRN: [id number] ? ?46y/o caucasian single established female brought to clinic by supervisor for evaluation after dizziness at workstation.   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 Head-down repetitively not a usual positioning for her.  She began having dizziness and felt lightheaded. HA in back of head. Has not sat down since symptoms started as supervisor walked her down to clinic. Typically drinks 16oz coffee then transitions to water for remainder of day. She has not had any water yet today, only coffee. Breakfast before 0900 was [place] sausage and one egg.   Am blood glucose today fasting 106 at home.  Denied n/v/d/fever/chills.  Thigh red itchy rash started again 10 days ago also, started reapplying steroid cream and improving.  Patient has not been applying emollient after shower daily.  Patient frustrated her weight loss efforts have stalled.  Denied regaining any weight but hasn't lost any the past couple of weeks.  Trying to increase activity and modify diet further to increase vegetables/fiber/fermented foods/water. ? ? ? ? ?Review of Systems  ?Constitutional:  Positive for activity change. Negative for appetite change, chills, diaphoresis, fatigue and fever.  ?HENT:  Negative for trouble swallowing and voice change.   ?Eyes:  Negative for photophobia and visual disturbance.  ?Respiratory:  Negative for cough, choking, shortness of breath, wheezing and stridor.   ?Cardiovascular:  Negative for chest pain and palpitations.  ?Gastrointestinal:  Negative for diarrhea, nausea and vomiting.  ?Endocrine: Negative for cold intolerance and heat intolerance.  ?Genitourinary:  Negative for difficulty urinating.  ?Musculoskeletal:  Negative for back pain, gait problem, myalgias, neck pain and neck stiffness.  ?Skin:  Positive for rash. Negative for pallor and wound.   ?Allergic/Immunologic: Positive for environmental allergies. Negative for food allergies.  ?Neurological:  Positive for dizziness, light-headedness and headaches. Negative for tremors, seizures, syncope, facial asymmetry, speech difficulty, weakness and numbness.  ?Hematological:  Negative for adenopathy. Does not bruise/bleed easily.  ?Psychiatric/Behavioral:  Negative for agitation, confusion and sleep disturbance.   ? ?   ?Objective:  ? Physical Exam ?Vitals and nursing note reviewed.  ?Constitutional:   ?   General: She is awake. She is not in acute distress. ?   Appearance: Normal appearance. She is well-developed and well-groomed. She is obese. She is not ill-appearing, toxic-appearing or diaphoretic.  ?HENT:  ?   Head: Normocephalic and atraumatic.  ?   Jaw: There is normal jaw occlusion.  ?   Salivary Glands: Right salivary gland is not diffusely enlarged or tender. Left salivary gland is not diffusely enlarged or tender.  ?   Right Ear: Hearing and external ear normal.  ?   Left Ear: Hearing and external ear normal.  ?   Nose: Nose normal. No congestion or rhinorrhea.  ?   Mouth/Throat:  ?   Lips: Pink. No lesions.  ?   Mouth: Mucous membranes are moist. No angioedema.  ?   Tongue: No lesions. Tongue does not deviate from midline.  ?   Palate: No mass and lesions.  ?   Pharynx: Oropharynx is clear. Uvula midline.  ?Eyes:  ?   General: Lids are normal. Vision grossly intact. Gaze aligned appropriately. Allergic shiner present. No scleral icterus.    ?   Right eye: No discharge.     ?   Left eye: No discharge.  ?  Extraocular Movements: Extraocular movements intact.  ?   Conjunctiva/sclera: Conjunctivae normal.  ?   Pupils: Pupils are equal, round, and reactive to light.  ?Neck:  ?   Trachea: Trachea and phonation normal. No tracheal deviation.  ?Cardiovascular:  ?   Rate and Rhythm: Normal rate and regular rhythm.  ?   Pulses:     ?     Radial pulses are 1+ on the right side and 1+ on the left side.  ?    Comments: Rechecked pulses after patient sat 15 minutes and sipped 12 oz bottle of water in clinic and pulses 2+/4 radially-66 bpm ?Pulmonary:  ?   Effort: Pulmonary effort is normal. No respiratory distress.  ?   Breath sounds: Normal breath sounds and air entry. No stridor or transmitted upper airway sounds. No wheezing.  ?   Comments: Spoke full sentences without difficulty; no cough observed in exam room ?Abdominal:  ?   General: Abdomen is flat.  ?Musculoskeletal:     ?   General: Normal range of motion.  ?   Right hand: Normal strength. Normal capillary refill.  ?   Left hand: Normal strength. Normal capillary refill.  ?   Cervical back: Normal range of motion and neck supple. No swelling, edema, deformity, erythema, signs of trauma, lacerations, rigidity, tenderness or crepitus. No pain with movement. Normal range of motion.  ?   Right lower leg: No edema.  ?   Left lower leg: No edema.  ?Lymphadenopathy:  ?   Head:  ?   Right side of head: No submandibular or preauricular adenopathy.  ?   Left side of head: No submandibular or preauricular adenopathy.  ?   Cervical: No cervical adenopathy.  ?   Right cervical: No superficial cervical adenopathy. ?   Left cervical: No superficial cervical adenopathy.  ?Skin: ?   General: Skin is warm and dry.  ?   Capillary Refill: Capillary refill takes less than 2 seconds.  ?   Coloration: Skin is not ashen, cyanotic, jaundiced, mottled, pale or sallow.  ?   Findings: No abrasion, bruising, burn, erythema, signs of injury, laceration, petechiae or wound.  ?   Nails: There is no clubbing.  ?Neurological:  ?   General: No focal deficit present.  ?   Mental Status: She is alert and oriented to person, place, and time. Mental status is at baseline.  ?   GCS: GCS eye subscore is 4. GCS verbal subscore is 5. GCS motor subscore is 6.  ?   Cranial Nerves: Cranial nerves 2-12 are intact. No cranial nerve deficit, dysarthria or facial asymmetry.  ?   Motor: Motor function is intact.  No weakness, tremor, atrophy, abnormal muscle tone or seizure activity.  ?   Coordination: Coordination is intact. Coordination normal.  ?   Gait: Gait is intact. Gait normal.  ?   Comments: In/out of chair without difficulty; gait sure and steady in clinic; bilateral hand grasp equal 5/5  ?Psychiatric:     ?   Attention and Perception: Attention and perception normal.     ?   Mood and Affect: Mood and affect normal.     ?   Speech: Speech normal.     ?   Behavior: Behavior normal. Behavior is cooperative.     ?   Thought Content: Thought content normal.     ?   Cognition and Memory: Cognition and memory normal.     ?   Judgment: Judgment  normal.  ? ? ? ? ? ?   ?Assessment & Plan:  ? ?A-dizziness on standing, dermatitis of lower extremity, BMI 42 ? ?P-Discussed with patient most likely mild dehydration after coffee this am and had not started drinking any water since symptoms resolved after sitting in clinic and drinking 12oz water.  Than head down and up position repeatedly chasing termites (fight/flight response also kicked in).  Continue drinking water, do not delay lunch today as scheduled between 2-3pm.  Consider snack when she returns to workstation "I have banana in my lunch box"  I agreed with plan.   Consider kneeling/squatting if needs to do more floor cleaning today.   Return to clinic if symptoms re-occur for POCT glucose/repeat BP with RN Rolly SalterHaley.  If any chest pain/palpitations to go to ER as no EKG available at [facility].  Exitcare handout on dizziness  Patient verbalized understanding information/instructions, agreed with plan of care and had no further questions at this time. ? ?Patient not applying emollient (vaseline/aquaphor/eucerin) after bathing daily and the past 2 weeks have had freezing temperatures at night/heater running more often in between 80 degree temp days and low humidity dries out skin.  Reemphasized emollient daily until humid summer days back as dry skin more easily  irritated by rubbing clothing also. Avoid hot steamy showers.   Patient verbalized understanding information/instructions, agreed with plan of care and had no further questions at this time. ? ?Congratulated patient on su

## 2021-11-18 NOTE — Patient Instructions (Signed)
Dizziness Dizziness is a common problem. It is a feeling of unsteadiness or light-headedness. You may feel like you are about to faint. Dizziness can lead to injury if you stumble or fall. Anyone can become dizzy, but dizziness is more common in older adults. This condition can be caused by a number of things, including medicines, dehydration, or illness. Follow these instructions at home: Eating and drinking  Drink enough fluid to keep your urine pale yellow. This helps to keep you from becoming dehydrated. Try to drink more clear fluids, such as water. Do not drink alcohol. Limit your caffeine intake if told to do so by your health care provider. Check ingredients and nutrition facts to see if a food or beverage contains caffeine. Limit your salt (sodium) intake if told to do so by your health care provider. Check ingredients and nutrition facts to see if a food or beverage contains sodium. Activity  Avoid making quick movements. Rise slowly from chairs and steady yourself until you feel okay. In the morning, first sit up on the side of the bed. When you feel okay, stand slowly while you hold onto something until you know that your balance is good. If you need to stand in one place for a long time, move your legs often. Tighten and relax the muscles in your legs while you are standing. Do not drive or use machinery if you feel dizzy. Avoid bending down if you feel dizzy. Place items in your home so that they are easy for you to reach without leaning over. Lifestyle Do not use any products that contain nicotine or tobacco. These products include cigarettes, chewing tobacco, and vaping devices, such as e-cigarettes. If you need help quitting, ask your health care provider. Try to reduce your stress level by using methods such as yoga or meditation. Talk with your health care provider if you need help to manage your stress. General instructions Watch your dizziness for any changes. Take  over-the-counter and prescription medicines only as told by your health care provider. Talk with your health care provider if you think that your dizziness is caused by a medicine that you are taking. Tell a friend or a family member that you are feeling dizzy. If he or she notices any changes in your behavior, have this person call your health care provider. Keep all follow-up visits. This is important. Contact a health care provider if: Your dizziness does not go away or you have new symptoms. Your dizziness or light-headedness gets worse. You feel nauseous. You have reduced hearing. You have a fever. You have neck pain or a stiff neck. Your dizziness leads to an injury or a fall. Get help right away if: You vomit or have diarrhea and are unable to eat or drink anything. You have problems talking, walking, swallowing, or using your arms, hands, or legs. You feel generally weak. You have any bleeding. You are not thinking clearly or you have trouble forming sentences. It may take a friend or family member to notice this. You have chest pain, abdominal pain, shortness of breath, or sweating. Your vision changes or you develop a severe headache. These symptoms may represent a serious problem that is an emergency. Do not wait to see if the symptoms will go away. Get medical help right away. Call your local emergency services (911 in the [place] Do not drive yourself to the hospital. Summary Dizziness is a feeling of unsteadiness or light-headedness. This condition can be caused by a number of   things, including medicines, dehydration, or illness. Anyone can become dizzy, but dizziness is more common in older adults. Drink enough fluid to keep your urine pale yellow. Do not drink alcohol. Avoid making quick movements if you feel dizzy. Monitor your dizziness for any changes. This information is not intended to replace advice given to you by your health care provider. Make sure you discuss any  questions you have with your health care provider. Document Revised: 07/15/2020 Document Reviewed: 07/15/2020 Elsevier Patient Education  2022 [employer]vier Inc.  

## 2021-11-20 ENCOUNTER — Encounter: Payer: Self-pay | Admitting: Registered Nurse

## 2021-11-20 ENCOUNTER — Telehealth: Payer: Self-pay | Admitting: Registered Nurse

## 2021-11-20 NOTE — Telephone Encounter (Signed)
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  Went outside to speak with patient and she stated on menstrual cycle not feeling well and going home to rest.  Did not need appt or assistance from clinic staff at this time.  A&Ox3 spoke full sentences without difficulty gait sure and steady in parking lot skin warm dry and pink respirations even and unlabored RA.  Patient notified to contact clinic staff if she requires assistance/re-evaluation.  Patient verbalized understanding information/instructions, agreed with plan of care and had no further questions at this time. ?

## 2021-11-23 NOTE — Telephone Encounter (Signed)
Patient contacted via telephone stated went home, took her pain medication and feeling much better this weekend denied concerns or questions.  Plans to be at work as scheduled this week.  A&Ox3 spoke full sentences without difficulty no audible cough/congestion/throat clearing. ?

## 2021-12-23 ENCOUNTER — Telehealth: Payer: Self-pay | Admitting: Registered Nurse

## 2021-12-23 ENCOUNTER — Encounter: Payer: Self-pay | Admitting: Registered Nurse

## 2021-12-23 DIAGNOSIS — M62838 Other muscle spasm: Secondary | ICD-10-CM

## 2021-12-23 NOTE — Telephone Encounter (Signed)
Patient reported scapular pain this weekend denied trauma/injury.  "Had a lump there but gone now"  Demonstrated stretches/exercises after patient stated had been lifting/holding items at eye level for prolonged periods discussed muscle strain/spasm.  Use of racquetball/tennis ball or lacrosse ball to help massage area (self).  Eagle arms stretch/chest stretch/arm circles/pendulums.  Patient denied arm/hand weakness.  Patient reported leg rash had also resolved.  Exitcare handouts sent to patient my chart.  Patient arom bilateral shoulders/c/t-spine normal even bilaterally.  Bilateral hand grasp equal 5/5.  Skin warm dry and pink.  Gait sure and steady in workcenter.  A&Ox3.  Patient verbalized understanding information/instructions, agreed with plan of care and had no further questions at this time. ?

## 2022-01-07 ENCOUNTER — Telehealth: Payer: Self-pay | Admitting: Registered Nurse

## 2022-01-07 ENCOUNTER — Encounter: Payer: Self-pay | Admitting: Registered Nurse

## 2022-01-07 NOTE — Telephone Encounter (Signed)
Patient contacted and stated after performing exercises/stretches shoulder blade pain resolved.  Denied further questions or concerns at this time. Skin warm dry and pink  AROM back to baseline without pain.  A&Ox3 gait sure and steady in showroom. ?

## 2022-01-08 ENCOUNTER — Encounter: Payer: Self-pay | Admitting: Registered Nurse

## 2022-01-08 ENCOUNTER — Ambulatory Visit: Payer: Self-pay | Admitting: Registered Nurse

## 2022-01-08 VITALS — BP 115/90 | HR 70 | Temp 98.9°F

## 2022-01-08 DIAGNOSIS — T24201A Burn of second degree of unspecified site of right lower limb, except ankle and foot, initial encounter: Secondary | ICD-10-CM

## 2022-01-08 DIAGNOSIS — E119 Type 2 diabetes mellitus without complications: Secondary | ICD-10-CM

## 2022-01-08 DIAGNOSIS — E559 Vitamin D deficiency, unspecified: Secondary | ICD-10-CM

## 2022-01-08 MED ORDER — SILVER SULFADIAZINE 1 % EX CREA: 1.0000 "application " | TOPICAL_CREAM | Freq: Every day | CUTANEOUS | 0 refills | Status: DC

## 2022-01-08 NOTE — Patient Instructions (Signed)
Burn Care, Adult A burn is an injury to the skin or the tissues under the skin that is caused by a fire, hot liquid, chemical, or electricity. There are three types of burns: First degree. These burns may cause the skin to be red and slightly swollen. These burns do not blister or scar. Second degree. These burns are very painful and cause the skin to be very red. The skin may also swell, leak fluid, look shiny, and develop blisters. Third degree. These burns cause permanent damage. They either turn the skin white or black and make it look charred, dry, and leathery. These burns may not be painful due to damage to the nerve endings. Treatment for your burn will depend on the type of burn you have. Taking care of your burn properly can help to prevent pain and infection. It can also help the burn heal more quickly. How to care for a first-degree burn Right after a burn: Rinse or soak the burn under cool water for 5 minutes or more. Do not put ice on your burn. This can cause more damage. Apply a cool, clean, wet cloth (cool compress) to your skin. This may help with pain. Put lotion or gel with aloe vera on your skin. This may help soothe the burn. Caring for the burn Follow instructions from your health care provider about cleaning and caring for the burn. This may include: Using mild soap and water to clean the area. Using a clean cloth to pat the burned area dry after cleaning it. Do not rub or scrub the burn. Applying lotion or gel with aloe vera to your skin. How to care for a second-degree burn Right after a burn: Rinse or soak the burn under cool water. Do this for 5 to 10 minutes. Do not put ice on your burn. This can cause more damage. Remove any jewelry near the burned area. Lightly cover the burn with a clean cloth. Caring for the burn Raise (elevate) the injured area above the level of your heart while sitting or lying down. Follow instructions from your health care provider about  cleaning and caring for the burn. This may include: Cleaning or rinsing out (irrigating) the burned area. Putting a cream or ointment on the burn. Placing a germ-free (sterile) dressing over the burn. A dressing is a material that is placed over a burn to help it heal. How to care for a third-degree burn Right after a burn: Lightly cover the burn with a clean, dry cloth. Seek treatment right away if you have this kind of burn. You may: Require admission to the hospital. Be treated with surgery to remove damaged tissue or to place a skin graft to cover the damaged area. Be given IV fluids to keep you hydrated. Caring for the burn Follow instructions from your health care provider about cleaning and caring for the burn. This may include: Cleaning or rinsing out (irrigating) the burned area. Putting a cream or ointment on the burn. Placing a sterile dressing in the burned area (packing). Placing a sterile dressing over the burn. Other instructions Elevate the injured area above the level of your heart while sitting or lying down. Wear splints or immobilizers as instructed by your health care provider. Rest as told by your health care provider. Do not participate in sports or other physical activities until your health care provider approves. How to prevent infection when caring for a burn  Take these steps to prevent infection: Wash your hands with   soap and water for at least 20 seconds before and after burn care. If soap and water are not available, use hand sanitizer. Wear clean or sterile gloves as directed by your health care provider. Do not put butter, oil, toothpaste, or other home remedies on the burn. Do not scratch or pick at the burn. Do not break any blisters. Do not peel the skin. Do not rub your burn, even when you are cleaning it. Check your burn every day for these signs of infection: More redness, swelling, or pain. Warmth. Pus or a bad smell. Red streaks around the  burn. Follow these instructions at home  Medicines Take over-the-counter and prescription medicines only as told by your health care provider. If you were prescribed an antibiotic medicine, take or apply it as told by your health care provider. Do not stop using the antibiotic even if your condition improves. Your health care provider may recommend taking over-the-counter or prescription pain medicine before changing your dressing. General instructions Protect your burn from the sun. Drink enough fluid to keep your urine pale yellow. Do not use any products that contain nicotine or tobacco, such as cigarettes, e-cigarettes, and chewing tobacco. These can delay healing. If you need help quitting, ask your health care provider. Keep all follow-up visits as told by your health care provider. This is important. Contact a health care provider if: Your condition does not improve. Your condition gets worse. You have a fever or chills. Your burn feels warm to the touch. You have more redness, swelling, or pain at the site of the burn. Your burn changes in appearance or develops black or red spots. You have pain that is not controlled with medicine. Get help right away if you have: More fluid, blood, or pus coming from your burn. Red streaks near the burn. Severe pain. Summary There are three types of burns. They are first degree, second degree, and third degree. The most severe type of burn is a third-degree burn which must be treated right away. Treatment for your burn will depend on the type of burn you have. Do not put butter, oil, toothpaste, or other home remedies on the burn. This can cause more damage to the tissue. Follow instructions from your health care provider about how to clean and take care of your burn. This information is not intended to replace advice given to you by your health care provider. Make sure you discuss any questions you have with your health care  provider. Document Revised: 05/30/2019 Document Reviewed: 05/30/2019 Elsevier Patient Education  2023 [employer] Inc.  

## 2022-01-08 NOTE — Progress Notes (Signed)
Subjective:    Patient ID: Danielle Cooke, female    DOB: [birth year], 46 y.o.   MRN: [id number]  46y/o established single caucasian female pt c/o burn to R upper thigh occurred yesterday, lid popped off her coffee cup, it spilled and and hit her right leg.  Applied ice and tried to dab off coffee immediately but sustained burn.  Applied neosporin last night and washed area.  Sore today no discharge, red, tender right outer upper thigh.     Review of Systems  Constitutional:  Negative for activity change, appetite change, chills, diaphoresis, fatigue and fever.  HENT:  Negative for trouble swallowing and voice change.   Eyes:  Negative for photophobia and visual disturbance.  Respiratory:  Negative for cough, shortness of breath, wheezing and stridor.   Cardiovascular:  Negative for chest pain.  Gastrointestinal:  Negative for diarrhea, nausea and vomiting.  Endocrine: Negative for cold intolerance and heat intolerance.  Genitourinary:  Negative for difficulty urinating.  Musculoskeletal:  Negative for gait problem, myalgias, neck pain and neck stiffness.  Skin:  Positive for color change, rash and wound. Negative for pallor.  Allergic/Immunologic: Negative for food allergies.  Neurological:  Negative for dizziness, tremors, seizures, syncope, facial asymmetry, speech difficulty, weakness, light-headedness, numbness and headaches.  Hematological:  Negative for adenopathy. Does not bruise/bleed easily.  Psychiatric/Behavioral:  Negative for agitation, confusion and sleep disturbance.       Objective:   Physical Exam Vitals and nursing note reviewed.  Constitutional:      General: She is awake. She is not in acute distress.    Appearance: Normal appearance. She is well-developed and well-groomed. She is obese. She is not ill-appearing, toxic-appearing or diaphoretic.  HENT:     Head: Normocephalic and atraumatic.     Jaw: There is normal jaw occlusion.     Salivary Glands: Right  salivary gland is not diffusely enlarged. Left salivary gland is not diffusely enlarged.     Right Ear: Hearing and external ear normal.     Left Ear: Hearing and external ear normal.     Nose: Nose normal. No congestion or rhinorrhea.     Mouth/Throat:     Lips: Pink. No lesions.     Mouth: Mucous membranes are moist. No oral lesions or angioedema.     Pharynx: Oropharynx is clear.  Eyes:     General: Lids are normal. Vision grossly intact. Gaze aligned appropriately. No scleral icterus.       Right eye: No discharge.        Left eye: No discharge.     Extraocular Movements: Extraocular movements intact.     Conjunctiva/sclera: Conjunctivae normal.     Pupils: Pupils are equal, round, and reactive to light.  Neck:     Trachea: Trachea and phonation normal. No tracheal deviation.  Cardiovascular:     Rate and Rhythm: Normal rate and regular rhythm.     Pulses: Normal pulses.          Radial pulses are 2+ on the right side and 2+ on the left side.  Pulmonary:     Effort: Pulmonary effort is normal. No respiratory distress.     Breath sounds: Normal breath sounds and air entry. No stridor or transmitted upper airway sounds. No wheezing.     Comments: Spoke full sentences without difficulty; no cough observed in exam room Abdominal:     General: Abdomen is flat.  Musculoskeletal:        General: Swelling, tenderness  and signs of injury present. Normal range of motion.     Cervical back: Normal range of motion and neck supple.     Right upper leg: Swelling, edema and tenderness present. No deformity, lacerations or bony tenderness.     Left upper leg: No swelling, edema, deformity, lacerations, tenderness or bony tenderness.     Right lower leg: No edema.     Left lower leg: No edema.     Comments: Patient wearing dress; no nylons  Lymphadenopathy:     Head:     Right side of head: No submandibular or preauricular adenopathy.     Left side of head: No submandibular or preauricular  adenopathy.     Cervical:     Right cervical: No superficial cervical adenopathy.    Left cervical: No superficial cervical adenopathy.  Skin:    General: Skin is warm and dry.     Capillary Refill: Capillary refill takes less than 2 seconds.     Coloration: Skin is not ashen, cyanotic, jaundiced, mottled, pale or sallow.     Findings: Burn and erythema present. No abrasion, abscess, acne, bruising, ecchymosis, signs of injury, laceration, petechiae or wound. Rash is not crusting, nodular, purpuric, pustular, scaling, urticarial or vesicular.     Nails: There is no clubbing.          Comments: Linear 1x4cm upper lateral right thigh erythema slightly TTP nonpitting edema 0-1+/4 and smaller adjacent area distal 2x1cm; epithelial layer macular erythema; wounds dressed with aquaphor ointment and telfa gauze 2x4cm that has edge adhesive  Neurological:     General: No focal deficit present.     Mental Status: She is alert and oriented to person, place, and time. Mental status is at baseline.     GCS: GCS eye subscore is 4. GCS verbal subscore is 5. GCS motor subscore is 6.     Cranial Nerves: Cranial nerves 2-12 are intact.     Sensory: Sensation is intact.     Motor: Motor function is intact. No weakness, tremor, atrophy, abnormal muscle tone or seizure activity.     Coordination: Coordination is intact. Coordination normal.     Gait: Gait is intact. Gait normal.     Comments: In/out of chair without difficulty; gait sure and steady in clinic; bilateral hand grasp equal 5/5  Psychiatric:        Attention and Perception: Attention and perception normal.        Mood and Affect: Mood and affect normal.        Speech: Speech normal.        Behavior: Behavior normal. Behavior is cooperative.        Thought Content: Thought content normal.        Cognition and Memory: Cognition and memory normal.        Judgment: Judgment normal.      Erythema papules and macular right upper thigh linear  pattern    Assessment & Plan:   A-partial thickness burn right lower extremity initial visit, vitamin D deficiency, type 2 diabetes with Hgba1c goal less than 7  P-Given telfa and aquaphor UD from clinic stock for dressing changes this week.   Electronic Rx silvadene cream apply daily until healed #50 RF0 sent to patient pharmacy of choice.  Wash with soap and water daily.  If purulent discharge, worsening swelling/pain, fever, or chills notify clinic staff/seek re-evaluation for possible infected burn.  Exitcare handout on burn care printed and given to patient  Patient  verbalized understanding information/instructions, agreed with plan of care and had no further questions at this time.  Patient due for 3 month re-evaluation after starting vitamin D supplement to see if still deficient drawn nonfasting today and sent to [facility] quarterly due patient continuing diet/activity modfication/weight loss attempts drawn today nonfasting and sent to [facility].   Patient has had significant weight loss in the previous 6 months.  Discussed maintaining weight loss at this time is not easy either and that weight loss will probably slow as she continues efforts. Will send my chart message when results received or patient can see RN Hildred Alamin in clinic tomorrow for results.  Patient verbalized understanding information/instructions, agreed with plan of care and had no further questions at this time.

## 2022-01-09 LAB — VITAMIN D 25 HYDROXY (VIT D DEFICIENCY, FRACTURES): Vit D, 25-Hydroxy: 50.7 ng/mL (ref 30.0–100.0)

## 2022-01-09 LAB — HGB A1C W/O EAG: Hgb A1c MFr Bld: 5.9 % — ABNORMAL HIGH (ref 4.8–5.6)

## 2022-01-09 MED ORDER — VITAMIN D3 50 MCG (2000 UT) PO CAPS: 2000.0000 [IU] | ORAL_CAPSULE | Freq: Two times a day (BID) | ORAL | Status: DC

## 2022-01-09 NOTE — Addendum Note (Signed)
Addended by: Albina Billet A on: 01/09/2022 08:48 AM   Modules accepted: Orders

## 2022-03-24 ENCOUNTER — Encounter: Payer: Self-pay | Admitting: Registered Nurse

## 2022-03-24 ENCOUNTER — Ambulatory Visit: Payer: No Typology Code available for payment source | Admitting: Registered Nurse

## 2022-03-24 VITALS — BP 96/58 | HR 56 | Temp 97.5°F | Resp 16

## 2022-03-24 DIAGNOSIS — R110 Nausea: Secondary | ICD-10-CM

## 2022-03-24 DIAGNOSIS — N946 Dysmenorrhea, unspecified: Secondary | ICD-10-CM

## 2022-03-24 DIAGNOSIS — R42 Dizziness and giddiness: Secondary | ICD-10-CM

## 2022-03-24 NOTE — Patient Instructions (Signed)
Dizziness Dizziness is a common problem. It is a feeling of unsteadiness or light-headedness. You may feel like you are about to faint. Dizziness can lead to injury if you stumble or fall. Anyone can become dizzy, but dizziness is more common in older adults. This condition can be caused by a number of things, including medicines, dehydration, or illness. Follow these instructions at home: Eating and drinking  Drink enough fluid to keep your urine pale yellow. This helps to keep you from becoming dehydrated. Try to drink more clear fluids, such as water. Do not drink alcohol. Limit your caffeine intake if told to do so by your health care provider. Check ingredients and nutrition facts to see if a food or beverage contains caffeine. Limit your salt (sodium) intake if told to do so by your health care provider. Check ingredients and nutrition facts to see if a food or beverage contains sodium. Activity  Avoid making quick movements. Rise slowly from chairs and steady yourself until you feel okay. In the morning, first sit up on the side of the bed. When you feel okay, stand slowly while you hold onto something until you know that your balance is good. If you need to stand in one place for a long time, move your legs often. Tighten and relax the muscles in your legs while you are standing. Do not drive or use machinery if you feel dizzy. Avoid bending down if you feel dizzy. Place items in your home so that they are easy for you to reach without leaning over. Lifestyle Do not use any products that contain nicotine or tobacco. These products include cigarettes, chewing tobacco, and vaping devices, such as e-cigarettes. If you need help quitting, ask your health care provider. Try to reduce your stress level by using methods such as yoga or meditation. Talk with your health care provider if you need help to manage your stress. General instructions Watch your dizziness for any changes. Take  over-the-counter and prescription medicines only as told by your health care provider. Talk with your health care provider if you think that your dizziness is caused by a medicine that you are taking. Tell a friend or a family member that you are feeling dizzy. If he or she notices any changes in your behavior, have this person call your health care provider. Keep all follow-up visits. This is important. Contact a health care provider if: Your dizziness does not go away or you have new symptoms. Your dizziness or light-headedness gets worse. You feel nauseous. You have reduced hearing. You have a fever. You have neck pain or a stiff neck. Your dizziness leads to an injury or a fall. Get help right away if: You vomit or have diarrhea and are unable to eat or drink anything. You have problems talking, walking, swallowing, or using your arms, hands, or legs. You feel generally weak. You have any bleeding. You are not thinking clearly or you have trouble forming sentences. It may take a friend or family member to notice this. You have chest pain, abdominal pain, shortness of breath, or sweating. Your vision changes or you develop a severe headache. These symptoms may represent a serious problem that is an emergency. Do not wait to see if the symptoms will go away. Get medical help right away. Call your local emergency services (911 in the [place] Do not drive yourself to the hospital. Summary Dizziness is a feeling of unsteadiness or light-headedness. This condition can be caused by a number of   things, including medicines, dehydration, or illness. Anyone can become dizzy, but dizziness is more common in older adults. Drink enough fluid to keep your urine pale yellow. Do not drink alcohol. Avoid making quick movements if you feel dizzy. Monitor your dizziness for any changes. This information is not intended to replace advice given to you by your health care provider. Make sure you discuss any  questions you have with your health care provider. Document Revised: 07/15/2020 Document Reviewed: 07/15/2020 Elsevier Patient Education  2023 [employer] Nausea, Adult Nausea is the feeling of having an upset stomach or that you are about to vomit. Nausea on its own is not usually a serious concern, but it may be an early sign of a more serious medical problem. As nausea gets worse, it can lead to vomiting. If vomiting develops, or if you are not able to drink enough fluids, you are at risk of becoming dehydrated. Dehydration can make you tired and thirsty, cause you to have a dry mouth, and decrease how often you urinate. Older adults and people with other diseases or a weak disease-fighting system (immune system) are at higher risk for dehydration. The main goals of treating your nausea are: To relieve your nausea. To limit repeated nausea episodes. To prevent vomiting and dehydration. Follow these instructions at home: Watch your symptoms for any changes. Tell your health care provider about them. Eating and drinking     Take an oral rehydration solution (ORS). This is a drink that is sold at pharmacies and retail stores. Drink clear fluids slowly and in small amounts as you are able. Clear fluids include water, ice chips, low-calorie sports drinks, and fruit juice that has water added (diluted fruit juice). Eat bland, easy-to-digest foods in small amounts as you are able. These foods include bananas, applesauce, rice, lean meats, toast, and crackers. Avoid drinking fluids that contain a lot of sugar or caffeine, such as energy drinks, sports drinks, and soda. Avoid alcohol. Avoid spicy or fatty foods. General instructions Take over-the-counter and prescription medicines only as told by your health care provider. Rest at home while you recover. Drink enough fluid to keep your urine pale yellow. Breathe slowly and deeply when you feel nauseous. Avoid smelling things that have strong  odors. Wash your hands often using soap and water for at least 20 seconds. If soap and water are not available, use hand sanitizer. Make sure that everyone in your household washes their hands well and often. Keep all follow-up visits. This is important. Contact a health care provider if: Your nausea gets worse. Your nausea does not go away after two days. You vomit multiple times. You cannot drink fluids without vomiting. You have any of the following: New symptoms. A fever. A headache. Muscle cramps. A rash. Pain while urinating. You feel light-headed or dizzy. Get help right away if: You have pain in your chest, neck, arm, or jaw. You feel extremely weak or you faint. You have vomit that is bright red or looks like coffee grounds. You have bloody or black stools (feces) or stools that look like tar. You have a severe headache, a stiff neck, or both. You have severe pain, cramping, or bloating in your abdomen. You have difficulty breathing or are breathing very quickly. Your heart is beating very quickly. Your skin feels cold and clammy. You feel confused. You have signs of dehydration, such as: Dark urine, very little urine, or no urine. Cracked lips. Dry mouth. Sunken eyes. Sleepiness. Weakness. These symptoms  may be an emergency. Get help right away. Call 911. Do not wait to see if the symptoms will go away. Do not drive yourself to the hospital. Summary Nausea is the feeling that you have an upset stomach or that you are about to vomit. Nausea on its own is not usually a serious concern, but it may be an early sign of a more serious medical problem. If vomiting develops, or if you are not able to drink enough fluids, you are at risk of becoming dehydrated. Follow recommendations for eating and drinking and take over-the-counter and prescription medicines only as told by your health care provider. Contact a health care provider right away if your symptoms worsen or you  have new symptoms. Keep all follow-up visits. This is important. This information is not intended to replace advice given to you by your health care provider. Make sure you discuss any questions you have with your health care provider. Document Revised: 02/14/2021 Document Reviewed: 02/14/2021 Elsevier Patient Education  2023 [employer] Dysmenorrhea Dysmenorrhea refers to cramps caused by the muscles of the uterus tightening (contracting) during a menstrual period. Dysmenorrhea may be mild, or it may be severe enough to interfere with everyday activities for a few days each month. Primary dysmenorrhea is menstrual cramps that last a couple of days when a female starts having menstrual periods or soon after. As a female gets older or has a baby, the cramps will usually lessen or disappear. Secondary dysmenorrhea begins later in life and is caused by a disorder in the reproductive system. It lasts longer, and it may cause more pain than primary dysmenorrhea. The pain may start before the period and last a few days after the period. What are the causes? Dysmenorrhea is usually caused by an underlying problem, such as: Endometriosis. The tissue that lines the uterus (endometrium) growing outside of the uterus in other areas of the body. Adenomyosis. Endometrial tissue growing into the muscular walls of the uterus. Pelvic congestive syndrome. Blood vessels in the pelvis that fill with blood just before the menstrual period. Overgrowth of cells (polyps) in the endometrium or the lower part of the uterus (cervix). Uterine prolapse. The uterus dropping down into the vagina due to stretched or weak muscles. Bladder problems, such as infection or inflammation. Intestinal problems, such as a tumor or irritable bowel syndrome. Cancer of the reproductive organs or bladder. Other causes of this condition may result from: A severely tipped uterus. A cervix that is closed or has a small  opening. Noncancerous (benign) tumors in the uterus (fibroids). Pelvic inflammatory disease (PID). Pelvic scarring (adhesions) from a previous surgery. An ovarian cyst. An IUD (intrauterine device). What increases the risk? You are more likely to develop this condition if: You are younger than 48 years old. You started puberty early. You have irregular or heavy bleeding. You have never given birth. You have a family history of dysmenorrhea. You smoke or use nicotine products. You have high body weight or a low body weight. What are the signs or symptoms? Symptoms of this condition include: Cramping, throbbing pain in lower abdomen or lower back, or a feeling of fullness in the lower abdomen. Periods lasting for longer than 7 days. Headaches. Bloating. Fatigue. Nausea or vomiting. Diarrhea or loose stools. Sweating or dizziness. How is this diagnosed? This condition may be diagnosed based on: Your symptoms. Your medical history. A physical exam. Blood tests. A Pap test. This is a test in which cells from the cervix are tested  for signs of cancer or infection. A pregnancy test. You may also have other tests, including: Imaging tests, such as: Ultrasound. A procedure to remove and examine a sample of endometrial tissue (dilation and curettage, D&C). A procedure to visually examine the inside of: The uterus (hysteroscopy). The abdomen or pelvis (laparoscopy). The bladder (cystoscopy). X-rays. CT scan. MRI. How is this treated? Treatment depends on the cause of the dysmenorrhea. Treatment may include medicines, such as: Pain medicines. Hormone replacement therapy. Injections of progesterone to stop the menstrual period. Birth control pills that contain the hormone progesterone. An IUD that contains the hormone progesterone. NSAIDs, such as ibuprofen. These may help to stop the production of hormones that cause cramps. Antidepressant medicines. Other treatment may  include: Surgery to remove adhesions, endometriosis, ovarian cysts, fibroids, or the entire uterus (hysterectomy). Endometrial ablation. This is a procedure to destroy the endometrium. Presacral neurectomy. This is a procedure to cut the nerves in the bottom of the spine (sacrum) that go to the reproductive organs. Sacral nerve stimulation. This is a procedure to apply an electric current to nerves in the sacrum. Exercise and physical therapy. Meditation, yoga, and acupuncture. Work with your health care provider to determine what treatment or combination of treatments is best for you. Follow these instructions at home: Relieving pain and cramping  If directed, apply heat to your lower back or abdomen when you experience pain or cramps. Use the heat source that your health care provider recommends, such as a moist heat pack or a heating pad. Place a towel between your skin and the heat source. Leave the heat on for 20-30 minutes. Remove the heat if your skin turns bright red. This is especially important if you are unable to feel pain, heat, or cold. You may have a greater risk of getting burned. Do not sleep with a heating pad on. Exercise. Activities such as walking, swimming, or biking can help to relieve cramps. Massage your lower back or abdomen to help relieve pain. General instructions Take over-the-counter and prescription medicines only as told by your health care provider. Ask your health care provider if the medicine prescribed to you requires you to avoid driving or using machinery. Avoid alcohol and caffeine during and right before your period. These can make cramps worse. Do not use any products that contain nicotine or tobacco. These products include cigarettes, chewing tobacco, and vaping devices, such as e-cigarettes. If you need help quitting, ask your health care provider. Keep all follow-up visits. This is important. Contact a health care provider if: You have pain that  gets worse or does not get better with medicine. You have pain with sex. You develop nausea or vomiting with your period that is not controlled with medicine. Get help right away if: You faint. Summary Dysmenorrhea refers to cramps caused by the muscles of the uterus tightening (contracting) during a menstrual period. Dysmenorrhea may be mild, or it may be severe enough to interfere with everyday activities for a few days each month. Treatment depends on the cause of the dysmenorrhea. Work with your health care provider to determine what treatment or combination of treatments is best for you. This information is not intended to replace advice given to you by your health care provider. Make sure you discuss any questions you have with your health care provider. Document Revised: 03/27/2020 Document Reviewed: 03/27/2020 Elsevier Patient Education  [place].

## 2022-03-24 NOTE — Progress Notes (Signed)
Subjective:    Patient ID: Danielle Cooke, female    DOB: 05-02-1976, 46 y.o.   MRN: [id number]  46y/o caucasian female single established patient evaluated in workcenter.  Notified by patient supervisor dizziness/not feeling well/nausea  to come and evaluate patient.  Patient reported menses started yesterday did not have typical bad cramps or heavy flow like the previous few months.  Patient has stayed home from work to menstrual cramps/nausea with previous menstrual cycles this summer.  Stated had gush of menstrual flow bled through tampon she had inserted hour previous, did not have any more pads to change out, dizzy, felt hot, vision blackened around edges/nausea started notified her supervisor.  Patient reported ate normal breakfast and feeling well until menses flow increased mid morning.  Has not had PAP/pelvic in many years/overdue.  Otherwise feeling well denied recent illness/sick contacts.  Denied work being extra stressful "has been slow in [employer]room at [employer] break not scheduled until 2pm and hasn't been taking 15 minute am/pm breaks due to slow work pace as frequent breaks in work flow due to fewer customers this week.  Her mother told her to drink more water yesterday as noted she was not drinking as much as usual.  Urinating today amount maybe smaller than usual hard to tell urine color due to blood in toilet from menstrual flow.      Review of Systems  Constitutional:  Negative for activity change, appetite change, chills, diaphoresis, fatigue and fever.  HENT:  Negative for congestion, facial swelling, mouth sores, trouble swallowing and voice change.   Eyes:  Negative for photophobia and visual disturbance.  Respiratory:  Negative for cough, shortness of breath, wheezing and stridor.   Cardiovascular:  Negative for chest pain.  Gastrointestinal:  Positive for abdominal pain and nausea. Negative for abdominal distention, blood in stool, constipation, diarrhea and vomiting.   Endocrine: Negative for cold intolerance and heat intolerance.  Genitourinary:  Positive for decreased urine volume and vaginal bleeding. Negative for difficulty urinating, vaginal discharge and vaginal pain.  Musculoskeletal:  Negative for arthralgias, back pain, gait problem, myalgias, neck pain and neck stiffness.  Skin:  Negative for rash.  Allergic/Immunologic: Positive for environmental allergies. Negative for food allergies.  Neurological:  Positive for dizziness, weakness and light-headedness. Negative for tremors, seizures, syncope, facial asymmetry, speech difficulty, numbness and headaches.  Hematological:  Negative for adenopathy. Does not bruise/bleed easily.  Psychiatric/Behavioral:  Negative for agitation, confusion and sleep disturbance.        Objective:   Physical Exam Vitals and nursing note reviewed.  Constitutional:      General: She is awake. She is not in acute distress.    Appearance: Normal appearance. She is well-developed and well-groomed. She is obese. She is not ill-appearing, toxic-appearing or diaphoretic.  HENT:     Head: Normocephalic and atraumatic.     Jaw: There is normal jaw occlusion.     Salivary Glands: Right salivary gland is not diffusely enlarged. Left salivary gland is not diffusely enlarged.     Right Ear: Hearing and external ear normal.     Left Ear: Hearing and external ear normal.     Nose: Nose normal. No congestion or rhinorrhea.     Mouth/Throat:     Lips: Pink. No lesions.     Mouth: Mucous membranes are moist. No oral lesions or angioedema.     Dentition: No gum lesions.     Tongue: No lesions. Tongue does not deviate from midline.  Palate: No mass and lesions.     Pharynx: Oropharynx is clear. Uvula midline.     Tonsils: No tonsillar exudate.  Eyes:     General: Lids are normal. Vision grossly intact. Gaze aligned appropriately. Allergic shiner present. No scleral icterus.       Right eye: No discharge.        Left eye: No  discharge.     Extraocular Movements: Extraocular movements intact.     Right eye: Normal extraocular motion and no nystagmus.     Left eye: Normal extraocular motion and no nystagmus.     Conjunctiva/sclera: Conjunctivae normal.     Pupils: Pupils are equal, round, and reactive to light.  Neck:     Trachea: Trachea and phonation normal. No tracheal deviation.  Cardiovascular:     Rate and Rhythm: Normal rate and regular rhythm.     Pulses: Normal pulses.          Radial pulses are 2+ on the right side and 2+ on the left side.     Heart sounds: Normal heart sounds.  Pulmonary:     Effort: Pulmonary effort is normal. No respiratory distress.     Breath sounds: Normal breath sounds and air entry. No stridor, decreased air movement or transmitted upper airway sounds. No decreased breath sounds, wheezing, rhonchi or rales.     Comments: Spoke full sentences without difficulty; no cough observed during PE Abdominal:     Palpations: Abdomen is soft.  Musculoskeletal:        General: No signs of injury. Normal range of motion.     Right hand: No swelling. Normal strength. Normal capillary refill.     Left hand: No swelling. Normal strength. Normal capillary refill.     Cervical back: Normal range of motion and neck supple. No swelling, edema, deformity, erythema, signs of trauma, lacerations, rigidity or crepitus. No pain with movement. Normal range of motion.     Thoracic back: No swelling, edema, deformity, signs of trauma, lacerations or tenderness. Normal range of motion.     Right lower leg: No edema.     Left lower leg: No edema.     Comments: Initially patient kneeling on floor by garbage next to conference table; did not want to sit in chair as pad/tampon saturated with menses blood/afraid would bleed through clothes; ambulated with patient to bathroom given 4 regular thin pads from clinic stock ; gait sure and steady; then rechecked blood pressure/VS sitting in chair  Lymphadenopathy:      Head:     Right side of head: No submandibular or preauricular adenopathy.     Left side of head: No submandibular or preauricular adenopathy.     Cervical:     Right cervical: No superficial cervical adenopathy.    Left cervical: No superficial cervical adenopathy.  Skin:    General: Skin is warm and dry.     Capillary Refill: Capillary refill takes less than 2 seconds.     Coloration: Skin is not ashen, cyanotic, jaundiced, mottled, pale or sallow.     Findings: No abrasion, abscess, acne, bruising, burn, ecchymosis, erythema, signs of injury, laceration, lesion, petechiae, rash or wound.     Nails: There is no clubbing.  Neurological:     General: No focal deficit present.     Mental Status: She is alert and oriented to person, place, and time. Mental status is at baseline.     GCS: GCS eye subscore is 4. GCS verbal  subscore is 5. GCS motor subscore is 6.     Cranial Nerves: Cranial nerves 2-12 are intact. No cranial nerve deficit, dysarthria or facial asymmetry.     Sensory: Sensation is intact. No sensory deficit.     Motor: Motor function is intact. No weakness, tremor, atrophy, abnormal muscle tone or seizure activity.     Coordination: Coordination is intact. Coordination normal.     Gait: Gait is intact. Gait normal.     Comments: In/out of chair without difficulty; gait sure and steady in warehouse; bilateral hand grasp equal 5/5  Psychiatric:        Attention and Perception: Attention and perception normal.        Mood and Affect: Mood and affect normal.        Speech: Speech normal.        Behavior: Behavior normal. Behavior is cooperative.        Thought Content: Thought content normal.        Cognition and Memory: Cognition and memory normal.        Judgment: Judgment normal.           Assessment & Plan:  A-dysmenorrhea, dizziness, nausea without vomiting  P-Schedule pelvic/pap with PCM/GYN overdue.  Discussed heavy menses/cramps can be treated with  IUD/medications.  Want to ensure heavy bleeding not from changes in endometrium that require procedure/removal first.  May use tylenol 1000mg  po q6h prn pain today.  Patient verbalized understanding information/instructions, agreed with plan of care and had no further questions at this time.  Anxiety related to heavy menstrual flow/embarrassment of bleeding through clothes at work/requiring clinic staff assistance in workcenter.  Patient was moved to private conference room and RN or NP Oda Cogan stayed with patient until she decided she wanted to go home VSS.  Discussed with patient no driving while dizzy/n/v.  Danielle Cooke coworker drove patient home.  Possible mild dehydration since less urine volume noted today; pulse baseline/not elevated and mucous membranes moist.  Patient drank 1 cup of water with clinic staff present.  Some worsening of nausea.  Has ondansetron/zofran 4mg  at home for prn use may use today TID prn.  Friend driving patient home as she does not feel able to work today/now and she notified her supervisor Lorra Hals.  Due to clinic limitations contract unable to write work excusals for patient.  Consider covid test if URI symptoms develop as circulating in community increasing sewer viral levels past 2 weeks.   I have recommended clear fluids and bland diet.  Avoid dairy/spicy, fried and large portions of meat while having nausea.  If vomiting hold po intake x 1 hour.  Then sips clear fluids like broths, ginger ale, power ade, gatorade, pedialyte may advance to soft/bland if no vomiting x 24 hours and appetite returned otherwise hydration main focus.   I have alerted the patient to call if high fever, dehydration, marked weakness, fainting, increased abdominal pain, blood in stool or vomit (red or black).   Exitcare handout on nausea/vomiting.  Patient plans to alternate water with powerade this afternoon discussed shoot for 1 cup per hour while awake and to have pale yellow  urine/voiding every 2-4 hours while awake moderate amount of urine.  If unable to tolerate po fluids despite zofran/repetitive vomiting may need to see UC/ER provider.   Patient verbalized agreement and understanding of treatment plan and had no further questions at this time.

## 2022-03-25 ENCOUNTER — Telehealth: Payer: Self-pay | Admitting: Registered Nurse

## 2022-03-25 NOTE — Telephone Encounter (Signed)
Patient contacted and stated she is feeling much better today.  Will follow up again with patient tomorrow when onsite in person.

## 2022-03-31 NOTE — Telephone Encounter (Signed)
Patient reported all symptoms resolved.  Menses followed typical pattern spotting day 1, heavy day 2-3 then tapered off day 4 and ceased.  All other symptoms resolved denied questions or concerns.  Gait sure and steady in warehouse, skin warm dry and pink, respirations even and unlabored RA.  Working in [employer] without difficulty.  Encouraged patient to schedule pap/pelvic as overdue with PCM/GYN.  Patient verbalized understanding information/instructions, agreed with plan of care and had no further questions at this time.

## 2022-04-09 ENCOUNTER — Ambulatory Visit
Admission: RE | Admit: 2022-04-09 | Discharge: 2022-04-09 | Disposition: A | Discharge status: Home / Self Care | Payer: No Typology Code available for payment source | Source: Ambulatory Visit | Attending: Family Medicine | Admitting: Family Medicine

## 2022-04-09 ENCOUNTER — Other Ambulatory Visit: Payer: Self-pay | Admitting: Family Medicine

## 2022-04-09 DIAGNOSIS — R928 Other abnormal and inconclusive findings on diagnostic imaging of breast: Secondary | ICD-10-CM

## 2022-04-09 DIAGNOSIS — N6320 Unspecified lump in the left breast, unspecified quadrant: Secondary | ICD-10-CM

## 2022-07-08 ENCOUNTER — Ambulatory Visit: Payer: No Typology Code available for payment source | Admitting: Occupational Medicine

## 2022-07-08 VITALS — BP 110/70 | HR 64

## 2022-07-08 DIAGNOSIS — R42 Dizziness and giddiness: Secondary | ICD-10-CM

## 2022-07-08 NOTE — Progress Notes (Signed)
Patient came in with mild dizziness after tilting head up. Continues still mild no gait issues at this time. Pt reports on the way over remembered had espresso in coffee this am and hasn't drank any water today. VSS. Hx of sinus issues for over a year takes allergy meds. Pt ate breakfast this am 2 boiled eggs.  Pt drank bottle of water in clinic and given one to drink later. Call Inetta Fermo NP for recommendations She said to drink water and if continues to schedule appt for tomorrow. If anything worsens to let the clinic know.

## 2022-07-10 ENCOUNTER — Encounter: Payer: Self-pay | Admitting: Registered Nurse

## 2022-07-10 ENCOUNTER — Telehealth: Payer: Self-pay | Admitting: Registered Nurse

## 2022-07-17 ENCOUNTER — Encounter: Payer: Self-pay | Admitting: Registered Nurse

## 2022-07-17 ENCOUNTER — Telehealth: Payer: Self-pay | Admitting: Registered Nurse

## 2022-07-17 DIAGNOSIS — U071 COVID-19: Secondary | ICD-10-CM

## 2022-07-17 MED ORDER — BENZONATATE 200 MG PO CAPS: 200.0000 mg | ORAL_CAPSULE | Freq: Three times a day (TID) | ORAL | 0 refills | Status: AC | PRN

## 2022-07-17 MED ORDER — SALINE SPRAY 0.65 % NA SOLN: 2.0000 | NASAL | 0 refills | Status: DC

## 2022-07-17 MED ORDER — NIRMATRELVIR/RITONAVIR (PAXLOVID)TABLET: 3.0000 | ORAL_TABLET | Freq: Two times a day (BID) | ORAL | 0 refills | Status: AC

## 2022-07-17 NOTE — Telephone Encounter (Signed)
Patient reported positive covid test 07/16/22 picture received positive  Contacted patient via telephone.  She reported headache, muscle aches and runny nose yesterday.  Had been having allergy symptoms all week headache and runny nose. Temperature fluctuating 99-100 with skin thermometer trying to sit up in bed without blanket or pillow on forehead for 15 minutes before taking temperature reading.  Sp02 92-98% RA.  Has inhaler at home hasn't required use.  Using sudafed (pseudoephedrine), theraflu (tylenol, guaiafenesen, phenylephrine, dextromethorphan), flonase and nasal saline prn.  Ran out of nasal saline.  Concerned about OTC medications having warning on it regarding caution if high blood pressure, diabetes use.  Tolerating po intake without difficulty.  Disccused phenylephrine and pseudoephedrine can raise heart rate and blood pressure.  Illness with covid can do so also.  Use flonase and nasal saline (mother going to pharmacy to pick her up more saline today.)  Patient is interested in covid antiviral.  Discussed stop lipitor (cholesterol) medication and start paxlovid today 3 pills twice a day for 5 days.  Electronic Rx sent to her pharmacy of choice nirmatrelvir/ritonavir 150/100mg  take 2/take 1 po BID x 5 days #20/10 RF0  Discussed most common side effects GI upset/bad taste in mouth.  May try lemonade with pills to help with metallic/plastic taste in mouth.  Discussed hydrate and bland diet if diarrhea with covid/paxlovid and may start OTC immodium per manufacturer's instructions if bad diarrhea.  Patient may call/email me this weekend if questions or concerns.  Discussed RN Bess Kinds not in clinic again until Monday 11/27 [phone].   5 day quarantine per [facility] recommendations. Day 1 of quarantine was today 11/24.  Day 0 11/23 positive test date. Day 5 11/28 estimated RTW with mask 11/29 Day 6 and Day 10 last day of mask wear/not eating in employee lunch room 12/3.  Mother sick was seen at urgent care tested  for flu/covid/RSV negative but has bad cough and sore throat also.  They are quarantining apart from each other.  Patient to contact me if vomiting after coughing or unable to tolerate po fluids.  Discussed flu and other viral illnesses circulating in community and some causing GI upset.  If GI upset I have recommended clear fluids then bland diet.  Avoid dairy/spicy, fried and large portions of meat while having nausea.  If vomiting hold po intake x 1 hour.  Then sips clear fluids like broths, ginger ale, power ade, gatorade, pedialyte may advance to soft/bland if no vomiting x 24 hours and appetite returned otherwise hydration main focus.   Patient has sp02 monitor at home and to notify clinic staff if sp02 less than 90 or pulse greater than 100 after resting/deep breathing/albuterol inhaler.  Patient to isolate in own room and if possible use only one bathroom if living with others in home.  Wear mask when out of room to help prevent spread to others in household.  Sanitize high touch surfaces with lysol/chlorox/bleach spray or wipes daily as viruses are known to live on surfaces from 24 hours to days.  Patient has medication at home for cough/cold use and is working for her. May use flonase nasal 1 spray each nostril BID prn rhinitis.  Sudafed and theraflu per manufacturer instructions.  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  Tessalon pearles 200mg  po TID prn cough #30 RF0 electronic Rx sent to her pharmacy of choice.  Patient has proair inhaler at home for prn use  protracting cough/wheezing/chest tightness.  Hasn't used yet but reports occasional chest tightness encouraged patient to start use.  Discussed albuterol use with patient 1-2 puffs po q4-6 hours prn.   Discussed honey 1 tablespoon every 4 hours is a natural cough suppressant but caution due to her diabetes.  Avoid dehydration and drink water to keep urine pale yellow clear and voiding  every 2-4 hours while awake.  Patient alert and oriented x3, spoke full sentences without difficulty.  Some nasal congestion noted.  No audible cough or throat clearing during 26 minute telephone call.  Discussed with patient she can contact me at this time number [phone] this weekend as clinic closed if questions or concerns or email [email or web address]  Exitcare handouts on paxlovid and covid quarantine sent to patient.  Pt verbalized understanding and agreement with plan of care. No further questions/concerns at this time. Pt reminded to contact clinic with any changes in symptoms or questions/concerns. HR notified patient to work remote/quarantine through Day 5 RTW estimated Day 6 with strict mask wear through Day 10 and no eating in employee lunch room.  Estimated return to work onsite 11/29.  Patient denied any close contacts at work 07/15/22.  Patient supervisor Zorita Pang notified excused absence through 11/28 estimated RTW 07/22/2022

## 2022-07-21 ENCOUNTER — Ambulatory Visit: Payer: No Typology Code available for payment source | Admitting: Occupational Medicine

## 2022-07-21 DIAGNOSIS — U071 COVID-19: Secondary | ICD-10-CM

## 2022-07-21 NOTE — Progress Notes (Signed)
Phone follow up call prior to coming back to work. Patient Fever free. No NVD. Pt feeling much better. Educated if any further issues to come to the clinic.   

## 2022-07-22 ENCOUNTER — Ambulatory Visit: Payer: No Typology Code available for payment source | Admitting: Occupational Medicine

## 2022-07-22 DIAGNOSIS — L309 Dermatitis, unspecified: Secondary | ICD-10-CM

## 2022-07-22 NOTE — Telephone Encounter (Signed)
Notified by RN Bess Kinds patient with worsening thigh rash appt scheduled with me for evaluation tomorrow.  May trial aquaphor and triamcinolone cream until appt 07/23/22.  Reviewed RN Bess Kinds notes for covid follow up with patient.  Agreed with cleared to return onsite 07/22/22 afebrile/no n/v/d in previous 24 hours.

## 2022-07-22 NOTE — Progress Notes (Signed)
Patient reports history of rash to left thigh. However, its more spots than she normal gets. Red raised and itching. Appears the same has normal just more. Has prescription for cream to apply. She  wasn't sure if could use on this wanted to have looked at first. Told since like before could use if not improving or worsen we could get her appt with NP tomorrow. Also reports slight bleeding from nose. Given Aquaphor to apply liberal and told when using saline spray to do lightly not forceful.

## 2022-07-22 NOTE — Telephone Encounter (Signed)
See tcon dated 07/17/22

## 2022-07-23 ENCOUNTER — Ambulatory Visit: Payer: No Typology Code available for payment source | Admitting: Registered Nurse

## 2022-07-23 VITALS — BP 125/89 | HR 55 | Temp 98.1°F

## 2022-07-23 DIAGNOSIS — L309 Dermatitis, unspecified: Secondary | ICD-10-CM

## 2022-07-23 DIAGNOSIS — U071 COVID-19: Secondary | ICD-10-CM

## 2022-07-24 ENCOUNTER — Encounter: Payer: Self-pay | Admitting: Registered Nurse

## 2022-07-24 NOTE — Progress Notes (Signed)
Subjective:    Patient ID: Danielle Cooke, female    DOB: 04/24/1976, 46 y.o.   MRN: [id number]  46y/o established single caucasian female here for evaluation rash right anterior thigh worsening.  While at home with covid wore leggings. Denied itching but lesions had been increasing in diameter. Denied discharge/fever/chills. Restarted steroid cream use last night. Hx DM2, obesity.  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      Review of Systems  Constitutional:  Positive for fatigue. Negative for activity change, appetite change, chills, diaphoresis and fever.  HENT:  Negative for trouble swallowing and voice change.   Eyes:  Negative for photophobia and visual disturbance.  Respiratory:  Negative for cough, shortness of breath, wheezing and stridor.   Cardiovascular:  Negative for chest pain and palpitations.  Gastrointestinal:  Negative for diarrhea, nausea and vomiting.  Endocrine: Negative for cold intolerance and heat intolerance.  Genitourinary:  Negative for difficulty urinating.  Musculoskeletal:  Negative for gait problem, myalgias, neck pain and neck stiffness.  Skin:  Positive for color change and rash. Negative for pallor and wound.  Allergic/Immunologic: Negative for food allergies.  Neurological:  Negative for dizziness, tremors, seizures, syncope, facial asymmetry, speech difficulty, weakness, light-headedness, numbness and headaches.  Hematological:  Negative for adenopathy. Does not bruise/bleed easily.  Psychiatric/Behavioral:  Negative for agitation, confusion and sleep disturbance.        Objective:   Physical Exam Vitals and nursing note reviewed.  Constitutional:      General: She is awake. She is not in acute distress.    Appearance: Normal appearance. She is well-developed and well-groomed. She is  obese. She is not ill-appearing, toxic-appearing or diaphoretic.  HENT:     Head: Normocephalic and atraumatic.     Jaw: There is normal jaw occlusion.     Salivary Glands: Right salivary gland is not diffusely enlarged. Left salivary gland is not diffusely enlarged.     Right Ear: Hearing and external ear normal.     Left Ear: Hearing and external ear normal.     Nose: Nose normal. No congestion or rhinorrhea.  Eyes:     General: Lids are normal. Vision grossly intact. Gaze aligned appropriately. Allergic shiner present. No scleral icterus.       Right eye: No discharge.        Left eye: No discharge.     Extraocular Movements: Extraocular movements intact.     Right eye: Normal extraocular motion and no nystagmus.     Left eye: Normal extraocular motion and no nystagmus.     Conjunctiva/sclera: Conjunctivae normal.     Right eye: Right conjunctiva is not injected. No chemosis, exudate or hemorrhage.    Left eye: Left conjunctiva is not injected. No chemosis, exudate or hemorrhage.    Pupils: Pupils are equal, round, and reactive to light.  Neck:     Trachea: Trachea and phonation normal. No tracheal deviation.  Cardiovascular:     Rate and Rhythm: Normal rate and regular rhythm.     Pulses: Normal pulses.  Pulmonary:     Effort: Pulmonary effort is normal. No respiratory distress.     Breath sounds: Normal breath sounds and air entry. No stridor or transmitted upper airway sounds. No wheezing, rhonchi or rales.     Comments: Spoke full sentences without difficulty; no cough observed in  exam room; wearing mask cloth Abdominal:     Palpations: Abdomen is soft.  Musculoskeletal:        General: Normal range of motion.     Cervical back: Normal range of motion and neck supple. No edema, erythema, rigidity, torticollis or crepitus. No pain with movement. Normal range of motion.     Right upper leg: No swelling, edema, deformity, lacerations, tenderness or bony tenderness.     Left  upper leg: No swelling, edema, deformity, lacerations, tenderness or bony tenderness.     Right lower leg: No edema.     Left lower leg: No edema.  Lymphadenopathy:     Head:     Right side of head: No submandibular or preauricular adenopathy.     Left side of head: No submandibular or preauricular adenopathy.     Cervical: No cervical adenopathy.     Right cervical: No superficial cervical adenopathy.    Left cervical: No superficial cervical adenopathy.  Skin:    General: Skin is warm and dry.     Capillary Refill: Capillary refill takes less than 2 seconds.     Coloration: Skin is not ashen, cyanotic, jaundiced, mottled, pale or sallow.     Findings: Erythema and rash present. No abrasion, abscess, acne, bruising, burn, ecchymosis, signs of injury, laceration, petechiae or wound. Rash is macular, papular and scaling. Rash is not crusting, nodular, purpuric, pustular, urticarial or vesicular.     Nails: There is no clubbing.          Comments: Dry nummular erythematous macular papular fine scale rash right anterior thigh distally grouped in linear pattern of smaller diameter 0.25-0.5cm lesions x 5; largest lesion proximal to femoral 1.5cm diameter; patient wearing jeans today form fitting; no abrasions/fluctuance/bruising/edema/crusting/increased temperature compared to nonerythematous skin adjacent  Neurological:     General: No focal deficit present.     Mental Status: She is alert and oriented to person, place, and time. Mental status is at baseline.     GCS: GCS eye subscore is 4. GCS verbal subscore is 5. GCS motor subscore is 6.     Cranial Nerves: Cranial nerves 2-12 are intact. No cranial nerve deficit, dysarthria or facial asymmetry.     Motor: Motor function is intact. No weakness, tremor, atrophy, abnormal muscle tone or seizure activity.     Coordination: Coordination is intact. Coordination normal.     Gait: Gait is intact. Gait normal.     Comments: In/out of chair without  difficulty; gait sure and steady in clinic; bilateral hand grasp equal 5/5  Psychiatric:        Attention and Perception: Attention and perception normal.        Mood and Affect: Mood and affect normal.        Speech: Speech normal.        Behavior: Behavior normal. Behavior is cooperative.        Thought Content: Thought content normal.        Cognition and Memory: Cognition and memory normal.        Judgment: Judgment normal.           Assessment & Plan:   A-dermatitis lower extremity right initial evaluation, lab test positive covid  P-continue triamcinolone cream 0.1% topical BID to lesions.  Keep covered with telfa bandaid given 6 from clinic stock.  Triple antibiotic applied then covered with telfa 3x4inch bandaid at 2 right anterior thigh to keep jeans from rubbing lesions.  Discussed with patient after  bathing/applying steroid cream/emollient apply cling wrap over rash to sleep.  Patient stated does not need refill at this time  Medication as directed.  Continue daily emollient e.g. Vaseline/aquaphor/cocoa butter after bathing and prn dry skin/irritation.  Avoid hot steamy showers. Symptomatic therapy suggested. Call or return to clinic as needed if these symptoms worsen or fail to improve as anticipated especially if drainage, worsening size or becoming painful.  If itchy may use OTC antihistamine of her choice e.g. benadryl/zyrtec prn.   Exitcare handout on atopic dermatitis. Patient verbalized agreement and understanding of treatment plan and had no further questions at this time.    Continue plan of care as previously discussed in 07/17/22 tcon; mask wear at work through 26 Jul 2022 and no eating in lunch room.   If drinking beverages at desk use straw under mask. Symptoms improving avoid dehydration and naps prn when not at work.  Notify staff if new or worsening symptoms.  Patient agreed with plan of care and had no further questions at this time.

## 2022-07-24 NOTE — Patient Instructions (Signed)
Atopic Dermatitis ?Atopic dermatitis is a skin disorder that causes inflammation of the skin. It is marked by a red rash and itchy, dry, scaly skin. It is the most common type of eczema. Eczema is a group of skin conditions that cause the skin to become rough and swollen. This condition is generally worse during the cooler winter months and often improves during the warm summer months. ?Atopic dermatitis usually starts showing signs in infancy and can last through adulthood. This condition cannot be passed from one person to another (is not contagious). Atopic dermatitis may not always be present, but when it is, it is called a flare-up. ?What are the causes? ?The exact cause of this condition is not known. Flare-ups may be triggered by: ?Coming in contact with something that you are sensitive or allergic to (allergen). ?Stress. ?Certain foods. ?Extremely hot or cold weather. ?Harsh chemicals and soaps. ?Dry air. ?Chlorine. ?What increases the risk? ?This condition is more likely to develop in people who have a personal or family history of: ?Eczema. ?Allergies. ?Asthma. ?Hay fever. ?What are the signs or symptoms? ?Symptoms of this condition include: ?Dry, scaly skin. ?Red, itchy rash. ?Itchiness, which can be severe. This may occur before the skin rash. This can make sleeping difficult. ?Skin thickening and cracking that can occur over time. ?How is this diagnosed? ?This condition is diagnosed based on: ?Your symptoms. ?Your medical history. ?A physical exam. ?How is this treated? ?There is no cure for this condition, but symptoms can usually be controlled. Treatment focuses on: ?Controlling the itchiness and scratching. You may be given medicines, such as antihistamines or steroid creams. ?Limiting exposure to allergens. ?Recognizing situations that cause stress and developing a plan to manage stress. ?If your atopic dermatitis does not get better with medicines, or if it is all over your body (widespread), a  treatment using a specific type of light (phototherapy) may be used. ?Follow these instructions at home: ?Skin care ? ?Keep your skin well moisturized. Doing this seals in moisture and helps to prevent dryness. ?Use unscented lotions that have petroleum in them. ?Avoid lotions that contain alcohol or water. They can dry the skin. ?Keep baths or showers short (less than 5 minutes) in warm water. Do not use hot water. ?Use mild, unscented cleansers for bathing. Avoid soap and bubble bath. ?Apply a moisturizer to your skin right after a bath or shower. ?Do not apply anything to your skin without checking with your health care provider. ?General instructions ?Take or apply over-the-counter and prescription medicines only as told by your health care provider. ?Dress in clothes made of cotton or cotton blends. Dress lightly because heat increases itchiness. ?When washing your clothes, rinse your clothes twice so all of the soap is removed. ?Avoid any triggers that can cause a flare-up. ?Keep your fingernails cut short. ?Avoid scratching. Scratching makes the rash and itchiness worse. A break in the skin from scratching could result in a skin infection (impetigo). ?Do not be around people who have cold sores or fever blisters. If you get the infection, it may cause your atopic dermatitis to worsen. ?Keep all follow-up visits. This is important. ?Contact a health care provider if: ?Your itchiness interferes with sleep. ?Your rash gets worse or is not better within one week of starting treatment. ?You have a fever. ?You have a rash flare-up after having contact with someone who has cold sores or fever blisters. ?Get help right away if: ?You develop pus or soft yellow scabs in the rash   area. ?Summary ?Atopic dermatitis causes a red rash and itchy, dry, scaly skin. ?Treatment focuses on controlling the itchiness and scratching, limiting exposure to things that you are sensitive or allergic to (allergens), recognizing  situations that cause stress, and developing a plan to manage stress. ?Keep your skin well moisturized. ?Keep baths or showers shorter than 5 minutes and use warm water. Do not use hot water. ?This information is not intended to replace advice given to you by your health care provider. Make sure you discuss any questions you have with your health care provider. ?Document Revised: 05/20/2020 Document Reviewed: 05/20/2020 ?Elsevier Patient Education ? 2023 [employer] ? ?

## 2022-07-28 ENCOUNTER — Telehealth: Payer: Self-pay | Admitting: Registered Nurse

## 2022-07-28 DIAGNOSIS — J4520 Mild intermittent asthma, uncomplicated: Secondary | ICD-10-CM

## 2022-07-30 ENCOUNTER — Encounter: Payer: Self-pay | Admitting: Registered Nurse

## 2022-07-30 MED ORDER — ALBUTEROL SULFATE HFA 108 (90 BASE) MCG/ACT IN AERS: 1.0000 | INHALATION_SPRAY | RESPIRATORY_TRACT | 0 refills | Status: DC | PRN

## 2022-07-30 NOTE — Telephone Encounter (Signed)
Patient requested albuterol refill 153mcg/act 2p po q4h prn chest tightness/wheezing.  Patient reported she had asthma attack at work and no inhaler at work.  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  Rx sent to her pharmacy of choice.  Patient stated she drank coffee and it helped to relieve her breathing/chest tightness.  Refused office visit with RN Kimrey.  Patient verbalized understanding information and had no further questions at that time.

## 2022-07-30 NOTE — Telephone Encounter (Signed)
See office note patient seen in clinic 07/23/22.

## 2022-07-31 NOTE — Telephone Encounter (Signed)
Patient reported rash improving satellite lesions decreasing in size.  Cling wrap falls off in middle of night but it is helping the creams to penetrate skin better.  Continue plan of care as discussed at office visit.  Discussed will follow up with her in a week  Patient verbalized understanding information/instructions, agreed with plan of care and had no further questions at that time.

## 2022-08-05 ENCOUNTER — Ambulatory Visit: Payer: No Typology Code available for payment source | Admitting: Occupational Medicine

## 2022-08-05 DIAGNOSIS — K219 Gastro-esophageal reflux disease without esophagitis: Secondary | ICD-10-CM

## 2022-08-05 NOTE — Progress Notes (Signed)
Patient complains of ingestion given antacids at this time.

## 2022-08-05 NOTE — Telephone Encounter (Signed)
Patient reported neosporin and steroid cream combination working the best and rash continues to decrease in size and healing.  Denied concerns/new questions.

## 2022-08-20 DIAGNOSIS — Z83511 Family history of glaucoma: Secondary | ICD-10-CM | POA: Insufficient documentation

## 2022-08-20 DIAGNOSIS — H40053 Ocular hypertension, bilateral: Secondary | ICD-10-CM | POA: Insufficient documentation

## 2022-08-20 DIAGNOSIS — H524 Presbyopia: Secondary | ICD-10-CM | POA: Insufficient documentation

## 2022-09-17 ENCOUNTER — Ambulatory Visit
Admission: RE | Admit: 2022-09-17 | Discharge: 2022-09-17 | Disposition: A | Discharge status: Home / Self Care | Payer: No Typology Code available for payment source | Source: Ambulatory Visit | Attending: Family Medicine | Admitting: Family Medicine

## 2022-09-17 DIAGNOSIS — N6320 Unspecified lump in the left breast, unspecified quadrant: Secondary | ICD-10-CM

## 2022-10-07 ENCOUNTER — Ambulatory Visit: Payer: No Typology Code available for payment source | Admitting: Occupational Medicine

## 2022-10-07 DIAGNOSIS — Z0000 Encounter for general adult medical examination without abnormal findings: Secondary | ICD-10-CM

## 2022-10-07 NOTE — Progress Notes (Signed)
Be well insurance premium discount evaluation:     Patient completed PCM office visit epic reviewed by RN Evlyn Kanner and transcribed. Labs  Tobacco attestation signed. [facility] ROI formed signed. Forms placed in the chart.   Patient given handouts for [facility] pharmacies and discount drugs list,MyChart, Tele doc setup, Tele doc Behavioral, [place] counseling and [facility] counseling.  What to do for infectious illness protocol. Given handout for list of medications that can be filled at [facility]. Given Clinic hours and Clinic Email.

## 2022-11-11 ENCOUNTER — Encounter: Payer: Self-pay | Admitting: Registered Nurse

## 2022-11-11 ENCOUNTER — Telehealth: Payer: Self-pay | Admitting: Registered Nurse

## 2022-11-11 DIAGNOSIS — Z8639 Personal history of other endocrine, nutritional and metabolic disease: Secondary | ICD-10-CM

## 2022-11-11 DIAGNOSIS — E119 Type 2 diabetes mellitus without complications: Secondary | ICD-10-CM

## 2022-11-11 NOTE — Telephone Encounter (Signed)
Hgba1c and Vitamin D due in May please schedule.  Last labs at [facility] care everywhere completed Be Well 2025 at that time.

## 2022-11-16 ENCOUNTER — Encounter: Payer: Self-pay | Admitting: Registered Nurse

## 2022-11-16 ENCOUNTER — Ambulatory Visit: Payer: No Typology Code available for payment source | Admitting: Occupational Medicine

## 2022-11-16 DIAGNOSIS — R300 Dysuria: Secondary | ICD-10-CM

## 2022-11-16 DIAGNOSIS — R599 Enlarged lymph nodes, unspecified: Secondary | ICD-10-CM

## 2022-11-16 LAB — POCT URINALYSIS DIPSTICK OB
Bilirubin, UA: NEGATIVE
Blood, UA: NEGATIVE
Glucose, UA: NEGATIVE
Ketones, UA: NEGATIVE
Leukocytes, UA: NEGATIVE
Nitrite, UA: NEGATIVE
POC,PROTEIN,UA: NEGATIVE
Spec Grav, UA: >=1.03 — AB (ref 1.010–1.025)
Urobilinogen, UA: 0.2 E.U./dL
pH, UA: 6 (ref 5.0–8.0)

## 2022-11-16 NOTE — Progress Notes (Signed)
My chart message sent to patient Danielle Cooke, Drink more water as urine concentrated.  Urine culture sent to [facility] will take up to 72 hours for results.  Notify clinic staff if abdomen/back pain/vomiting/fever or chills.  If tea/cola colored urine need to see provider for IV fluids/imaging.  No infection seen on dipstick today.  No blood/protein/sugar/bacteria on dipstick. Exitcare handouts on dysuria sent to my chart.  May use azo/pyidium 100-200mg  by mouth every 8 hours x 2 days over the counter if burning/bladder spasms. Sincerely, Gerarda Fraction NP-C

## 2022-11-16 NOTE — Progress Notes (Signed)
Patient reports dysuria on and on since Friday at the end of urination stream. Patient reports pain to left outer breast sore since Saturday. Appears lymph node is swollen. Scheduled with NP in am. Urine dip and urine culture sent.

## 2022-11-16 NOTE — Addendum Note (Signed)
Addended by: Gerarda Fraction A on: 11/16/2022 03:53 PM   Modules accepted: Orders, Level of Service

## 2022-11-17 ENCOUNTER — Ambulatory Visit: Payer: No Typology Code available for payment source | Admitting: Registered Nurse

## 2022-11-17 ENCOUNTER — Encounter: Payer: Self-pay | Admitting: Registered Nurse

## 2022-11-17 VITALS — BP 124/78 | HR 54 | Temp 98.5°F

## 2022-11-17 DIAGNOSIS — R222 Localized swelling, mass and lump, trunk: Secondary | ICD-10-CM

## 2022-11-17 DIAGNOSIS — J301 Allergic rhinitis due to pollen: Secondary | ICD-10-CM

## 2022-11-17 MED ORDER — MONTELUKAST SODIUM 10 MG PO TABS: 10.0000 mg | ORAL_TABLET | Freq: Every day | ORAL | 3 refills | Status: DC

## 2022-11-17 MED ORDER — SALINE SPRAY 0.65 % NA SOLN: 2.0000 | NASAL | 0 refills | Status: DC

## 2022-11-17 NOTE — Progress Notes (Signed)
Subjective:    Patient ID: Danielle Cooke, female    DOB: [birth year], 47 y.o.   MRN: [id number]  47y/o single established caucasian female flare allergies restarted old singulair needs refill working well for her.  Denied black box symptoms.  Discussed warnings.  Left lateral chest distal to axilla lump TTP but no discharge/erythema had mammogram earlier this year normal per patient.  Denied trauma/injury/nipple discharge/fever/chills/rash/bruising/discharge.  Just finished menstrual cycle had breast tenderness.  Didn't notice lump/pain until RN Kimrey pressed on area yesterday.  She thought bra was rubbing/irritating area yesterday.  Dysuria has improved with increasing water intake.     Review of Systems  Constitutional:  Negative for activity change, appetite change, chills, diaphoresis, fatigue and fever.  HENT:  Negative for trouble swallowing and voice change.   Eyes:  Negative for photophobia and visual disturbance.  Respiratory:  Negative for cough, shortness of breath, wheezing and stridor.   Cardiovascular:  Positive for chest pain.  Gastrointestinal:  Negative for nausea and vomiting.  Genitourinary:  Negative for difficulty urinating.  Musculoskeletal:  Negative for gait problem, myalgias, neck pain and neck stiffness.  Allergic/Immunologic: Positive for environmental allergies.  Neurological:  Negative for dizziness, tremors, syncope, facial asymmetry, weakness, light-headedness, numbness and headaches.  Hematological:  Does not bruise/bleed easily.  Psychiatric/Behavioral:  Negative for agitation and confusion.        Objective:   Physical Exam Vitals and nursing note reviewed.  Constitutional:      General: She is awake. She is not in acute distress.    Appearance: Normal appearance. She is well-developed and well-groomed. She is not ill-appearing, toxic-appearing or diaphoretic.  HENT:     Head: Normocephalic and atraumatic.     Jaw: There is normal jaw occlusion.      Salivary Glands: Right salivary gland is not diffusely enlarged. Left salivary gland is not diffusely enlarged.     Right Ear: Hearing and external ear normal. No decreased hearing noted. No laceration, drainage, swelling or tenderness. A middle ear effusion is present. There is no impacted cerumen. No foreign body. No mastoid tenderness. No PE tube. No hemotympanum. Tympanic membrane is bulging. Tympanic membrane is not injected, scarred, perforated, erythematous or retracted.     Left Ear: Hearing and external ear normal. No decreased hearing noted. No laceration, drainage, swelling or tenderness. A middle ear effusion is present. There is no impacted cerumen. No foreign body. No mastoid tenderness. No PE tube. No hemotympanum. Tympanic membrane is bulging. Tympanic membrane is not injected, scarred, perforated, erythematous or retracted.     Nose: Mucosal edema and congestion present. No signs of injury, nasal tenderness or rhinorrhea.     Right Turbinates: Enlarged and swollen. Not pale.     Left Turbinates: Swollen. Not enlarged or pale.     Right Sinus: No maxillary sinus tenderness or frontal sinus tenderness.     Left Sinus: No maxillary sinus tenderness or frontal sinus tenderness.     Comments: Cobblestoning posterior pharynx; bilateral TMs intact air fluid level clear; bilateral allergic shiners; lower eyelids edema nonpitting 1-2+/4 bilaterally; nasal congestion audible nasal turbinates edema erythema clear discharge    Mouth/Throat:     Lips: Pink. No lesions.     Mouth: Mucous membranes are moist. No oral lesions or angioedema.     Dentition: No gum lesions.     Tongue: No lesions. Tongue does not deviate from midline.     Palate: No mass and lesions.     Pharynx: Pharyngeal  swelling and posterior oropharyngeal erythema present.     Tonsils: No tonsillar exudate or tonsillar abscesses. 1+ on the right. 1+ on the left.  Eyes:     General: Lids are normal. Vision grossly intact.  Gaze aligned appropriately. Allergic shiner present. No scleral icterus.       Right eye: No discharge.        Left eye: No discharge.     Extraocular Movements: Extraocular movements intact.     Conjunctiva/sclera: Conjunctivae normal.     Pupils: Pupils are equal, round, and reactive to light.  Neck:     Trachea: Trachea and phonation normal. No tracheal deviation.  Cardiovascular:     Rate and Rhythm: Normal rate and regular rhythm.     Pulses:          Radial pulses are 2+ on the right side and 2+ on the left side.  Pulmonary:     Effort: Pulmonary effort is normal.     Breath sounds: Normal breath sounds and air entry. No stridor, decreased air movement or transmitted upper airway sounds. No decreased breath sounds, wheezing or rhonchi.     Comments: Spoke full sentences without difficulty; no cough observed in exam room Chest:     Chest wall: Tenderness present. No lacerations, deformity, swelling or crepitus.       Comments: Axilla fossa left palpated without tenderness or enlarged lymph nodes; faint hyperpigmentation noted where deodorant applied left bra not removed for examination Abdominal:     General: Abdomen is flat.  Musculoskeletal:        General: Normal range of motion.     Right shoulder: No swelling, deformity, effusion, laceration, tenderness, bony tenderness or crepitus. Normal range of motion. Normal strength.     Left shoulder: No swelling, deformity, effusion, laceration, tenderness, bony tenderness or crepitus. Normal range of motion. Normal strength.     Right upper arm: No swelling or deformity.     Left upper arm: No swelling, edema, deformity, lacerations, tenderness or bony tenderness.     Right hand: Normal strength. Normal capillary refill.     Left hand: Normal strength. Normal capillary refill.     Cervical back: Normal range of motion and neck supple. No swelling, edema, deformity, erythema, signs of trauma, lacerations, rigidity, spasms,  torticollis, tenderness or crepitus. No pain with movement or muscular tenderness. Normal range of motion.     Thoracic back: Tenderness present. No swelling, edema, deformity, signs of trauma, lacerations, spasms or bony tenderness. Normal range of motion.       Back:  Lymphadenopathy:     Head:     Right side of head: No submandibular or preauricular adenopathy.     Left side of head: No submandibular or preauricular adenopathy.     Cervical: No cervical adenopathy.     Right cervical: No superficial, deep or posterior cervical adenopathy.    Left cervical: No superficial, deep or posterior cervical adenopathy.     Upper Body:     Left upper body: No supraclavicular, pectoral or epitrochlear adenopathy.  Skin:    General: Skin is warm and dry.     Capillary Refill: Capillary refill takes less than 2 seconds.     Coloration: Skin is not ashen, cyanotic, jaundiced, mottled, pale or sallow.     Findings: No abrasion, abscess, acne, bruising, burn, ecchymosis, erythema, signs of injury, laceration, lesion, petechiae, rash or wound. Rash is not crusting, macular, papular, purpuric, pustular, scaling, urticarial or vesicular.  Nails: There is no clubbing.  Neurological:     General: No focal deficit present.     Mental Status: She is alert and oriented to person, place, and time. Mental status is at baseline.     GCS: GCS eye subscore is 4. GCS verbal subscore is 5. GCS motor subscore is 6.     Cranial Nerves: Cranial nerves 2-12 are intact. No cranial nerve deficit, dysarthria or facial asymmetry.     Sensory: Sensation is intact.     Motor: Motor function is intact. No weakness, tremor, atrophy, abnormal muscle tone or seizure activity.     Coordination: Coordination is intact. Coordination normal.     Gait: Gait is intact. Gait normal.     Comments: In/out of chair and on/off exam table without difficulty; gait sure and steady in clinic; bilateral hand grasp equal 5/5  Psychiatric:         Attention and Perception: Attention and perception normal.        Mood and Affect: Mood and affect normal.        Speech: Speech normal.        Behavior: Behavior normal. Behavior is cooperative.        Thought Content: Thought content normal.        Cognition and Memory: Cognition and memory normal.        Judgment: Judgment normal.     Epic reviewed CLINICAL DATA:  One year follow-up for probably benign LEFT breast mass and calcifications.   EXAM: DIGITAL DIAGNOSTIC BILATERAL MAMMOGRAM WITH TOMOSYNTHESIS; ULTRASOUND LEFT BREAST LIMITED   TECHNIQUE: Bilateral digital diagnostic mammography and breast tomosynthesis was performed.; Targeted ultrasound examination of the left breast was performed.   COMPARISON:  Previous exam(s).   ACR Breast Density Category b: There are scattered areas of fibroglandular density.   FINDINGS: Magnified views are performed of calcifications associated with focal mass in the UPPER-OUTER QUADRANT of the LEFT breast. These views show stable coarse dystrophic calcifications associated with partially obscured oval mass, stable in appearance. No new or suspicious findings in either breast.   Targeted ultrasound is performed, showing a circumscribed oval hypoechoic mass associated internal calcifications in the 2:30 o'clock location of the LEFT breast 8 centimeters from the nipple, measuring 0.8 x 0.7 centimeters. There is no associated internal blood flow.   IMPRESSION: Benign appearing calcifications and associated mass in the UPPER-OUTER QUADRANT of the LEFT breast. No mammographic or ultrasound evidence for malignancy.   RECOMMENDATION: Recommend bilateral diagnostic mammogram and breast ultrasound in 1 year to complete follow-up.   I have discussed the findings and recommendations with the patient. If applicable, a reminder letter will be sent to the patient regarding the next appointment.   BI-RADS CATEGORY  3: Probably  benign.     Electronically Signed   By: Nolon Nations M.D.   On: 09/17/2022 08:33  Narrative & Impression  CLINICAL DATA:  One year follow-up for probably benign LEFT breast mass and calcifications.   EXAM: DIGITAL DIAGNOSTIC BILATERAL MAMMOGRAM WITH TOMOSYNTHESIS; ULTRASOUND LEFT BREAST LIMITED   TECHNIQUE: Bilateral digital diagnostic mammography and breast tomosynthesis was performed.; Targeted ultrasound examination of the left breast was performed.   COMPARISON:  Previous exam(s).   ACR Breast Density Category b: There are scattered areas of fibroglandular density.   FINDINGS: Magnified views are performed of calcifications associated with focal mass in the UPPER-OUTER QUADRANT of the LEFT breast. These views show stable coarse dystrophic calcifications associated with partially obscured oval mass, stable  in appearance. No new or suspicious findings in either breast.   Targeted ultrasound is performed, showing a circumscribed oval hypoechoic mass associated internal calcifications in the 2:30 o'clock location of the LEFT breast 8 centimeters from the nipple, measuring 0.8 x 0.7 centimeters. There is no associated internal blood flow.   IMPRESSION: Benign appearing calcifications and associated mass in the UPPER-OUTER QUADRANT of the LEFT breast. No mammographic or ultrasound evidence for malignancy.   RECOMMENDATION: Recommend bilateral diagnostic mammogram and breast ultrasound in 1 year to complete follow-up.   I have discussed the findings and recommendations with the patient. If applicable, a reminder letter will be sent to the patient regarding the next appointment.   BI-RADS CATEGORY  3: Probably benign.     Electronically Signed   By: Nolon Nations M.D.   On: 09/17/2022 08:33         Assessment & Plan:  A-lump in chest and seasonal allergic rhinitis  P-Enlarged lymph node/breast tissue versus Lipoma versus infection; no erythema   exitcare handout lipoma, lymphadenopathy  Reviewed mammogram results in epic after patient departed clinic.  Followed up with her that if no improvement in size/tenderness over the next week recommend imaging/consult with general surgery due to Birads III mammogram showed oval mass and calcification 2:30 position 8cm from nipple.  Discussed alternative courses antibiotics, CBC with annual labs in April, [place].  Discussed if lymphadenopathy after illness typically resolves within 6 weeks.Exitcare handouts lipoma and lymphadenopathy.  Lipomas are benign deposit of fatty cells but can sometimes become painful as they enlarge and elective procedure to remove with general surgery sometimes in office as outpatient with local anesthesia.  Patient verbalized understanding information/instructions, agreed with plan of care and had no further questions at this time.  electronic Rx singulair 10mg  po qhs #90 RF3 sent to her pharmacy of choice.  Patient may use normal saline nasal spray 2 sprays each nostril q2h wa as needed. flonase 78mcg 1 spray each nostril BID #1 RF0.  Patient denied personal or family history of ENT cancer.  OTC antihistamine of choice claritin/zyrtec 10mg  po daily.  Avoid triggers if possible.  Shower prior to bedtime if exposed to triggers.  If allergic dust/dust mites recommend mattress/pillow covers/encasements; washing linens, vacuuming, sweeping, dusting weekly.  Call or return to clinic as needed if these symptoms worsen or fail to improve as anticipated.   Exitcare handouts on allergic rhinitis and sinus rinse given to patient.  Patient verbalized understanding of instructions, agreed with plan of care and had no further questions at this time.  P2:  Avoidance and hand washing.

## 2022-11-17 NOTE — Patient Instructions (Addendum)
Allergic Rhinitis, Adult  Allergic rhinitis is an allergic reaction that affects the mucous membrane inside the nose. The mucous membrane is the tissue that produces mucus. There are two types of allergic rhinitis: Seasonal. This type is also called hay fever and happens only during certain seasons. Perennial. This type can happen at any time of the year. Allergic rhinitis cannot be spread from person to person. This condition can be mild, bad, or very bad. It can develop at any age and may be outgrown. What are the causes? This condition is caused by allergens. These are things that can cause an allergic reaction. Allergens may differ for seasonal allergic rhinitis and perennial allergic rhinitis. Seasonal allergic rhinitis is caused by pollen. Pollen can come from grasses, trees, and weeds. Perennial allergic rhinitis may be caused by: Dust mites. Proteins in a pet's pee (urine), saliva, or dander. Dander is dead skin cells from a pet. Smoke, mold, or car fumes. Remains of or waste from insects such as cockroaches. What increases the risk? You are more likely to develop this condition if you have a family history of allergies or other conditions related to allergies, including: Allergic conjunctivitis. This is irritation and swelling of parts of the eyes and eyelids. Asthma. This condition affects the lungs and makes it hard to breathe. Atopic dermatitis or eczema. This is long term (chronic) irritation and swelling of the skin. Food allergies. What are the signs or symptoms? Symptoms of this condition include: Sneezing or coughing. A stuffy nose (nasal congestion), itchy nose, or nasal discharge. Itchy eyes and tearing of the eyes. A feeling of mucus dripping down the back of your throat (postnasal drip). This may cause a sore throat. Trouble sleeping. Tiredness. Headache. How is this diagnosed? This condition may be diagnosed with your symptoms, your medical history, and a physical  exam. Your health care provider may check for related conditions, such as: Asthma. Pink eye. This is eye swelling and irritation caused by infection (conjunctivitis). Ear infection. Upper respiratory infection. This is an infection in the nose, throat, or upper airways. You may also have tests to find out which allergens cause your symptoms. These may include skin tests or blood tests. How is this treated? There is no cure for this condition, but treatment can help control symptoms. Treatment may include: Taking medicines that block allergy symptoms, such as corticosteroids (anti-inflammatories) and antihistamines. Medicine may be given as a shot, nasal spray, or pill. Avoiding any allergens. Being exposed again and again to tiny amounts of allergens to help you build a defense against allergens (allergenimmunotherapy). This is done if other treatments have not helped. It may include: Allergy shots. These are injected medicines that have small amounts of an allergen in them. Sublingual immunotherapy. This involves taking small doses of a medicine with an allergen in it under your tongue. If these treatments do not work, your provider may prescribe newer, stronger medicines. Follow these instructions at home: Avoiding allergens Find out what you are allergic to and avoid those allergens. These are some things you can do to help avoid allergens: If you have perennial allergies: Replace carpet with wood, tile, or vinyl flooring. Carpet can trap dander and dust. Do not smoke. Do not allow smoking in your home Change your heating and air conditioning filters at least once a month. If you have seasonal allergies, take these steps during allergy season: Keep windows closed as much as possible. Plan outdoor activities when pollen counts are lowest. Check pollen counts before   you plan outdoor activities When coming indoors, change clothing and shower before sitting on furniture or bedding. If you  have a pet in the house that produces allergens: Keep the pet out of the bedroom. Vacuum, sweep, and dust regularly. General instructions Take over-the-counter and prescription medicines only as told by your provider. Drink enough fluid to keep your pee pale yellow. Where to find more information American Academy of Allergy, Asthma & Immunology: aaaai.org Contact a health care provider if: You have a fever. You develop a cough that does not go away. You make high-pitched whistling sounds when you breathe, most often when you breathe out (wheeze). Your symptoms slow you down or stop you from doing your normal activities each day. Get help right away if: You have shortness of breath. This symptom may be an emergency. Get help right away. Call 911. Do not wait to see if the symptoms will go away. Do not drive yourself to the hospital. This information is not intended to replace advice given to you by your health care provider. Make sure you discuss any questions you have with your health care provider. Document Revised: 04/20/2022 Document Reviewed: 04/20/2022 Elsevier Patient Education  [place]. Lymphadenopathy  Lymphadenopathy means that your lymph glands are swollen or larger than normal. Lymph glands, also called lymph nodes, are collections of tissue that filter excess fluid, bacteria, viruses, and waste from your bloodstream. They are part of your body's disease-fighting system (immune system), which protects your body from germs. There may be different causes of lymphadenopathy, depending on where it is in your body. Some types go away on their own. Lymphadenopathy can occur anywhere that you have lymph glands, including these areas: Neck (cervical lymphadenopathy). Chest (mediastinal lymphadenopathy). Lungs (hilar lymphadenopathy). Underarms (axillary lymphadenopathy). Groin (inguinal lymphadenopathy). When your immune system responds to germs, infection-fighting cells  and fluid build up in your lymph glands. This causes some swelling and enlargement. If the lymph nodes do not go back to normal size after you have an infection or disease, your health care provider may do tests. These tests help to monitor your condition and find the reason why the glands are still swollen and enlarged. Follow these instructions at home:  Get plenty of rest. Your health care provider may recommend over-the-counter medicines for pain. Take over-the-counter and prescription medicines only as told by your health care provider. If directed, apply heat to swollen lymph glands as often as told by your health care provider. Use the heat source that your health care provider recommends, such as a moist heat pack or a heating pad. Place a towel between your skin and the heat source. Leave the heat on for 20-30 minutes. Remove the heat if your skin turns bright red. This is especially important if you are unable to feel pain, heat, or cold. You may have a greater risk of getting burned. Check your affected lymph glands every day for changes. Check other lymph gland areas as told by your health care provider. Check for changes such as: More swelling. Sudden increase in size. Redness or pain. Hardness. Keep all follow-up visits. This is important. Contact a health care provider if you have: Lymph glands that: Are still swollen after 2 weeks. Have suddenly gotten bigger or the swelling spreads. Are red, painful, or hard. Fluid leaking from the skin near an enlarged lymph gland. Problems with breathing. A fever, chills, or night sweats. Fatigue. A sore throat. Pain in your abdomen. Weight loss. Get help right away  if you have: Severe pain. Chest pain. Shortness of breath. These symptoms may represent a serious problem that is an emergency. Do not wait to see if the symptoms will go away. Get medical help right away. Call your local emergency services (911 in the [place] Do not drive  yourself to the hospital. Summary Lymphadenopathy means that your lymph glands are swollen or larger than normal. Lymph glands, also called lymph nodes, are collections of tissue that filter excess fluid, bacteria, viruses, and waste from the bloodstream. They are part of your body's disease-fighting system (immune system). Lymphadenopathy can occur anywhere that you have lymph glands. If the lymph nodes do not go back to normal size after you have an infection or disease, your health care provider may do tests to monitor your condition and find the reason why the glands are still swollen and enlarged. Check your affected lymph glands every day for changes. Check other lymph gland areas as told by your health care provider. This information is not intended to replace advice given to you by your health care provider. Make sure you discuss any questions you have with your health care provider. Document Revised: 06/05/2020 Document Reviewed: 06/05/2020 Elsevier Patient Education  [place]. Lipoma  A lipoma is a noncancerous (benign) tumor that is made up of fat cells. This is a very common type of soft-tissue growth. Lipomas are usually found under the skin (subcutaneous). They may occur in any tissue of the body that contains fat. Common areas for lipomas to appear include the back, arms, shoulders, buttocks, and thighs. Lipomas grow slowly, and they are usually painless. Most lipomas do not cause problems and do not require treatment. What are the causes? The cause of this condition is not known. What increases the risk? You are more likely to develop this condition if: You are 42-17 years old. You have a family history of lipomas. What are the signs or symptoms? A lipoma usually appears as a small, round bump under the skin. In most cases, the lump will: Feel soft or rubbery. Not cause pain or other symptoms. However, if a lipoma is located in an area where it pushes on nerves, it  can become painful or cause other symptoms. How is this diagnosed? A lipoma can usually be diagnosed with a physical exam. You may also have tests to confirm the diagnosis and to rule out other conditions. Tests may include: Imaging tests, such as a CT scan or an MRI. Removal of a tissue sample to be looked at under a microscope (biopsy). How is this treated? Treatment for this condition depends on the size of the lipoma and whether it is causing any symptoms. For small lipomas that are not causing problems, no treatment is needed. If a lipoma is bigger or it causes problems, surgery may be done to remove the lipoma. Lipomas can also be removed to improve appearance. Most often, the procedure is done after applying a medicine that numbs the area (local anesthetic). Liposuction may be done to reduce the size of the lipoma before it is removed through surgery, or it may be done to remove the lipoma. Lipomas are removed with this method to limit incision size and scarring. A liposuction tube is inserted through a small incision into the lipoma, and the contents of the lipoma are removed through the tube with suction. Follow these instructions at home: Watch your lipoma for any changes. Keep all follow-up visits. This is important. Where to find more information OrthoInfo:  orthoinfo.aaos.org Contact a health care provider if: Your lipoma becomes larger or hard. Your lipoma becomes painful, red, or increasingly swollen. These could be signs of infection or a more serious condition. Get help right away if: You develop tingling or numbness in an area near the lipoma. This could indicate that the lipoma is causing nerve damage. Summary A lipoma is a noncancerous tumor that is made up of fat cells. Most lipomas do not cause problems and do not require treatment. If a lipoma is bigger or it causes problems, surgery may be done to remove the lipoma. Contact a health care provider if your lipoma becomes  larger or hard, or if it becomes painful, red, or increasingly swollen. These could be signs of infection or a more serious condition. This information is not intended to replace advice given to you by your health care provider. Make sure you discuss any questions you have with your health care provider. Document Revised: 08/29/2021 Document Reviewed: 08/29/2021 Elsevier Patient Education  [place]. How to Perform a Sinus Rinse A sinus rinse is a home treatment that is used to rinse your sinuses with a germ-free (sterile) mixture of salt and water (saline solution). Sinuses are air-filled spaces in your skull that are behind the bones of your face and forehead. They open into your nasal cavity. A sinus rinse can help to clear mucus, dirt, dust, or pollen from your nasal cavity. You may do a sinus rinse when you have a cold, a virus, nasal allergy symptoms, a sinus infection, or stuffiness in your nose or sinuses. What are the risks? A sinus rinse is generally safe and effective. However, there are a few risks, which include: A burning sensation in your sinuses. This may happen if you do not make the saline solution as directed. Be sure to follow all directions when making the saline solution. Nasal irritation. Infection. This may be from unclean supplies or from contaminated water. Infection from contaminated water is rare, but possible. Do not do a sinus rinse if you have had ear or nasal surgery, ear infection, or plugged ears, unless recommended by your health care provider. Supplies needed: Saline solution or powder. Distilled or sterile water to mix with saline powder. You may use boiled and cooled tap water. Boil tap water for 5 minutes; cool until it is lukewarm. Use within 24 hours. Do not use regular tap water to mix with the saline solution. Neti pot or nasal rinse bottle. These supplies release the saline solution into your nose and through your sinuses. Neti pots and nasal  rinse bottles can be purchased at [employer], a health food store, or online. How to perform a sinus rinse  Wash your hands with soap and water for at least 20 seconds. If soap and water are not available, use hand sanitizer. Wash your device according to the directions that came with the product and then dry it. Use the solution that comes with your product or one that is sold separately in stores. Follow the mixing directions on the package to mix with sterile or distilled water. Fill the device with the amount of saline solution noted in the device instructions. Stand by a sink and tilt your head sideways over the sink. Place the spout of the device in your upper nostril (the one closer to the ceiling). Gently pour or squeeze the saline solution into your nasal cavity. The liquid should drain out from the lower nostril if you are not too congested.  While rinsing, breathe through your open mouth. Gently blow your nose to clear any mucus and rinse solution. Blowing too hard may cause ear pain. Turn your head in the other direction and repeat in your other nostril. Clean and rinse your device with clean water and then air-dry it. Talk with your health care provider or pharmacist if you have questions about how to do a sinus rinse. Summary A sinus rinse is a home treatment that is used to rinse your sinuses with a sterile mixture of salt and water (saline solution). You may do a sinus rinse when you have a cold, a virus, nasal allergy symptoms, a sinus infection, or stuffiness in your nose or sinuses. A sinus rinse is generally safe and effective. Follow all instructions carefully. This information is not intended to replace advice given to you by your health care provider. Make sure you discuss any questions you have with your health care provider. Document Revised: 01/27/2021 Document Reviewed: 01/27/2021 Elsevier Patient Education  [place].

## 2022-11-18 LAB — POCT URINALYSIS DIPSTICK
Bilirubin, UA: NEGATIVE
Blood, UA: NEGATIVE
Glucose, UA: NEGATIVE
Ketones, UA: NEGATIVE
Leukocytes, UA: NEGATIVE
Nitrite, UA: NEGATIVE
Protein, UA: NEGATIVE
Spec Grav, UA: >=1.03 — AB (ref 1.010–1.025)
Urobilinogen, UA: 0.2 E.U./dL
pH, UA: 6 (ref 5.0–8.0)

## 2022-11-19 LAB — URINE CULTURE

## 2022-11-24 ENCOUNTER — Encounter: Payer: Self-pay | Admitting: Occupational Medicine

## 2022-11-24 ENCOUNTER — Telehealth: Payer: Self-pay | Admitting: Occupational Medicine

## 2022-11-24 DIAGNOSIS — R222 Localized swelling, mass and lump, trunk: Secondary | ICD-10-CM

## 2022-11-24 NOTE — Telephone Encounter (Signed)
Spoke to patient via telephone and she stated she can feel lump in breast that was seen on last mammogram/ultrasound breast.  This lump she cannot feel but is tender when other people palpate it.  At last office visit felt like shotty lymph node axillary mildly TTP no erythema/fluctuance.  Discussed with patient options [place] to  see if lipoma, do CBC end of month to rule out infection if not resolving typically lymph node enlargement would be resolved within 6 weeks after infection.  Patient asked if antibiotics should be taken and because she is not having any other signs of infection rash, fever, chills, pain I do not recommend at this time.  Patient had covid Nov 2023 and CBC essentially normal at that time epic reviewed.  Last labs Feb 2024 but no CBC.  On lipitor 40mg   Patient has been losing weight for diabetes over the past year.  Patient spoke with mammogram facility and no lump seen in left axilla 09/17/22 images per patient  Patient agreed with plan of care to do labs end of month, continue to monitor site to see if enlarging or changes in symptoms e.g. bruising/rash/changes in skin/swelling/worsening tenderness.  Notify clinic staff or PCM if worsening symptoms ASAP.  Patient agreed with plan of care and had no further questions at this time.

## 2022-11-24 NOTE — Telephone Encounter (Signed)
Patient reports spoke to someone at the mammogram place they said there isn't anything in left arm pit just left breast. Concerned about what NP saw in imaging. Wants to speak to her. Patient hasn't seen PCP or had another mammogram. Reports the lump only tender when press it. NP made aware. Patient reports all urinary issues have resolved.

## 2022-12-17 ENCOUNTER — Other Ambulatory Visit: Payer: No Typology Code available for payment source | Admitting: Occupational Medicine

## 2022-12-17 DIAGNOSIS — E119 Type 2 diabetes mellitus without complications: Secondary | ICD-10-CM

## 2022-12-17 DIAGNOSIS — Z8639 Personal history of other endocrine, nutritional and metabolic disease: Secondary | ICD-10-CM

## 2022-12-17 DIAGNOSIS — R222 Localized swelling, mass and lump, trunk: Secondary | ICD-10-CM

## 2022-12-17 NOTE — Progress Notes (Signed)
Lab drawn tolerated well no issues noted.   

## 2022-12-18 LAB — CBC WITH DIFFERENTIAL/PLATELET
Basophils Absolute: 0.1 10*3/uL (ref 0.0–0.2)
Basos: 1 %
EOS (ABSOLUTE): 0.2 10*3/uL (ref 0.0–0.4)
Eos: 3 %
Hematocrit: 42.9 % (ref 34.0–46.6)
Hemoglobin: 13.8 g/dL (ref 11.1–15.9)
Immature Grans (Abs): 0 10*3/uL (ref 0.0–0.1)
Immature Granulocytes: 0 %
Lymphocytes Absolute: 1.9 10*3/uL (ref 0.7–3.1)
Lymphs: 33 %
MCH: 29.4 pg (ref 26.6–33.0)
MCHC: 32.2 g/dL (ref 31.5–35.7)
MCV: 91 fL (ref 79–97)
Monocytes Absolute: 0.5 10*3/uL (ref 0.1–0.9)
Monocytes: 9 %
Neutrophils Absolute: 3.2 10*3/uL (ref 1.4–7.0)
Neutrophils: 54 %
Platelets: 179 10*3/uL (ref 150–450)
RBC: 4.7 x10E6/uL (ref 3.77–5.28)
RDW: 12.5 % (ref 11.7–15.4)
WBC: 5.9 10*3/uL (ref 3.4–10.8)

## 2022-12-18 LAB — HEMOGLOBIN A1C
Est. average glucose Bld gHb Est-mCnc: 134 mg/dL
Hgb A1c MFr Bld: 6.3 % — ABNORMAL HIGH (ref 4.8–5.6)

## 2022-12-18 LAB — VITAMIN D 25 HYDROXY (VIT D DEFICIENCY, FRACTURES): Vit D, 25-Hydroxy: 24.1 ng/mL — ABNORMAL LOW (ref 30.0–100.0)

## 2022-12-19 ENCOUNTER — Encounter: Payer: Self-pay | Admitting: Registered Nurse

## 2022-12-19 ENCOUNTER — Other Ambulatory Visit: Payer: Self-pay | Admitting: Registered Nurse

## 2022-12-19 DIAGNOSIS — E119 Type 2 diabetes mellitus without complications: Secondary | ICD-10-CM

## 2022-12-19 DIAGNOSIS — E559 Vitamin D deficiency, unspecified: Secondary | ICD-10-CM

## 2022-12-19 MED ORDER — CHOLECALCIFEROL 1.25 MG (50000 UT) PO TABS: 1.0000 | ORAL_TABLET | ORAL | 1 refills | Status: AC

## 2023-01-27 ENCOUNTER — Ambulatory Visit: Payer: No Typology Code available for payment source | Admitting: Occupational Medicine

## 2023-01-27 VITALS — BP 122/78 | HR 63 | Temp 98.4°F | Resp 18

## 2023-01-27 DIAGNOSIS — E119 Type 2 diabetes mellitus without complications: Secondary | ICD-10-CM

## 2023-01-27 LAB — GLUCOSE, POCT (MANUAL RESULT ENTRY): POC Glucose: 124 mg/dl — AB (ref 70–99)

## 2023-01-27 NOTE — Progress Notes (Signed)
Hearts flutter started yesterday times 3-4 10 am. Today only one time at like 10 am.  Jaw pain 2 pm Headache. Increased coffee to 4 cups instead of one as well. Didn't slept well. Hx of ingestion hasn't had GERD meds in while. Hx of heart flutter and stress test in Nov 23. Started Ca left over month. Vit B liquid about 1 1/2 week. No chest pain SHOB. Pain is 1/10 in jaw and head is 2/10. Np made aware. Educated to stop Ca decrease coffee. Check Vit B label stop if greater than 100% of daily recommend.  Educated if symptoms worsening to go to ER to be  Evaluated.

## 2023-01-27 NOTE — Progress Notes (Signed)
Normal nonfasting glucose patient notified via telephone.  Discussed to stop calcium and vitamin D supplement and limit caffeine intake to 1 cup of coffee daily as had multiple servings the past 2 days (up to 4). Discussed calcium levels were normal on previous testing as were vitamin B6 and B12.  Patient denied paresthesias hands/feet.  VSS with RN Kimrey today.  Having some palpitations at 1000 this am today and yesterday and again now before going to clinic to see RN Kimrey.  If symptoms worsen e.g. feels like elephant on chest, shortness of breath, syncope, chest pain patient to go to ER for evaluation/EKG  Will follow up with patient again tomorrow when onsite.  Patient A&Ox3 spoke full sentences without difficulty duration of call 10 minutes.  Patient agreed with plan of care and had no further questions at that time.

## 2023-02-02 ENCOUNTER — Telehealth: Payer: Self-pay | Admitting: Registered Nurse

## 2023-02-02 ENCOUNTER — Encounter: Payer: Self-pay | Admitting: Registered Nurse

## 2023-02-02 NOTE — Telephone Encounter (Signed)
Patient reported she had symptoms on 9 Jun after drinking 3 cups of coffee and had not finished entire last cup and stopped intake for the day.  Denied symptoms yesterday with lower caffeine intake for the day.

## 2023-02-03 DIAGNOSIS — D250 Submucous leiomyoma of uterus: Secondary | ICD-10-CM | POA: Insufficient documentation

## 2023-02-03 DIAGNOSIS — N841 Polyp of cervix uteri: Secondary | ICD-10-CM | POA: Insufficient documentation

## 2023-02-09 NOTE — Telephone Encounter (Signed)
Patient reports stopping Calcium and Vit B. Changing to Decaf Coffee she hasn't had anymore symptoms.

## 2023-02-10 ENCOUNTER — Telehealth: Payer: Self-pay | Admitting: Registered Nurse

## 2023-02-10 ENCOUNTER — Encounter: Payer: Self-pay | Admitting: Registered Nurse

## 2023-02-10 NOTE — Telephone Encounter (Signed)
Notified by RN Bess Kinds patient stopped calcium supplement and B12 she had started on her own and switched to decaffeinated coffee and symptoms have resolved.  Patient to follow up with PCM/clinic staff if reoccurs.  Noted patient had pap and HPV tests 12/30/22 normal negative with women's [facility].  Pelvic US ordered for uterine enlargement and history leiomyoma uterus  results not yet available in epic care everywhere  Colonoscopy with 2 polyps Jan 2024 cecum and transverse.  Cecum no atypia or malignancy and transverse with tubular adenoma  Unable to find recommended next colonoscopy date in Epic.  Will follow up with patient to see if she has received recommendation letter from her provider.  Sleep apnea referral to neurology also pending from [facility]  [facility] has checked stress EKG in the previous year NSR  considering zio patch if palpitations continue.  Had labs 12/29/22 as also having brittle nails  Platelet count was slightly low 149 otherwise normal CBC B12 normal  CMET normal TSH normal

## 2023-03-18 ENCOUNTER — Ambulatory Visit: Payer: No Typology Code available for payment source | Admitting: Registered Nurse

## 2023-03-18 ENCOUNTER — Encounter: Payer: Self-pay | Admitting: Registered Nurse

## 2023-03-18 VITALS — BP 130/71 | HR 68 | Temp 98.2°F | Resp 16

## 2023-03-18 DIAGNOSIS — M545 Low back pain, unspecified: Secondary | ICD-10-CM

## 2023-03-18 NOTE — Telephone Encounter (Signed)
Patient reported all symptoms have resolved since cutting back on caffeine.

## 2023-03-18 NOTE — Patient Instructions (Addendum)
Sciatica Rehab Ask your health care provider which exercises are safe for you. Do exercises exactly as told by your health care provider and adjust them as directed. It is normal to feel mild stretching, pulling, tightness, or discomfort as you do these exercises. Stop right away if you feel sudden pain or your pain gets worse. Do not begin these exercises until told by your health care provider. Stretching and range-of-motion exercises These exercises warm up your muscles and joints and improve the movement and flexibility of your hips and back. These exercises also help to relieve pain, numbness, and tingling. Sciatic nerve glide  Sit in a chair with your head facing down toward your chest. Place your hands behind your back. Let your shoulders slump forward. Slowly straighten one of your legs while you tilt your head back as if you are looking toward the ceiling. Only straighten your leg as far as you can without making your symptoms worse. Hold this position for ____30______ seconds. Slowly return your leg and head back to the starting position. Repeat with your other leg. Repeat _____3_____ times. Complete this exercise __________ times a day. Knee to chest with hip adduction and internal rotation  Lie on your back on a firm surface with both legs straight. Bend one of your knees and move it up toward your chest until you feel a gentle stretch in your lower back and buttock. Then, move your knee toward the shoulder that is on the opposite side from your leg. This is hip adduction and internal rotation. Hold your leg in this position by holding on to the front of your knee. Hold this position for ___30_______ seconds. Slowly return to the starting position. Repeat with your other leg. Repeat ____3______ times. Complete this exercise ___2_______ times a day. Prone extension on elbows  Lie on your abdomen on a firm surface. A bed may be too soft for this exercise. Prop yourself up on your  elbows. Use your arms to help lift your chest up until you feel a gentle stretch in your abdomen and your lower back. This will place some of your body weight on your elbows. If this is uncomfortable, try stacking pillows under your chest. Your hips should stay down, against the surface that you are lying on. Keep your hip and back muscles relaxed. Hold this position for __30________ seconds. Slowly relax your upper body and return to the starting position. Repeat ___3_______ times. Complete this exercise ____2______ times a day. Strengthening exercises These exercises build strength and endurance in your back. Endurance is the ability to use your muscles for a long time, even after they get tired. Pelvic tilt This exercise strengthens the muscles that lie deep in the abdomen. Lie on your back on a firm surface. Bend your knees and keep your feet flat on the surface. Tense your abdominal muscles. Tip your pelvis up toward the ceiling and flatten your lower back into the firm surface. To help with this exercise, you may place a small towel under your lower back and try to push your back into the towel. Hold this position for _____30_____ seconds. Let your muscles relax completely before you repeat this exercise. Repeat _____3_____ times. Complete this exercise _____2_____ times a day. Alternating arm and leg raises  Get on your hands and knees on a firm surface. If you are on a hard floor, you may want to use padding, such as an exercise mat, to cushion your knees. Line up your arms and legs. Your  hands should be directly below your shoulders, and your knees should be directly below your hips. Lift your left leg behind you. At the same time, raise your right arm and straighten it in front of you. Do not lift your leg higher than your hip. Do not lift your arm higher than your shoulder. Keep your abdominal and back muscles tight. Keep your hips facing the ground. Do not arch your back. Keep  your balance carefully, and do not hold your breath. Hold this position for ___30_______ seconds. Slowly return to the starting position. Repeat with your right leg and your left arm. Repeat ___3_______ times. Complete this exercise ____2______ times a day. Posture and body mechanics Good posture and healthy body mechanics can help to relieve stress in your body's tissues and joints. Body mechanics refers to the movements and positions of your body while you do your daily activities. Posture is part of body mechanics. Good posture means: Your spine is in its natural S-curve position (neutral). Your shoulders are pulled back slightly. Your head is not tipped forward. Follow these guidelines to improve your posture and body mechanics in your everyday activities. Standing  When standing, keep your spine neutral and your feet about hip width apart. Keep a slight bend in your knees. Your ears, shoulders, and hips should line up. When you do a task in which you stand in one place for a long time, place one foot up on a stable object that is 2-4 inches (5-10 cm) high, such as a footstool. This helps keep your spine neutral. Sitting  When sitting, keep your spine neutral and keep your feet flat on the floor. Use a footrest, if necessary, and keep your thighs parallel to the floor. Avoid rounding your shoulders, and avoid tilting your head forward. When working at a desk or a computer, keep your desk at a height where your hands are slightly lower than your elbows. Slide your chair under your desk so you are close enough to maintain good posture. When working at a computer, place your monitor at a height where you are looking straight ahead and you do not have to tilt your head forward or downward to look at the screen. Resting  When lying down and resting, avoid positions that are most painful for you. If you have pain with activities such as sitting, bending, stooping, or squatting, lie in a position  in which your body does not bend very much. For example, avoid curling up on your side with your arms and knees near your chest (fetal position). If you have pain with activities such as standing for a long time or reaching with your arms, lie with your spine in a neutral position and bend your knees slightly. Try the following positions: Lying on your side with a pillow between your knees. Lying on your back with a pillow under your knees. Lifting  When lifting objects, keep your feet at least shoulder width apart and tighten your abdominal muscles. Bend your knees and hips and keep your spine neutral. It is important to lift using the strength of your legs, not your back. Do not lock your knees straight out. Always ask for help to lift heavy or awkward objects. This information is not intended to replace advice given to you by your health care provider. Make sure you discuss any questions you have with your health care provider. Document Revised: 11/18/2021 Document Reviewed: 11/18/2021 Elsevier Patient Education  2024 [employer] Acute Back Pain, Adult Acute  back pain is sudden and usually short-lived. It is often caused by an injury to the muscles and tissues in the back. The injury may result from: A muscle, tendon, or ligament getting overstretched or torn. Ligaments are tissues that connect bones to each other. Lifting something improperly can cause a back strain. Wear and tear (degeneration) of the spinal disks. Spinal disks are circular tissue that provide cushioning between the bones of the spine (vertebrae). Twisting motions, such as while playing sports or doing yard work. A hit to the back. Arthritis. You may have a physical exam, lab tests, and imaging tests to find the cause of your pain. Acute back pain usually goes away with rest and home care. Follow these instructions at home: Managing pain, stiffness, and swelling Take over-the-counter and prescription medicines only as  told by your health care provider. Treatment may include medicines for pain and inflammation that are taken by mouth or applied to the skin, or muscle relaxants. Your health care provider may recommend applying ice during the first 24-48 hours after your pain starts. To do this: Put ice in a plastic bag. Place a towel between your skin and the bag. Leave the ice on for 20 minutes, 2-3 times a day. Remove the ice if your skin turns bright red. This is very important. If you cannot feel pain, heat, or cold, you have a greater risk of damage to the area. If directed, apply heat to the affected area as often as told by your health care provider. Use the heat source that your health care provider recommends, such as a moist heat pack or a heating pad. Place a towel between your skin and the heat source. Leave the heat on for 20-30 minutes. Remove the heat if your skin turns bright red. This is especially important if you are unable to feel pain, heat, or cold. You have a greater risk of getting burned. Activity  Do not stay in bed. Staying in bed for more than 1-2 days can delay your recovery. Sit up and stand up straight. Avoid leaning forward when you sit or hunching over when you stand. If you work at a desk, sit close to it so you do not need to lean over. Keep your chin tucked in. Keep your neck drawn back, and keep your elbows bent at a 90-degree angle (right angle). Sit high and close to the steering wheel when you drive. Add lower back (lumbar) support to your car seat, if needed. Take short walks on even surfaces as soon as you are able. Try to increase the length of time you walk each day. Do not sit, drive, or stand in one place for more than 30 minutes at a time. Sitting or standing for long periods of time can put stress on your back. Do not drive or use heavy machinery while taking prescription pain medicine. Use proper lifting techniques. When you bend and lift, use positions that put  less stress on your back: [place] your knees. Keep the load close to your body. Avoid twisting. Exercise regularly as told by your health care provider. Exercising helps your back heal faster and helps prevent back injuries by keeping muscles strong and flexible. Work with a physical therapist to make a safe exercise program, as recommended by your health care provider. Do any exercises as told by your physical therapist. Lifestyle Maintain a healthy weight. Extra weight puts stress on your back and makes it difficult to have good posture. Avoid activities or  situations that make you feel anxious or stressed. Stress and anxiety increase muscle tension and can make back pain worse. Learn ways to manage anxiety and stress, such as through exercise. General instructions Sleep on a firm mattress in a comfortable position. Try lying on your side with your knees slightly bent. If you lie on your back, put a pillow under your knees. Keep your head and neck in a straight line with your spine (neutral position) when using electronic equipment like smartphones or pads. To do this: Raise your smartphone or pad to look at it instead of bending your head or neck to look down. Put the smartphone or pad at the level of your face while looking at the screen. Follow your treatment plan as told by your health care provider. This may include: Cognitive or behavioral therapy. Acupuncture or massage therapy. Meditation or yoga. Contact a health care provider if: You have pain that is not relieved with rest or medicine. You have increasing pain going down into your legs or buttocks. Your pain does not improve after 2 weeks. You have pain at night. You lose weight without trying. You have a fever or chills. You develop nausea or vomiting. You develop abdominal pain. Get help right away if: You develop new bowel or bladder control problems. You have unusual weakness or numbness in your arms or legs. You feel  faint. These symptoms may represent a serious problem that is an emergency. Do not wait to see if the symptoms will go away. Get medical help right away. Call your local emergency services (911 in the [place] Do not drive yourself to the hospital. Summary Acute back pain is sudden and usually short-lived. Use proper lifting techniques. When you bend and lift, use positions that put less stress on your back. Take over-the-counter and prescription medicines only as told by your health care provider, and apply heat or ice as told. This information is not intended to replace advice given to you by your health care provider. Make sure you discuss any questions you have with your health care provider. Document Revised: 11/01/2020 Document Reviewed: 11/01/2020 Elsevier Patient Education  2024 [employer].

## 2023-03-18 NOTE — Progress Notes (Signed)
Subjective:    Patient ID: Danielle Cooke, female    DOB: [birth year], 47 y.o.   MRN: [id number]  47y/o caucasian single female established here for evaluation back pain low midline and sometimes more on right side. Denied known injury or trauma.  Not radiating into legs like it sometimes does with her sciatica.  Has biofreeze at home but hasn't tried using it.   Has noticed urine darker denied burning with urination.  Having some spotting between menses this month also noted on wiping today.  Denied loss of bowel bladder control, saddle paresthesias or arm/leg weakness.  Has tried stretching and ibuprofen.  Has been spotting this week unsure if related to menstrual cycle.  History of sciatica.  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  Denied new exercise routine or flexing to another dept recently at work.  Has appt upcoming for procedure regarding her uterine fibroids.      Review of Systems  Constitutional:  Negative for chills and fever.  HENT:  Negative for trouble swallowing and voice change.   Respiratory:  Negative for cough, shortness of breath, wheezing and stridor.   Cardiovascular:  Negative for chest pain.  Gastrointestinal:  Negative for diarrhea, nausea and vomiting.  Skin:  Negative for rash and wound.  Hematological:  Negative for adenopathy. Does not bruise/bleed easily.  Psychiatric/Behavioral:  Negative for agitation, confusion and sleep disturbance.        Objective:   Physical Exam Vitals and nursing note reviewed.  Constitutional:      General: She is awake. She is not in acute distress.    Appearance: Normal appearance. She is well-developed and well-groomed. She is obese. She is not ill-appearing, toxic-appearing or diaphoretic.  HENT:     Head: Normocephalic and atraumatic.     Jaw: There is normal jaw occlusion.     Salivary Glands: Right salivary gland  is not diffusely enlarged. Left salivary gland is not diffusely enlarged.     Right Ear: Hearing and external ear normal.     Left Ear: Hearing and external ear normal.     Nose: Nose normal. No congestion or rhinorrhea.     Mouth/Throat:     Lips: Pink. No lesions.     Mouth: Mucous membranes are moist.     Pharynx: Oropharynx is clear.  Eyes:     General: Lids are normal. Vision grossly intact. Gaze aligned appropriately. No scleral icterus.       Right eye: No discharge.        Left eye: No discharge.     Extraocular Movements: Extraocular movements intact.     Conjunctiva/sclera: Conjunctivae normal.     Pupils: Pupils are equal, round, and reactive to light.  Neck:     Trachea: Trachea and phonation normal.  Cardiovascular:     Rate and Rhythm: Normal rate and regular rhythm.     Pulses: Normal pulses.          Radial pulses are 2+ on the right side and 2+ on the left side.  Pulmonary:     Effort: Pulmonary effort is normal.     Breath sounds: Normal breath sounds and air entry. No stridor or transmitted upper airway sounds. No wheezing.     Comments: Spoke full sentences without difficulty; no cough observed in exam room Abdominal:     General: Abdomen is flat.  Musculoskeletal:  General: No swelling, tenderness, deformity or signs of injury.     Right hand: Normal strength. Normal capillary refill.     Left hand: Normal strength. Normal capillary refill.     Cervical back: Normal range of motion and neck supple. No swelling, edema, deformity, erythema, signs of trauma, lacerations, rigidity, spasms, torticollis, tenderness, bony tenderness or crepitus. No pain with movement or muscular tenderness. Normal range of motion.     Thoracic back: No swelling, edema, deformity, signs of trauma, lacerations, spasms, tenderness or bony tenderness. Normal range of motion. No scoliosis.     Lumbar back: No swelling, edema, deformity, signs of trauma, lacerations, spasms, tenderness  or bony tenderness. Decreased range of motion. No scoliosis.       Back:     Comments: Pain worsens with right rotation, left lateral bending (AROM not equal to opposite direction) decreased 25% and improves with flexion can touch ankles.  Unchanged with extension, right lateral bending or left rotation. Normal heel toe gait lower and upper extremity strength 5/5; no muscle spasms palpation SI joints not TTP  Lymphadenopathy:     Head:     Right side of head: No submandibular or preauricular adenopathy.     Left side of head: No submandibular or preauricular adenopathy.     Cervical: No cervical adenopathy.     Right cervical: No superficial cervical adenopathy.    Left cervical: No superficial cervical adenopathy.  Skin:    General: Skin is warm and dry.     Capillary Refill: Capillary refill takes less than 2 seconds.     Coloration: Skin is not ashen, cyanotic, jaundiced, mottled, pale or sallow.     Findings: No abrasion, abscess, acne, bruising, burn, ecchymosis, erythema, signs of injury, laceration, lesion, petechiae, rash or wound.     Nails: There is no clubbing.  Neurological:     General: No focal deficit present.     Mental Status: She is alert and oriented to person, place, and time. Mental status is at baseline.     GCS: GCS eye subscore is 4. GCS verbal subscore is 5. GCS motor subscore is 6.     Cranial Nerves: Cranial nerves 2-12 are intact. No cranial nerve deficit, dysarthria or facial asymmetry.     Motor: Motor function is intact. No weakness, tremor, atrophy, abnormal muscle tone or seizure activity.     Coordination: Coordination is intact. Coordination normal.     Gait: Gait is intact. Gait normal.     Comments: In/out of chair without difficulty; gait sure and steady in clinic; bilateral hand grasp equal 5/5  Psychiatric:        Attention and Perception: Attention and perception normal.        Mood and Affect: Mood and affect normal.        Speech: Speech normal.         Behavior: Behavior normal. Behavior is cooperative.        Thought Content: Thought content normal.        Cognition and Memory: Cognition and memory normal.        Judgment: Judgment normal.           Assessment & Plan:   A-acute midline low back pain without sciatica  P-poct urinalysis patient unable to produce specimen prior to clinic closing.  Given wipe and specimen cup and will bring sample to RN Kimrey 03/22/23 if back pain still present.  May continue Ibuprofen 800mg  po TID prn pain take  with food  Has biofreeze at home for prn use.   Home stretches continue cat/cow and exercises per sciatica rehab handout.  Discussed may be related to fibroids uterine/posture strain on spinal column.  No muscle spasms palpated today.  Gentle arom, knee to chest and rock side to side on back. Self massage or professional prn, foam roller use or tennis/racquetball.  Heat/cryotherapy 15 minutes QID prn.  Trial thermacare 1 given to patient from clinic stock.  Consider epsom salt bath tonight.  If no improvement with heat trial ice.  Discussed prolonged static positions usually worsen pain along with bad posture/ergonomics.  Not a known work injury.  Consider physical therapy referral if no improvement with prescribed therapy from [facility] and/or chiropractic care.  Ensure ergonomics correct desk at work avoid repetitive motions if possible/holding phone/laptop in hand use desk/stand and/or break up lifting items into smaller loads/weights.  Patient was instructed to rest, ice, and ROM exercises.  Activity as tolerated.   Follow up if symptoms persist or worsen especially if loss of bowel/bladder control, arm/leg weakness and/or saddle paresthesias same day re-evaluation with a provider.  [facility]care handout on acute back pain and sciatica rehab exercises.  Patient verbalized agreement and understanding of treatment plan and had no further questions at this time.  P2:  Injury Prevention and Fitness.

## 2023-03-20 ENCOUNTER — Other Ambulatory Visit: Payer: Self-pay | Admitting: Registered Nurse

## 2023-03-20 DIAGNOSIS — E119 Type 2 diabetes mellitus without complications: Secondary | ICD-10-CM

## 2023-03-20 DIAGNOSIS — R7989 Other specified abnormal findings of blood chemistry: Secondary | ICD-10-CM

## 2023-03-20 NOTE — Progress Notes (Signed)
Per epic review patient read results note 12/22/22 new national guidelines received for vitamin D and [facility] will no longer be drawing levels  my chart message sent to patient; noted patient due for Hgba1c recheck in October and not yet scheduled reminder sent to patient along with weight check due this week with RN Kimrey.  Danielle Cooke,  Vitamin D levels are no longer going to be drawn at [facility] due to new national guidelines.  Finish your vitamin D 50,000 units once a week with meal rx then start 2000 units by mouth daily with meal over the counter.  Reminder to see RN Bess Kinds for weight recheck this week and schedule your Hgba1c recheck after 25 May 2023.  Please let me know if you have further questions.  Sincerely,  Albina Billet NP-C

## 2023-03-31 DIAGNOSIS — N840 Polyp of corpus uteri: Secondary | ICD-10-CM | POA: Insufficient documentation

## 2023-04-19 ENCOUNTER — Telehealth: Payer: Self-pay

## 2023-04-19 DIAGNOSIS — L309 Dermatitis, unspecified: Secondary | ICD-10-CM

## 2023-04-19 MED ORDER — TRIAMCINOLONE ACETONIDE 0.1 % EX CREA: 1.0000 | TOPICAL_CREAM | Freq: Two times a day (BID) | CUTANEOUS | 0 refills | Status: DC

## 2023-04-21 NOTE — Telephone Encounter (Signed)
Spoke with patient reported rash on thigh reoccurred and had run out of cream traimcinolon 0.1% requesting refill  Electronic Rx sent to her pharmacy of choice 30gram RF0.  Patient stated it had resolved for a while.  Patient to follow up for re-evaluation if worsening/not clearing with treatment topical application twice a day to rash for 2 weeks and apply emollient after bathing.  Avoid hot/steamy showers/baths.  Patient agreed with plan of care and had no further questions at this time.

## 2023-04-22 DIAGNOSIS — R21 Rash and other nonspecific skin eruption: Secondary | ICD-10-CM | POA: Insufficient documentation

## 2023-04-27 IMAGING — MG MM DIGITAL SCREENING BILAT W/ TOMO AND CAD
8 series · 8 of 24 positions shown · non-contrast
Comparison: Previous exam(s).

CLINICAL DATA: Screening.

EXAM:
DIGITAL SCREENING BILATERAL MAMMOGRAM WITH TOMOSYNTHESIS AND CAD
TECHNIQUE: Bilateral screening digital craniocaudal and mediolateral oblique
mammograms were obtained. Bilateral screening digital breast
tomosynthesis was performed. The images were evaluated with
computer-aided detection.

[L CC synth-2D]
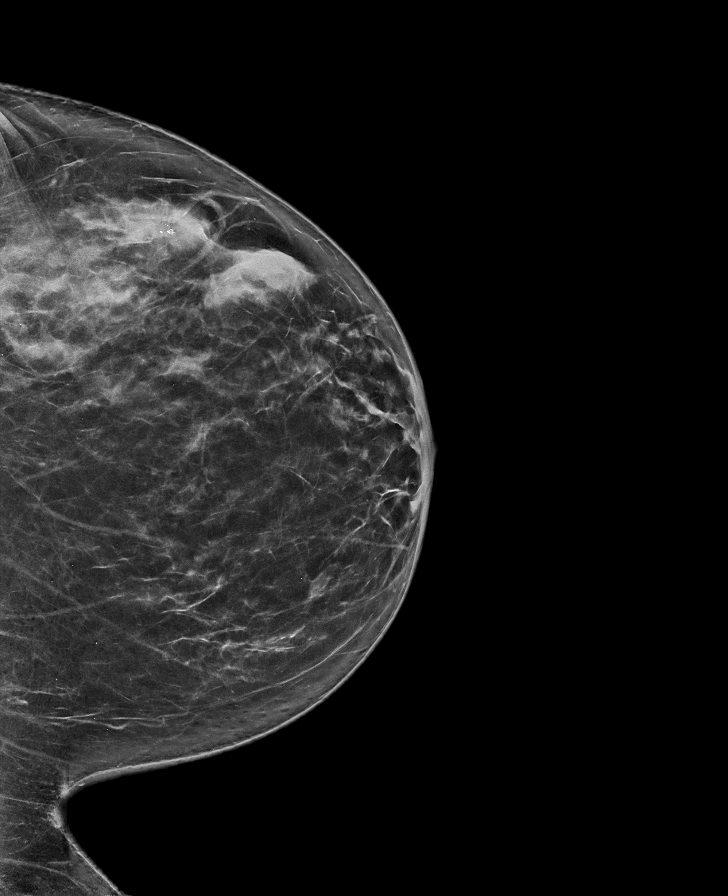

[R CC synth-2D]
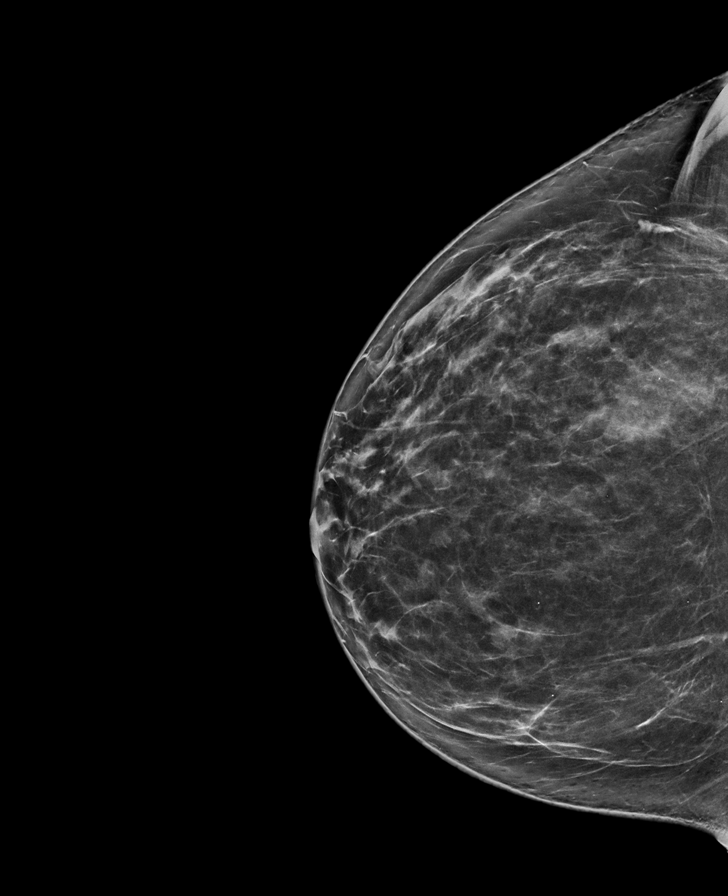

[R MLO synth-2D]
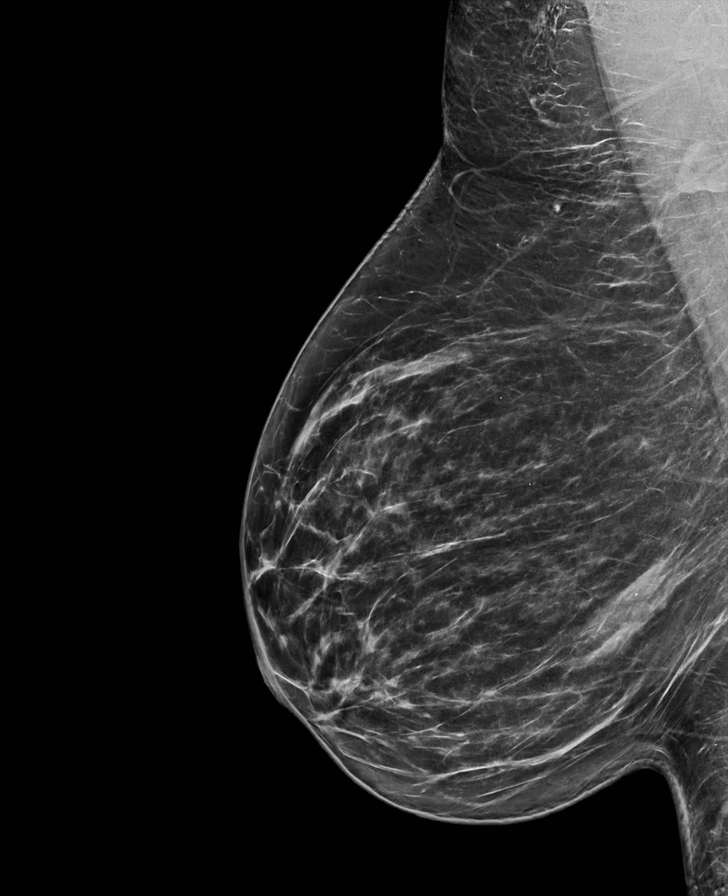

[L MLO synth-2D]
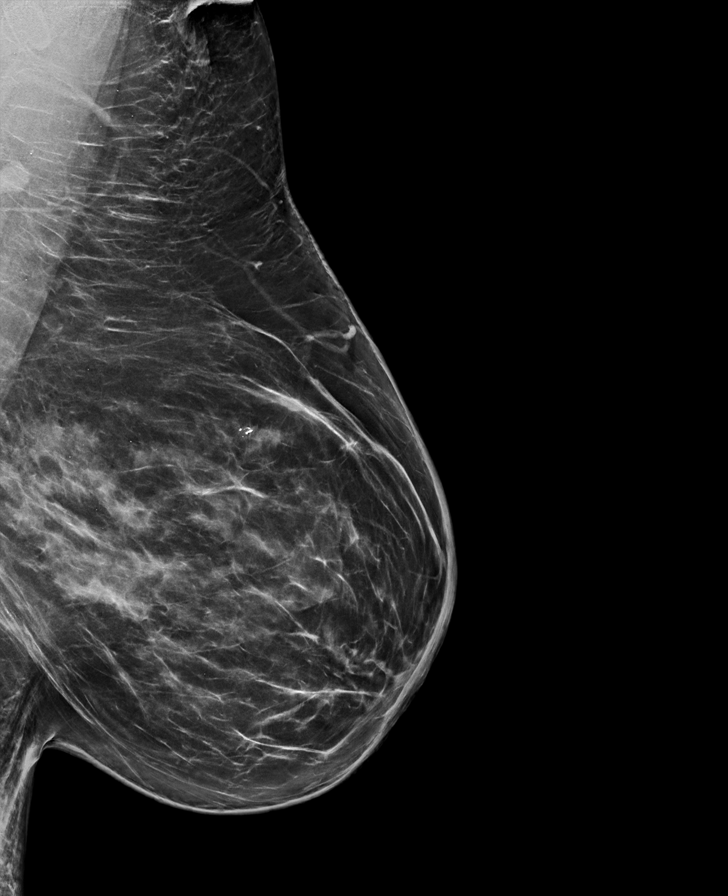

[L CC tomo · tomo slice 38/75.0]
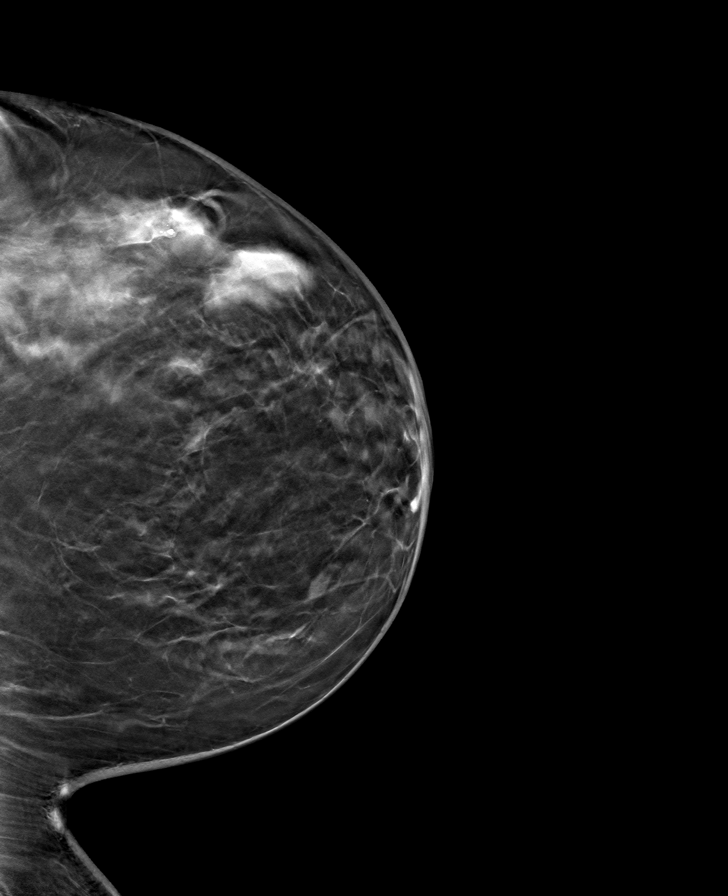

[L MLO tomo · tomo slice 45/88.0]
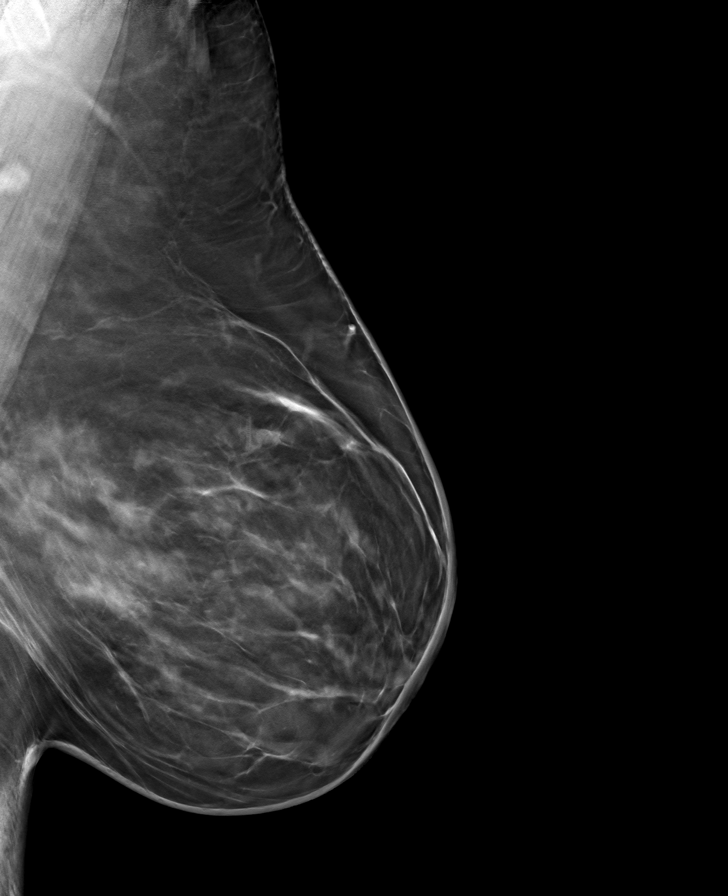

[R CC tomo · tomo slice 39/78.0]
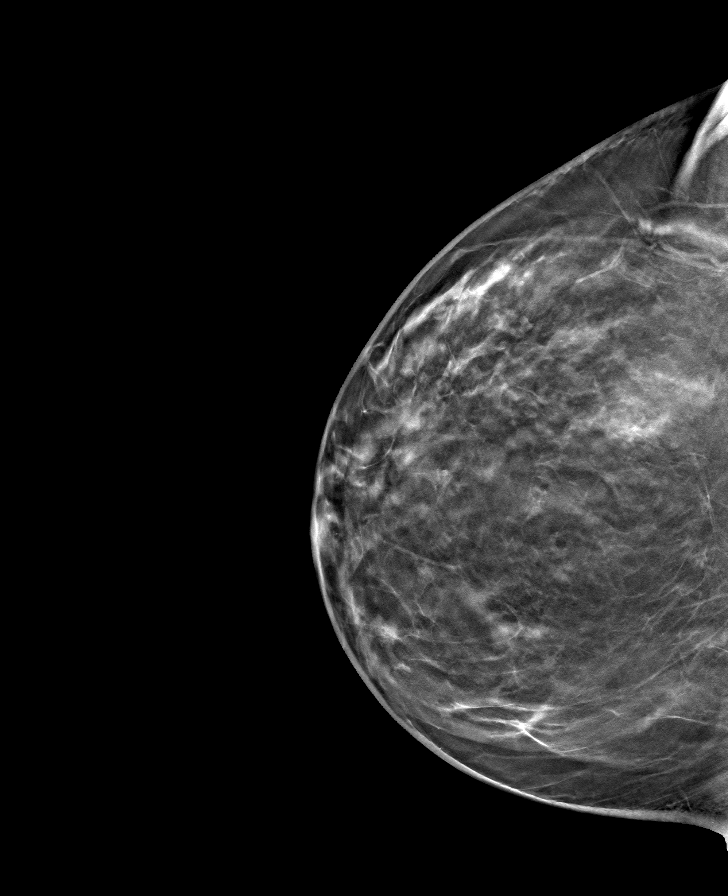

[R MLO tomo · tomo slice 43/84.0]
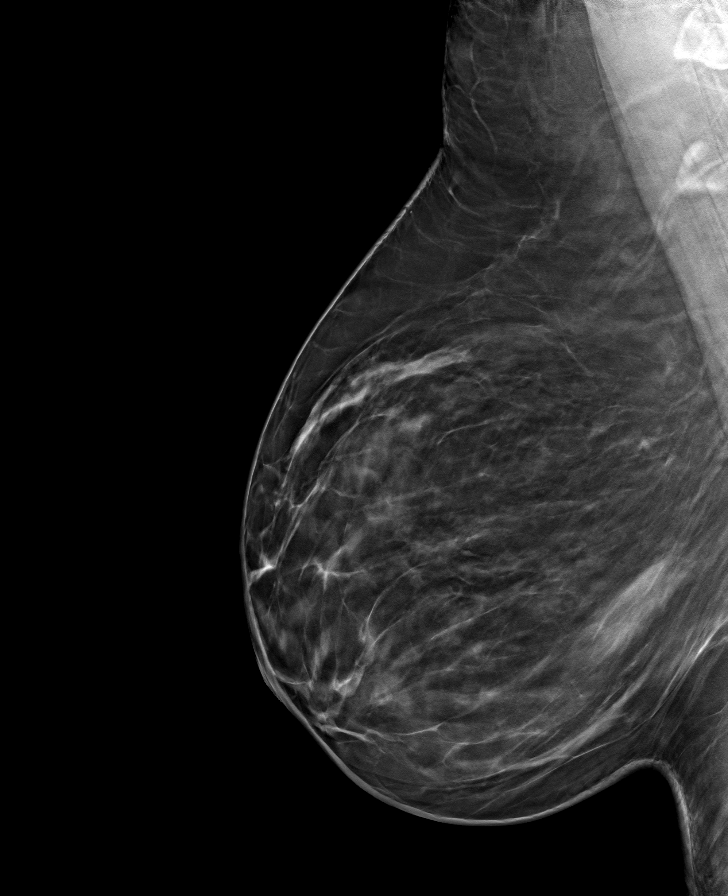

[8 of 24 positions shown; findings below may reference images not displayed]

ACR Breast Density Category c: The breast tissue is heterogeneously
dense, which may obscure small masses.
FINDINGS: In the left breast, calcifications warrant further evaluation. In
the right breast, no findings suspicious for malignancy.
IMPRESSION: Further evaluation is suggested for calcifications in the left
breast.

RECOMMENDATION:
Diagnostic mammogram of the left breast. (Code:AS-B-FFA)

The patient will be contacted regarding the findings, and additional
imaging will be scheduled.

BI-RADS CATEGORY  0: Incomplete. Need additional imaging evaluation
and/or prior mammograms for comparison.

## 2023-05-20 IMAGING — US US BREAST*L* LIMITED INC AXILLA
1 series · 13 of 21 positions shown · non-contrast
Comparison: Previous exam(s).

CLINICAL DATA: Recall from screening mammography, calcifications
involving the UPPER OUTER QUADRANT of the LEFT breast at middle to
posterior depth.

EXAM:
DIGITAL DIAGNOSTIC UNILATERAL LEFT MAMMOGRAM WITH CAD; ULTRASOUND
LEFT BREAST LIMITED
TECHNIQUE: Left digital diagnostic mammography was performed. Mammographic
images were processed with CAD.; Targeted ultrasound examination of
the left breast was performed.

[Series 1: us breast*left* limited inc axilla · 0.06mm/px · 13 of 21 slices shown]
[im 1/21]
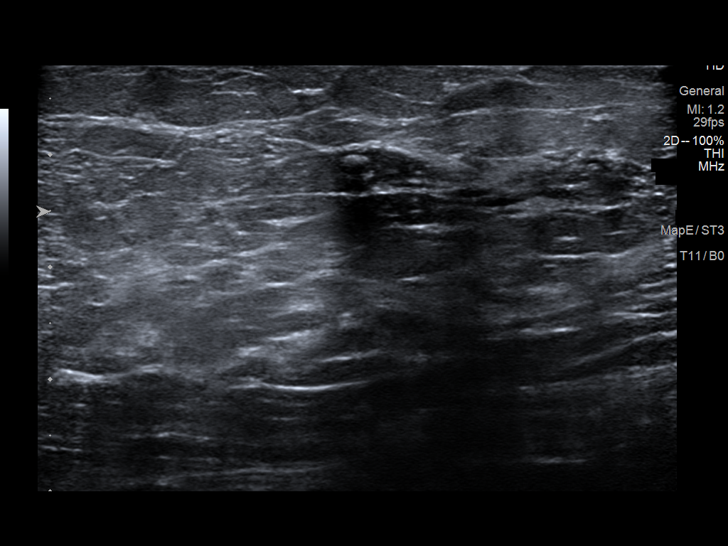
[im 3/21]
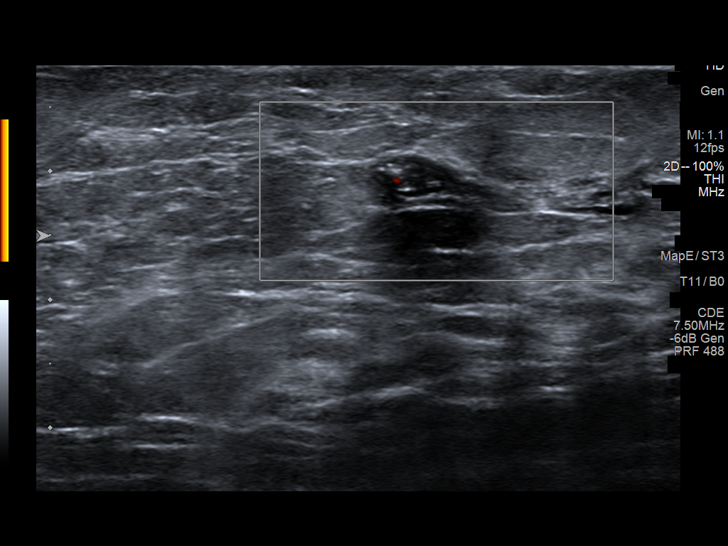
[im 5/21]
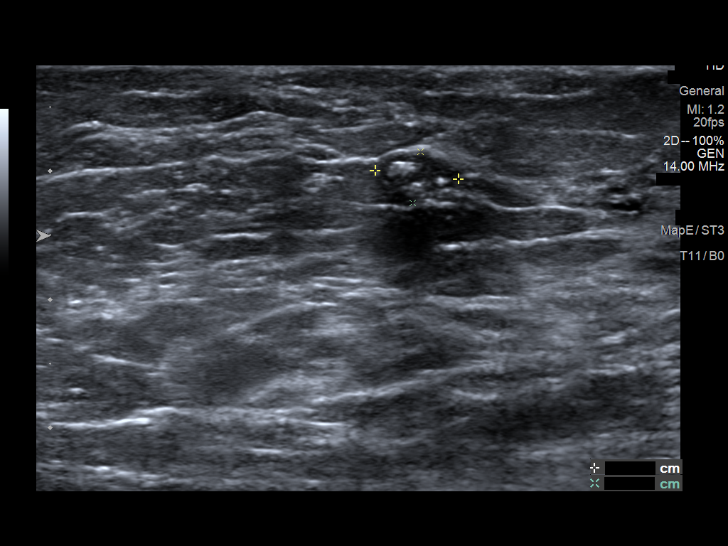
[im 6/21]
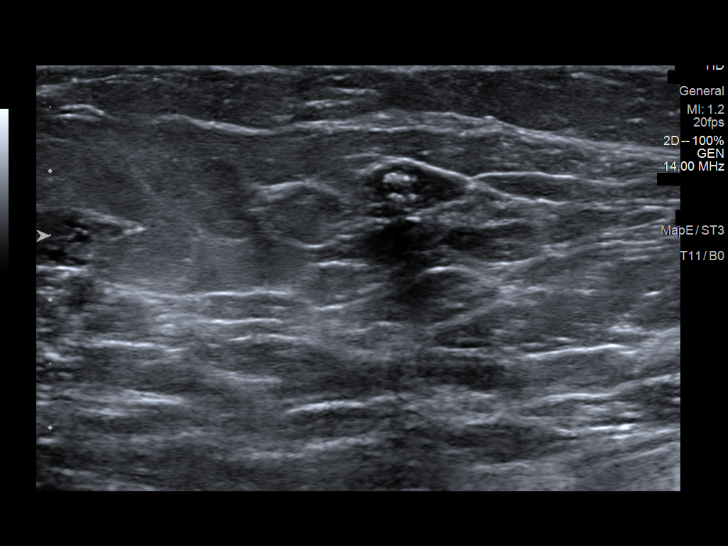
[im 8/21]
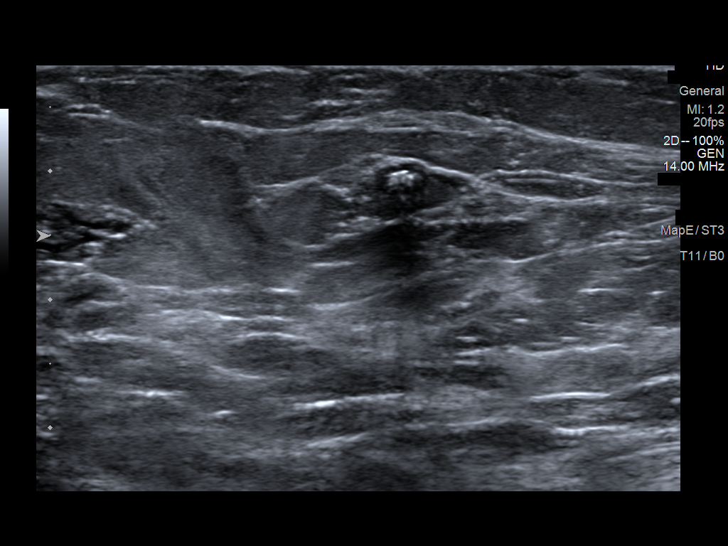
[im 9/21]
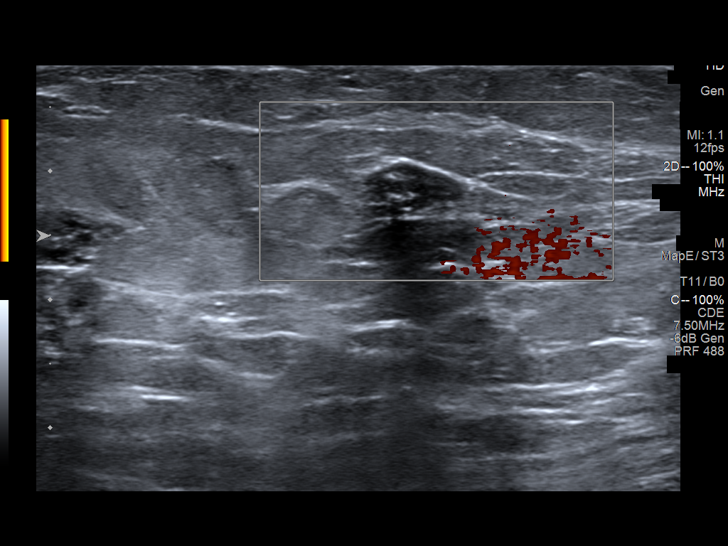
[im 11/21]
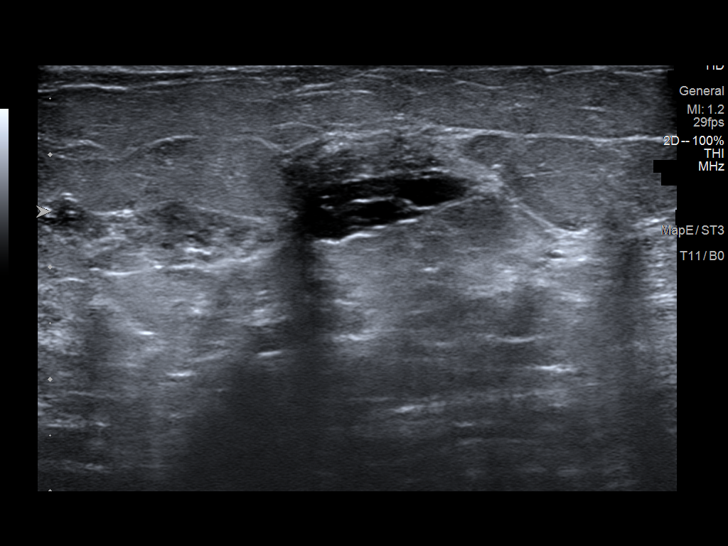
[im 13/21]
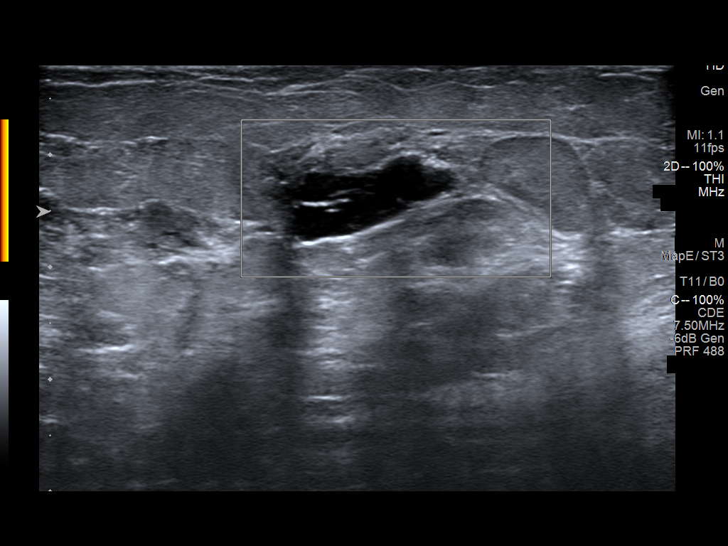
[im 14/21]
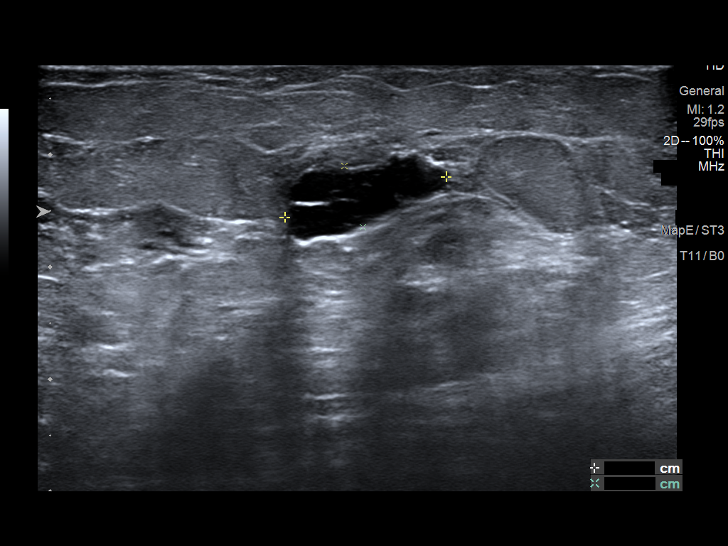
[im 16/21]
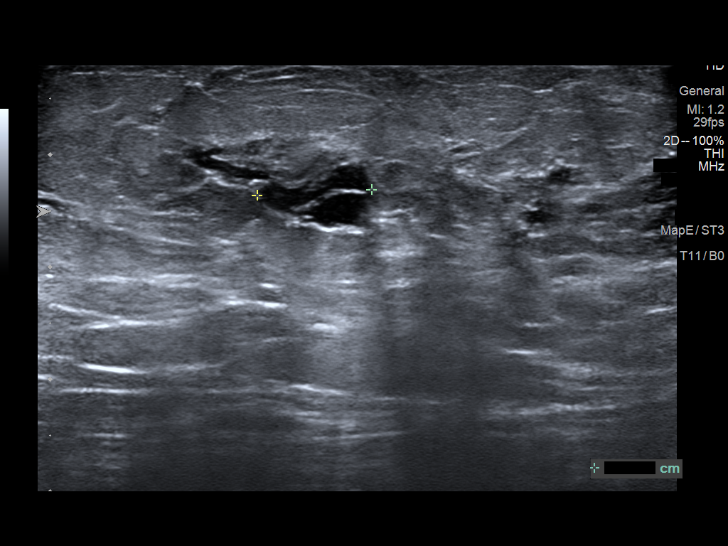
[im 17/21]
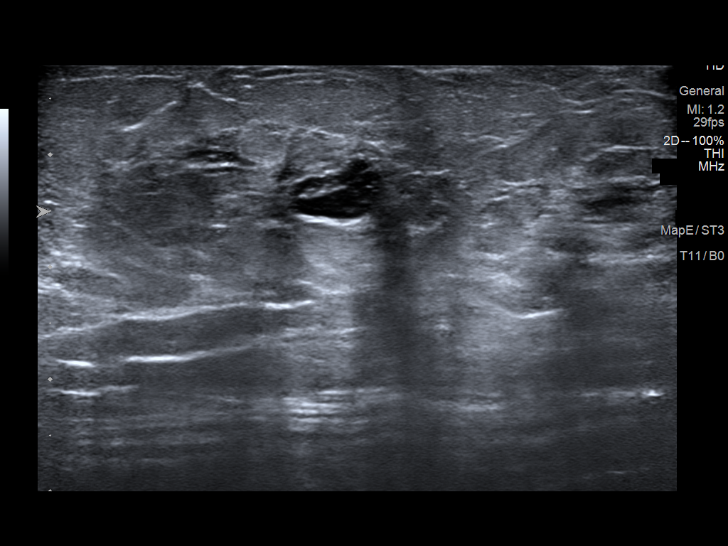
[im 19/21]
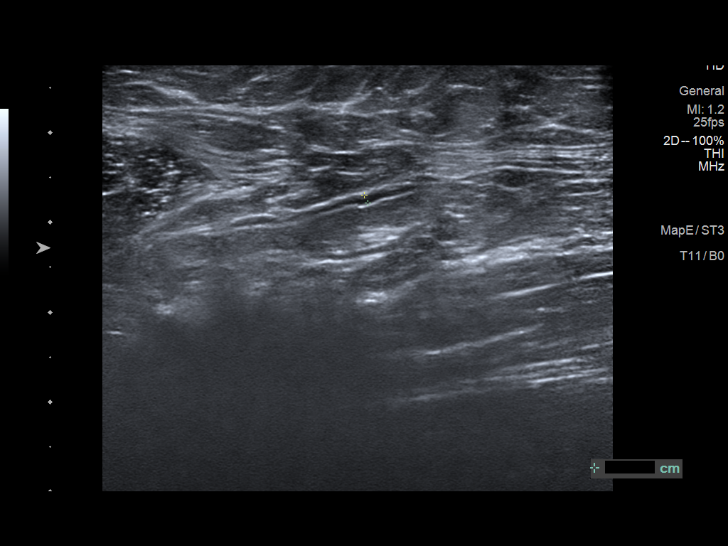
[im 21/21]
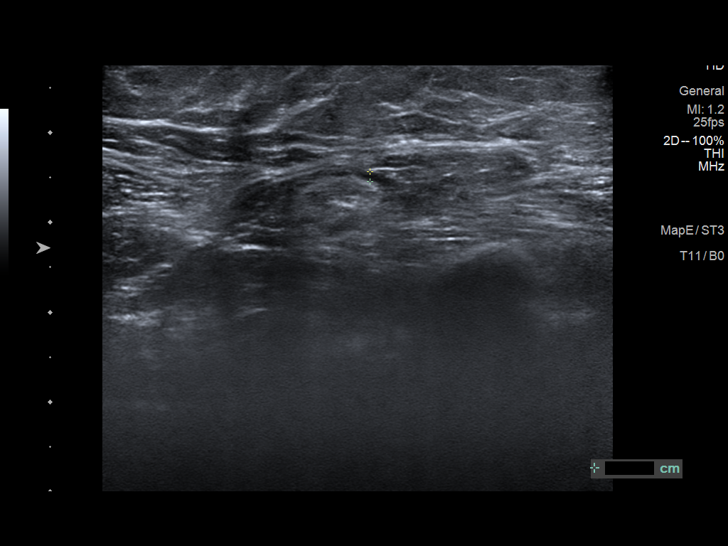

[13 of 21 positions shown; findings below may reference images not displayed]

ACR Breast Density Category c: The breast tissue is heterogeneously
dense, which may obscure small masses.
FINDINGS: Spot magnification CC and mediolateral views of the calcifications
and a full field mediolateral view were obtained.

Spot magnification images confirm a 4-5 mm group of dystrophic
appearing calcifications which may be associated with an isodense
mass; the possible mass is more conspicuous on the screening
tomosynthesis images. There are no suspicious linear or branching
forms.

In an attempt to identify a possible mass, targeted ultrasound is
performed, demonstrating an oval circumscribed parallel hypoechoic
mass containing coarse calcifications at the 2:30 o'clock position 8
cm from the nipple measuring approximately 0.8 x 0.4 x 0.7 cm,
demonstrating mixed posterior characteristics (including shadowing
due to the coarse calcifications), demonstrating internal power
Doppler flow, corresponding to the screening mammographic finding.

Incidentally identified at the 4 o'clock position 4 cm from the
nipple is an anechoic mass with a thin internal septation and
scattered internal echoes (versus clustered cysts) measuring
approximately 1.5 x 0.6 x 1.0 cm, demonstrating posterior acoustic
enhancement and no internal power Doppler flow.

Sonographic evaluation of the LEFT axilla demonstrates no pathologic
lymphadenopathy.
IMPRESSION: 1. Likely benign 0.8 cm degenerating fibroadenoma associated with
coarse dystrophic calcifications in the UPPER OUTER QUADRANT of the
LEFT breast at 2:30 o'clock 8 cm from the nipple which accounts for
the screening mammographic finding.
2. Likely benign complicated cyst or clustered cysts in the LOWER
OUTER QUADRANT of the LEFT breast at 4 o'clock position 4 cm from
the nipple.
3. No pathologic LEFT axillary lymphadenopathy.

RECOMMENDATION:
We discussed management options for the likely benign fibroadenoma
including excision, ultrasound-guided core biopsy, and short term
interval follow-up. Follow-up ultrasound is recommended at 6, 12 and
24 months to assess stability. The patient agrees with this plan.

I have discussed the findings and recommendations with the patient.
If applicable, a reminder letter will be sent to the patient
regarding the next appointment.

BI-RADS CATEGORY  3: Probably benign.

## 2023-05-20 IMAGING — MG DIGITAL DIAGNOSTIC UNILAT LEFT W/ CAD
3 series · 3 of 3 positions shown · non-contrast
Comparison: Previous exam(s).

CLINICAL DATA: Recall from screening mammography, calcifications
involving the UPPER OUTER QUADRANT of the LEFT breast at middle to
posterior depth.

EXAM:
DIGITAL DIAGNOSTIC UNILATERAL LEFT MAMMOGRAM WITH CAD; ULTRASOUND
LEFT BREAST LIMITED
TECHNIQUE: Left digital diagnostic mammography was performed. Mammographic
images were processed with CAD.; Targeted ultrasound examination of
the left breast was performed.

[L CC]
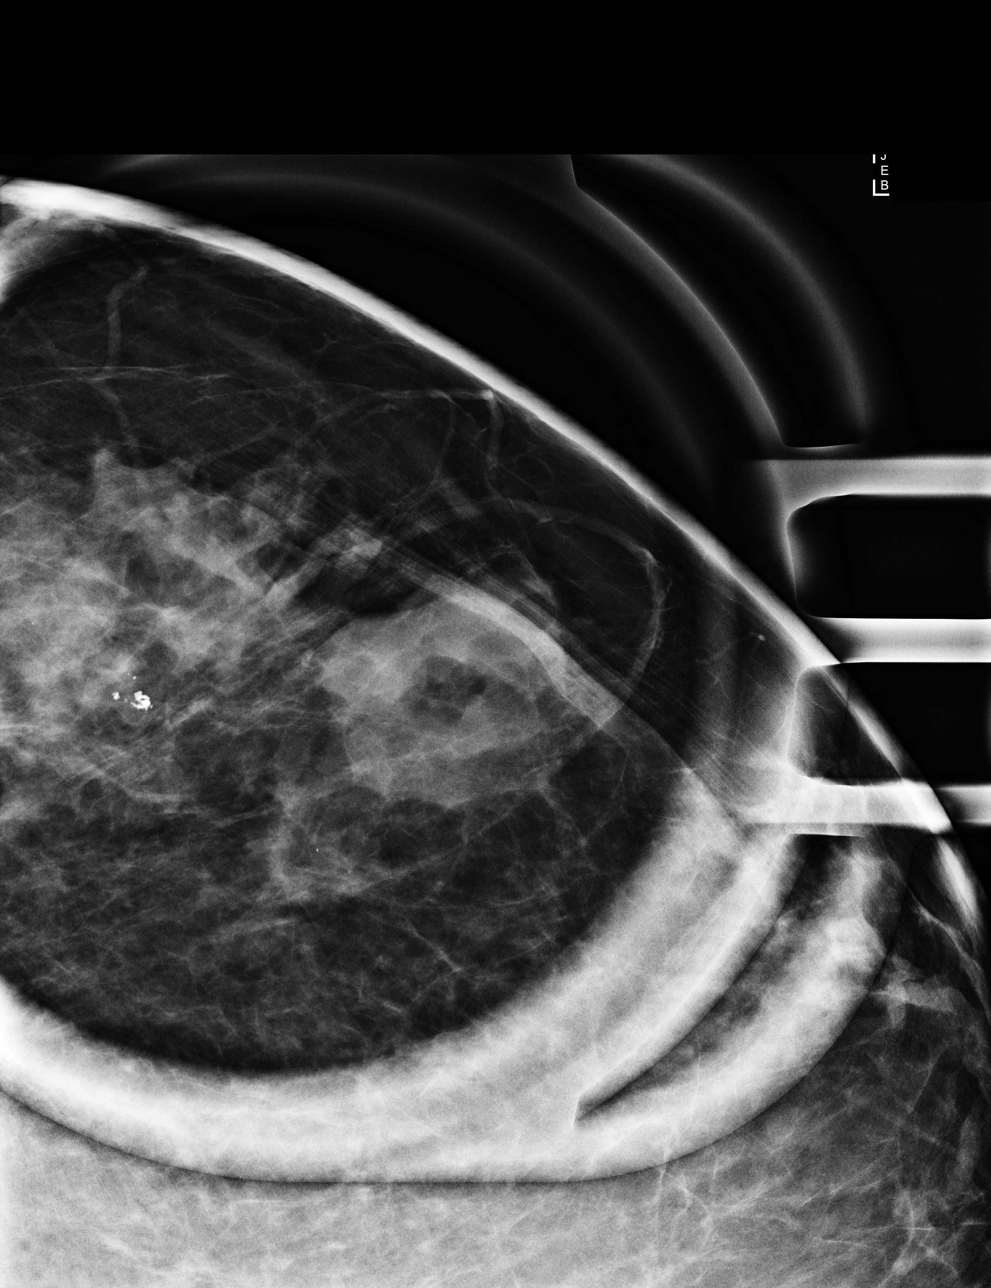

[L ML (1 of 2)]
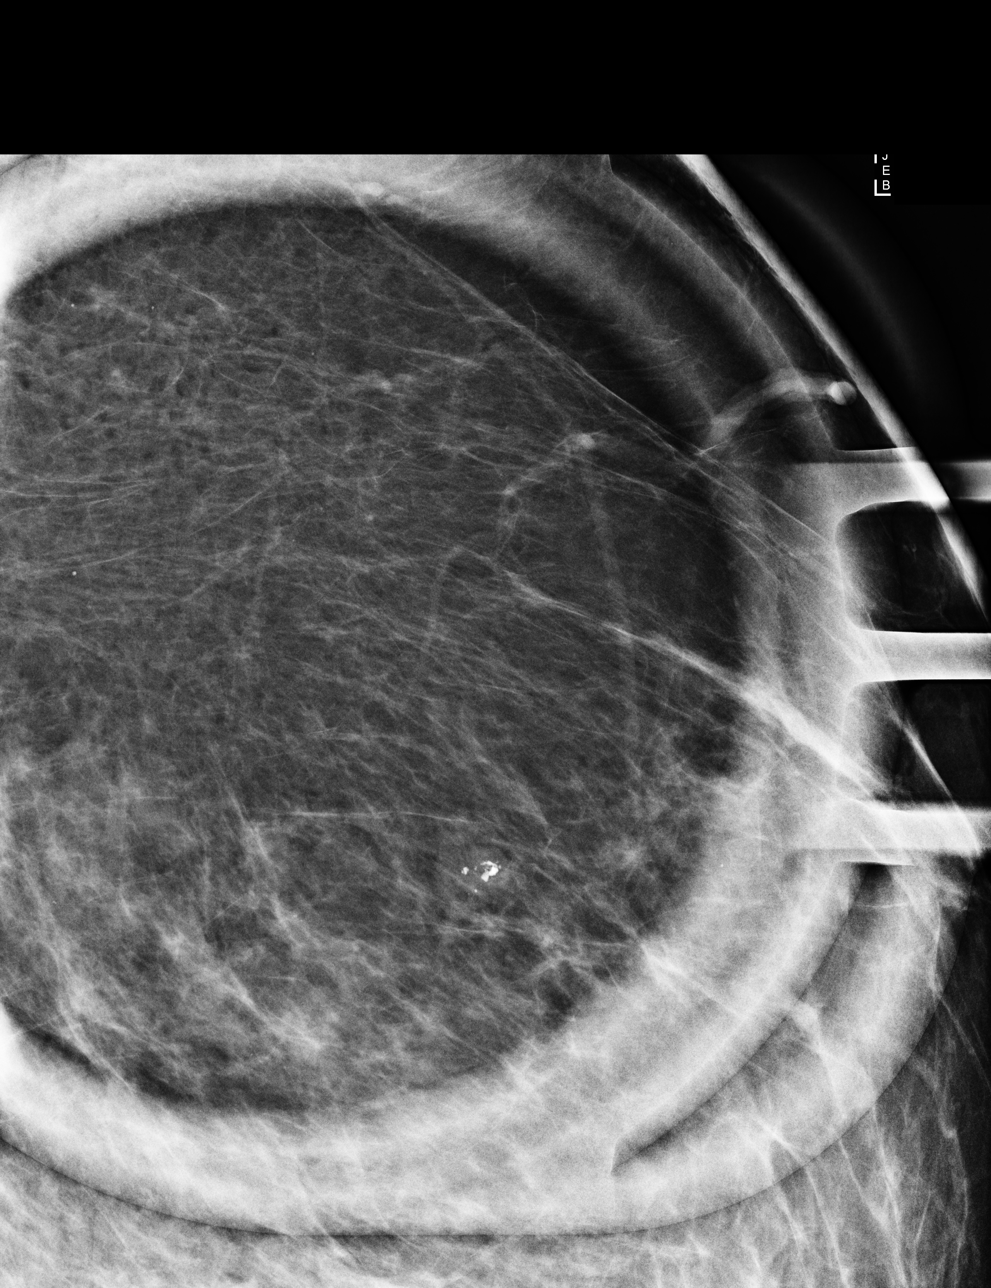

[L ML (2 of 2)]
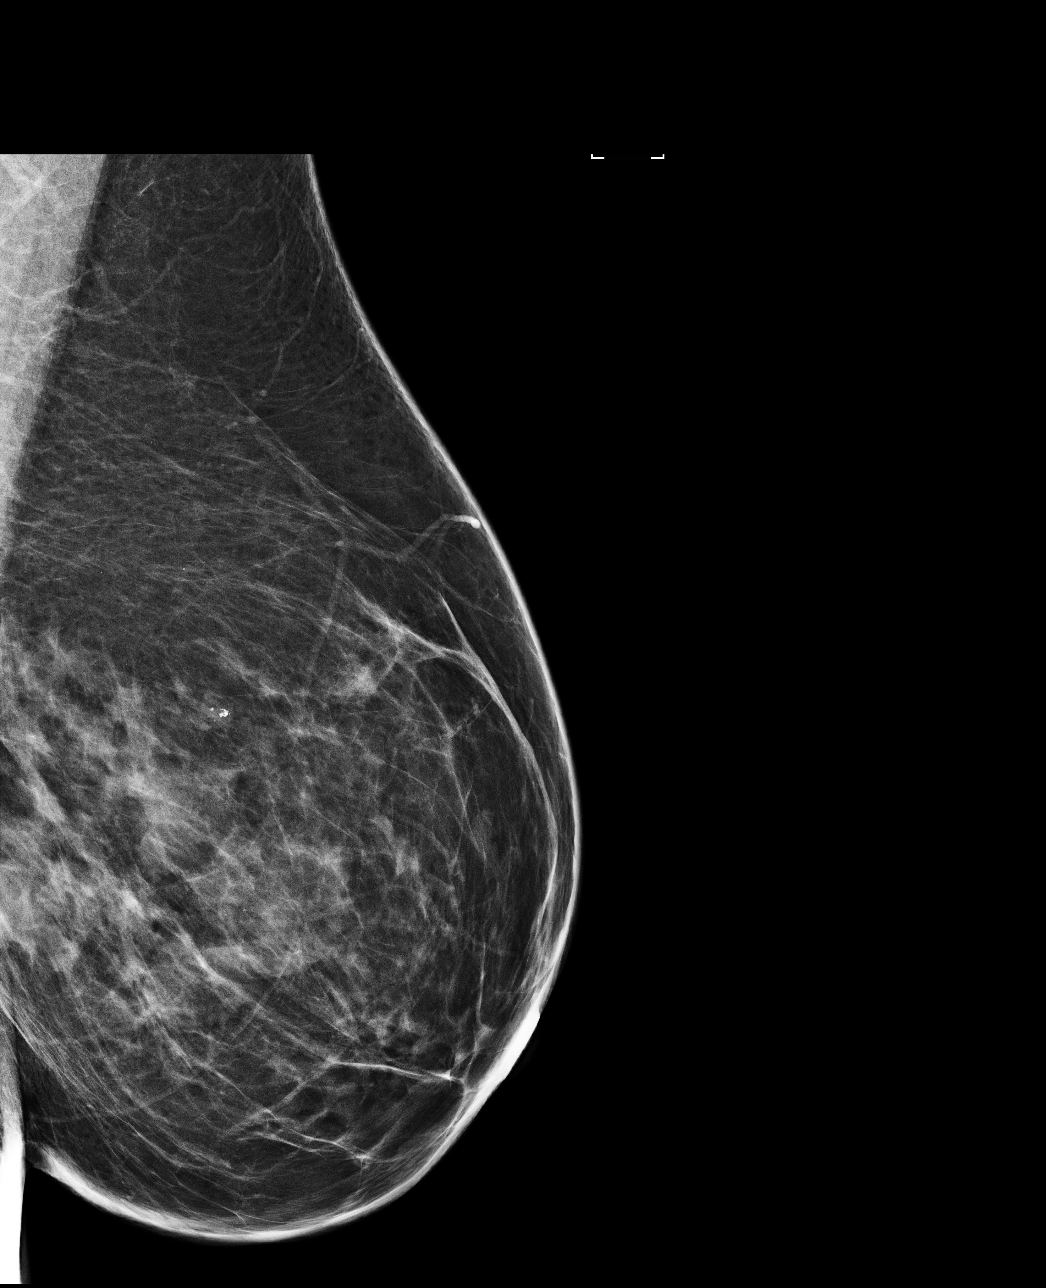

[3 of 3 positions shown; findings below may reference images not displayed]

ACR Breast Density Category c: The breast tissue is heterogeneously
dense, which may obscure small masses.
FINDINGS: Spot magnification CC and mediolateral views of the calcifications
and a full field mediolateral view were obtained.

Spot magnification images confirm a 4-5 mm group of dystrophic
appearing calcifications which may be associated with an isodense
mass; the possible mass is more conspicuous on the screening
tomosynthesis images. There are no suspicious linear or branching
forms.

In an attempt to identify a possible mass, targeted ultrasound is
performed, demonstrating an oval circumscribed parallel hypoechoic
mass containing coarse calcifications at the 2:30 o'clock position 8
cm from the nipple measuring approximately 0.8 x 0.4 x 0.7 cm,
demonstrating mixed posterior characteristics (including shadowing
due to the coarse calcifications), demonstrating internal power
Doppler flow, corresponding to the screening mammographic finding.

Incidentally identified at the 4 o'clock position 4 cm from the
nipple is an anechoic mass with a thin internal septation and
scattered internal echoes (versus clustered cysts) measuring
approximately 1.5 x 0.6 x 1.0 cm, demonstrating posterior acoustic
enhancement and no internal power Doppler flow.

Sonographic evaluation of the LEFT axilla demonstrates no pathologic
lymphadenopathy.
IMPRESSION: 1. Likely benign 0.8 cm degenerating fibroadenoma associated with
coarse dystrophic calcifications in the UPPER OUTER QUADRANT of the
LEFT breast at 2:30 o'clock 8 cm from the nipple which accounts for
the screening mammographic finding.
2. Likely benign complicated cyst or clustered cysts in the LOWER
OUTER QUADRANT of the LEFT breast at 4 o'clock position 4 cm from
the nipple.
3. No pathologic LEFT axillary lymphadenopathy.

RECOMMENDATION:
We discussed management options for the likely benign fibroadenoma
including excision, ultrasound-guided core biopsy, and short term
interval follow-up. Follow-up ultrasound is recommended at 6, 12 and
24 months to assess stability. The patient agrees with this plan.

I have discussed the findings and recommendations with the patient.
If applicable, a reminder letter will be sent to the patient
regarding the next appointment.

BI-RADS CATEGORY  3: Probably benign.

## 2023-05-25 ENCOUNTER — Ambulatory Visit: Payer: No Typology Code available for payment source | Admitting: Registered Nurse

## 2023-05-25 ENCOUNTER — Encounter: Payer: Self-pay | Admitting: Registered Nurse

## 2023-05-25 VITALS — BP 120/75 | HR 66 | Temp 98.7°F | Resp 16

## 2023-05-25 DIAGNOSIS — S96912A Strain of unspecified muscle and tendon at ankle and foot level, left foot, initial encounter: Secondary | ICD-10-CM

## 2023-05-25 NOTE — Patient Instructions (Signed)
Foot Sprain  A foot sprain is an injury to one of the ligaments in the feet. Ligaments are strong tissues that connect bones to each other. The ligament can be stretched too much. In some cases, it may tear. A tear can be either partial or complete. The severity of the sprain depends on how much of the ligament was damaged or torn. What are the causes? This condition is usually caused by suddenly twisting or pivoting your foot. What increases the risk? You are more likely to develop this condition if: You play a sport, such as basketball or football. You exercise or play a sport without first warming up your muscles. You start a new workout or sport. You suddenly increase how long or hard you exercise or play a sport. You have injured your foot or ankle before. What are the signs or symptoms? Symptoms of this condition start soon after an injury and include: Pain, especially in the arch of your foot. Bruising. Swelling. Being unable to walk or use your foot to support body weight. How is this diagnosed? This condition is diagnosed with a medical history and physical exam. You may also have imaging tests, such as: X-rays to check for broken bones (fractures). An MRI to see if the ligament is torn. How is this treated? Treatment for this condition depends on the severity of the sprain. Mild sprains and major sprains can be treated with: Rest, ice, pressure (compression), and elevation (RICE). Elevation means raising your injured foot. Keeping your foot in a fixed position (immobilization) for a period of time. This is done if your ligament is overstretched or partially torn. Your health care provider will apply a bandage, splint, or walking boot to keep your foot from moving until it heals. Using crutches or a scooter for a few weeks to avoid bearing weight on your foot while it is healing. Physical therapy exercises to improve movement and strength in your foot. Major sprains may also be  treated with: Surgery. This is done if your ligament is fully torn and a procedure is needed to reconnect it to the bone. A cast or splint. This will be needed after surgery. A cast or splint will need to stay on your foot while it heals. Follow these instructions at home: If you have a bandage, splint, or boot: Wear it as told by your health care provider. Remove it only as told by your health care provider. Loosen it if your toes tingle, become numb, or turn cold and blue. Keep it clean and dry. If you have a cast: Do not put pressure on any part of the cast until it is fully hardened. This may take several hours. Do not stick anything inside the cast to scratch your skin. Doing that increases your risk for infection. Check the skin around the cast every day. Tell your health care provider about any concerns. You may put lotion on dry skin around the edges of the cast. Do not put lotion on the skin underneath the cast. Keep it clean and dry. Bathing Do not take baths, swim, or use a hot tub until your health care provider approves. Ask your health care provider if you may take showers. You may only be allowed to take sponge baths. If the bandage, splint, boot, or cast is not waterproof: Do not let it get wet. Cover it with a watertight covering when you take a bath or shower. Managing pain, stiffness, and swelling  If directed, put ice on the  injured area. To do this: If you have a removable bandage, splint, or boot, remove it as told by your health care provider. Put ice in a plastic bag. Place a towel between your skin and the bag, or between your cast and the bag. Leave the ice on for 20 minutes, 2-3 times per day. Remove the ice if your skin turns bright red. This is very important. If you cannot feel pain, heat, or cold, you have a greater risk of damage to the area. Move your toes often to reduce stiffness and swelling. Elevate the injured area above the level of your heart while  you are sitting or lying down. Activity Do not use the injured foot to support your body weight until your health care provider says that you can. Use crutches or a scooter as told by your health care provider. Ask your health care provider what activities are safe for you. Do exercises as told by your health care provider. Gradually increase how much and how far you walk until your health care provider says it is safe to return to full activity. Driving Ask your health care provider if the medicine prescribed to you requires you to avoid driving or using machinery. Ask your health care provider when it is safe to drive if you have a bandage, splint, boot, or cast on your foot. General instructions Take over-the-counter and prescription medicines only as told by your health care provider. When you can walk without pain, wear supportive shoes that have stiff soles. Do not wear flip-flops. Do not walk barefoot. Keep all follow-up visits. This is important. Contact a health care provider if: Medicine does not help your pain. Your bruising or swelling gets worse or does not get better with treatment. Your splint, boot, or cast is damaged. Get help right away if: You develop severe numbness or tingling in your foot. Your foot turns blue, white, or gray, and it feels cold. Summary A foot sprain is an injury to one of the ligaments in the feet. Ligaments are strong tissues that connect bones to each other. You may need a bandage, splint, boot, or cast to support your foot while it heals. Sometimes, surgery may be needed. You may need physical therapy exercises to improve movement and strength in your foot. This information is not intended to replace advice given to you by your health care provider. Make sure you discuss any questions you have with your health care provider. Document Revised: 12/01/2019 Document Reviewed: 12/01/2019 Elsevier Patient Education  2024 [employer].

## 2023-05-25 NOTE — Progress Notes (Signed)
Subjective:    Patient ID: Danielle Cooke, female    DOB: Jan 13, 1976, 47 y.o.   MRN: [id number]  47y/o caucasian female single established patient here for evaluation left foot pain with fast walking pace.  Does not hurt when walking slowly.  Able to bear weight.  Started new yoga class last week and when tucking foot under body when sitting pain occurs also in left foot.  Denied known trauma/edema/rash/bruising  Started wearing her leg compression stockings again this week also.  Rash on thigh resolved with topical steroid application      Review of Systems  Constitutional:  Negative for chills, diaphoresis and fever.  HENT:  Negative for trouble swallowing and voice change.   Eyes:  Negative for photophobia and visual disturbance.  Respiratory:  Negative for shortness of breath.   Cardiovascular:  Positive for leg swelling.  Gastrointestinal:  Negative for diarrhea, nausea and vomiting.  Musculoskeletal:  Positive for gait problem and myalgias. Negative for back pain.  Skin:  Negative for color change, pallor, rash and wound.  Neurological:  Negative for tremors, syncope, weakness and light-headedness.  Psychiatric/Behavioral:  Negative for agitation, confusion and sleep disturbance.        Objective:   Physical Exam Vitals reviewed.  Constitutional:      General: She is awake. She is not in acute distress.    Appearance: Normal appearance. She is well-developed and well-groomed. She is obese. She is not ill-appearing, toxic-appearing or diaphoretic.  HENT:     Head: Normocephalic and atraumatic.     Jaw: There is normal jaw occlusion.     Salivary Glands: Right salivary gland is not diffusely enlarged. Left salivary gland is not diffusely enlarged.     Right Ear: Hearing and external ear normal.     Left Ear: Hearing and external ear normal.     Nose: Nose normal. No congestion or rhinorrhea.     Mouth/Throat:     Lips: Pink. No lesions.     Mouth: Mucous membranes are  moist.     Pharynx: Oropharynx is clear.  Eyes:     General: Lids are normal. Vision grossly intact. Gaze aligned appropriately. No allergic shiner or scleral icterus.       Right eye: No discharge.        Left eye: No discharge.     Extraocular Movements: Extraocular movements intact.     Conjunctiva/sclera: Conjunctivae normal.     Pupils: Pupils are equal, round, and reactive to light.  Neck:     Trachea: Trachea normal.  Cardiovascular:     Rate and Rhythm: Normal rate and regular rhythm.     Pulses:          Dorsalis pedis pulses are 2+ on the right side and 2+ on the left side.       Posterior tibial pulses are 2+ on the right side and 2+ on the left side.  Pulmonary:     Effort: Pulmonary effort is normal.     Breath sounds: Normal breath sounds and air entry. No stridor or transmitted upper airway sounds. No wheezing.     Comments: Spoke full sentences without difficulty; no cough observed in exam room Abdominal:     General: Abdomen is flat.  Musculoskeletal:        General: Tenderness present. No swelling, deformity or signs of injury. Normal range of motion.     Right hand: Normal strength. Normal capillary refill.     Left hand: Normal  strength. Normal capillary refill.     Cervical back: Normal range of motion and neck supple.     Right lower leg: No edema.     Left lower leg: No edema.     Right ankle: No swelling, deformity, ecchymosis or lacerations. No tenderness. Normal range of motion.     Left ankle: No swelling, deformity, ecchymosis or lacerations. No tenderness. Normal range of motion.     Right foot: Normal range of motion. No deformity, bunion, Charcot foot or foot drop.     Left foot: Normal range of motion. No deformity, bunion, Charcot foot or foot drop.       Feet:  Feet:     Right foot:     Skin integrity: No ulcer, blister, skin breakdown, erythema, warmth, callus, dry skin or fissure.     Toenail Condition: Right toenails are normal.     Left  foot:     Skin integrity: No ulcer, blister, skin breakdown, erythema, warmth, callus, dry skin or fissure.     Toenail Condition: Left toenails are normal.     Comments: Left lateral dorsum soft tissue trace localized nonpitting edema between 4th and 5th metatarsals mildly TTP no crepitus/bruising/erythema/temperature increased; normal gait in clinic wearing closed toed shoes Lymphadenopathy:     Head:     Right side of head: No submandibular or preauricular adenopathy.     Left side of head: No submandibular or preauricular adenopathy.     Cervical:     Right cervical: No superficial cervical adenopathy.    Left cervical: No superficial cervical adenopathy.  Skin:    General: Skin is warm and dry.     Capillary Refill: Capillary refill takes less than 2 seconds.     Coloration: Skin is not ashen, cyanotic, jaundiced, mottled, pale or sallow.     Findings: No abrasion, bruising, burn, erythema, signs of injury, laceration, lesion, petechiae, rash or wound.  Neurological:     General: No focal deficit present.     Mental Status: She is alert and oriented to person, place, and time. Mental status is at baseline.     Cranial Nerves: No cranial nerve deficit.     Sensory: No sensory deficit.     Motor: Motor function is intact. No weakness, tremor, abnormal muscle tone or seizure activity.     Coordination: Coordination is intact. Coordination normal.     Gait: Gait is intact. Gait normal.     Comments: In/out of chair without difficulty; gait sure and steady in clinic; bilateral hand grasp equal 5/5  Psychiatric:        Attention and Perception: Attention and perception normal.        Mood and Affect: Mood and affect normal.        Speech: Speech normal.        Behavior: Behavior normal. Behavior is cooperative.        Thought Content: Thought content normal.        Cognition and Memory: Cognition and memory normal.        Judgment: Judgment normal.           Assessment & Plan:    A-muscle strain left foot initial encounter  P-Discussed I can feel inflammation on lateral foot.  Discussed replace shoes after 500 miles daily 1 hour walking in same pair x 3 months or treads worn off.  Rotate 2 pairs of shoes for work to ensure drying out thoroughly between wearings and allow foam to  re-expand.  Shoes in good working order but midfoot support getting worn.  I do recommend compression socks for her also with shoe wear not barefoot.  [facility] has free foot assessment in [place] location can recommend sneakers to fit her foot type no purchase required.  Discussed most likely strain due to new exercise yoga class may take a couple weeks to fully heal.  May use epsom salt soak. May continue ice 15 minutes QID prn and tylenol 1000mg  po QID prn pain.  Exitcare handout on foot sprain.  Follow up re-evaluation if new symptoms occur.  Patient agreed with plan of care and had no further questions at this time.

## 2023-06-03 NOTE — Telephone Encounter (Signed)
Patient seen in clinic 05/25/23 and stated rash had resolved with treatment but required 2 courses.

## 2023-06-17 ENCOUNTER — Ambulatory Visit: Payer: No Typology Code available for payment source

## 2023-06-17 DIAGNOSIS — Z23 Encounter for immunization: Secondary | ICD-10-CM

## 2023-06-25 ENCOUNTER — Other Ambulatory Visit: Payer: Self-pay | Admitting: Registered Nurse

## 2023-06-25 DIAGNOSIS — E1165 Type 2 diabetes mellitus with hyperglycemia: Secondary | ICD-10-CM

## 2023-08-06 ENCOUNTER — Encounter: Payer: Self-pay | Admitting: Registered Nurse

## 2023-08-06 ENCOUNTER — Other Ambulatory Visit: Payer: Self-pay | Admitting: Registered Nurse

## 2023-08-06 DIAGNOSIS — J4520 Mild intermittent asthma, uncomplicated: Secondary | ICD-10-CM

## 2023-08-12 ENCOUNTER — Encounter: Payer: Self-pay | Admitting: Registered Nurse

## 2023-08-12 ENCOUNTER — Telehealth: Payer: Self-pay | Admitting: Registered Nurse

## 2023-08-12 DIAGNOSIS — J111 Influenza due to unidentified influenza virus with other respiratory manifestations: Secondary | ICD-10-CM

## 2023-08-12 MED ORDER — ALBUTEROL SULFATE HFA 108 (90 BASE) MCG/ACT IN AERS
1.0000 | INHALATION_SPRAY | RESPIRATORY_TRACT | 0 refills | Status: AC | PRN
Start: 2023-08-12 — End: ?

## 2023-08-12 MED ORDER — OSELTAMIVIR PHOSPHATE 75 MG PO CAPS
75.0000 mg | ORAL_CAPSULE | Freq: Two times a day (BID) | ORAL | 0 refills | Status: AC
Start: 2023-08-12 — End: 2023-08-17

## 2023-08-12 MED ORDER — SALINE SPRAY 0.65 % NA SOLN: 2.0000 | NASAL | 0 refills | Status: DC

## 2023-08-12 NOTE — Telephone Encounter (Signed)
Patient reported left work early Tuesday afternoon for rhinitis feeling under the weather.  Had not taken her allergy medications in a week and thought it was related to that but then developed a fever 102 oral F.  Today stayed home as fever again today, decreased appetite.  Tolerating fluids without difficulty denied vomiting/diarrhea.  Has headache/body aches and using her albuterol inhaler po prn.  Home covid test negative yesterday.  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  Discussed use albuterol inhaler 175mcg/act 2 puffs po q4-6h prn chest tightness, wheezing, protracted coughing already at home.  Honey 1 tablespoon po q4h prn cough. Tamiflu 75mg  po BID x 5 days #10 RF0 electronic Rx sent to her pharmacy of choice Start zyrtec or claritin 10mg  po daily after done with this liquid if still having rhinitis/post nasal drip as may have spring allergies here in [place].  Discussed flu typically body aches/fever  OTC nyquil/robitussin/delsym per manufacturer instructions   Discussed need to dry up post nasal drip and will help with cough/sore throat also.  May continue mucinex OTC BID if thick mucous.  Discussed many viruses circulating in community.  Discussed hydrate with water to keep urine pale yellow clear and voiding every 2-4 hours while awake.  Patient may use normal saline nasal spray 2 sprays each nostril q2h wa as needed. flonase 1 spray each nostril BID OTC.  Sudafed may cause drowsiness/elevated heart rate/insomnia/med head.  Avoid driving until she knows how he responds to medication.  OTC antihistamine of choice claritin/zyrtec 10mg  po daily.  May use OTC robitussin, delsym, nyquil per manufacturer instructions for cough if honey not helping enough.  Discussed post nasal drip triggering cough need to get it dried up.   Avoid triggers if possible.  Shower prior to bedtime if exposed to triggers. Re-evaluation Sunday as not scheduled to work again until Monday and must have fever  resolved x 24 hours prior to return onsite.  HR and supervisors Candise Bowens and [place] notified.  Patient A&Ox3 nasal congestion/sniffing audible during 5 minute telephone call.  Call or return to clinic as needed if these symptoms worsen or fail to improve as anticipated.   Exitcare handouts viral UR with cough, and sinus rinse sent to patient my chart.  Patient verbalized understanding of instructions, agreed with plan of care and had no further questions at this time.  P2:  Avoidance and hand washing.

## 2023-08-12 NOTE — Telephone Encounter (Signed)
Patient reported has used all her albuterol and needs refill for chest tightness cough electronic Rx sent to her pharmacy of choice for bronchitis related to influenza and triamcinolone cream running low needs refill for prn use leg rash flaring up with drier winter weather.  Electronic Rx sent to her pharmacy of choice.  Emollient at least daily after bathing also avoid scratching.  Patient verbalized understanding information/instructions, agreed with plan of care and had no further questions at this time.

## 2023-08-15 NOTE — Telephone Encounter (Signed)
Patient returned call stated fever broke this weekend and none today still having some yellow mucous and congestion improved headache resolved.  Has been hydrating more and feeling better.  Feels ready to return to work tomorrow.  Took tamiflu and picked up refills of triamcinolone cream and albuterol inhaler for prn use.  Throat clearing audible on 5 minute call.  No wheezing/cough/shortness of breath.  Spoke full sentences without difficulty.  Sent cleared to return onsite to supervisor and HR 08/16/23.  Patient to use normal call out procedures if diarrhea/vomiting or fever overnight in am.  Patient agreed with plan of care and had no further questions at this time.

## 2023-08-16 NOTE — Telephone Encounter (Signed)
Patient reported worked all day feeling better than yesterday.  Mucous clear today still finishing tamiflu asked if she needs to finish discussed yes as it helped and keeps her viral load decreased/feeling better.  Patient A&Ox3 spoke full sentences without difficulty no audible congestion/cough/throat clearing during 2 minute call.  Patient agreed with plan of care and verbalized understanding information/instructions.

## 2023-08-17 ENCOUNTER — Encounter: Payer: Self-pay | Admitting: Registered Nurse

## 2023-08-17 ENCOUNTER — Telehealth: Payer: Self-pay | Admitting: Registered Nurse

## 2023-08-17 DIAGNOSIS — R112 Nausea with vomiting, unspecified: Secondary | ICD-10-CM

## 2023-08-17 NOTE — Telephone Encounter (Signed)
Patient reported took one bite of hard boiled egg at work this am and worsening nausea and threw up x 1.  Threw egg away then tried apple but it worsened nausea also so stopped.  Ate celery and carrots on drive into work without difficulty.  Typically feels decreased appetite after wegovy injections on Friday but has never thrown up.  Had influenza in the past week discussed between the two may be related vomiting/nausea.  If fever/chills/diarrhea/sweats or other new symptoms to come to clinic today for evaluation.  Patient A&Ox3 spoke full sentences without difficulty agreed with plan of care and had no further questions at this time.  Spoke full sentences without difficulty no audible throat clearing/nasal congestion/cough/wheezing during 3 minute telephone call.

## 2023-09-22 NOTE — Telephone Encounter (Signed)
Patient seen in workcenter and lunch room following week denied concerns or questions symptoms improved/resolved

## 2023-12-03 ENCOUNTER — Encounter: Payer: Self-pay | Admitting: Registered Nurse

## 2023-12-03 ENCOUNTER — Ambulatory Visit: Admitting: Registered Nurse

## 2023-12-03 VITALS — BP 137/84 | HR 59 | Resp 16

## 2023-12-03 DIAGNOSIS — R002 Palpitations: Secondary | ICD-10-CM

## 2023-12-03 DIAGNOSIS — M545 Low back pain, unspecified: Secondary | ICD-10-CM

## 2023-12-03 DIAGNOSIS — K2960 Other gastritis without bleeding: Secondary | ICD-10-CM

## 2023-12-03 DIAGNOSIS — G5603 Carpal tunnel syndrome, bilateral upper limbs: Secondary | ICD-10-CM

## 2023-12-03 DIAGNOSIS — S46912A Strain of unspecified muscle, fascia and tendon at shoulder and upper arm level, left arm, initial encounter: Secondary | ICD-10-CM

## 2023-12-03 DIAGNOSIS — Z0000 Encounter for general adult medical examination without abnormal findings: Secondary | ICD-10-CM

## 2023-12-03 DIAGNOSIS — K5900 Constipation, unspecified: Secondary | ICD-10-CM

## 2023-12-03 MED ORDER — IBUPROFEN 800 MG PO TABS: 800.0000 mg | ORAL_TABLET | Freq: Every day | ORAL | Status: AC | PRN

## 2023-12-03 MED ORDER — ACETAMINOPHEN 500 MG PO TABS: 1000.0000 mg | ORAL_TABLET | Freq: Four times a day (QID) | ORAL | Status: AC | PRN

## 2023-12-03 MED ORDER — CALCIUM CARBONATE ANTACID 500 MG PO CHEW: 1.0000 | CHEWABLE_TABLET | Freq: Three times a day (TID) | ORAL | Status: AC | PRN

## 2023-12-03 MED ORDER — OMEPRAZOLE 20 MG PO CPDR: 20.0000 mg | DELAYED_RELEASE_CAPSULE | Freq: Every day | ORAL | Status: AC

## 2023-12-03 NOTE — Progress Notes (Signed)
 Subjective:    Patient ID: Danielle Cooke, female    DOB: 03/31/1976, 48 y.o.   MRN: [id number]  48y/o caucasian female established patient here for evaluation as not feeling well at work.  Started out this am with stabbing needles type pain epigastric area this am and stopped at work but now left scapular pain intermittent 8/10.  Denied shortness of breath/wheezing/chest tightness or pain radiating to jaw or left arm.  Has been taking advil at home due to moving/packing and unpacking boxes taking longer than expected to complete move.  Currently can't find her glucometer and stated has been eating out a lot as they didn't want to buy and have to move food or try to cook when housewares packed up.   "I am pretty sure I have gained back some of the weight I have lost this month" She has noticed palpitations continue was seen by [facility] and cardiology when they started in 2023 diagnosed with bigeminy on cardiac stress test but typically NSR per care everywhere review.  Had full cardiac work up.  Has not been taking omeprazole as doesn't know where bottle is either during move typically doesn't take daily.  Stated having some tingling in fingers again and has not been wearing wrist splints to sleep or at work wondering if carpal tunnel flared up again.  Has eaten breakfast today and had reese's peanut butter cup for mid morning snack.  Does not want to have her blood sugar tested as concerned it is high at this time.   Denied fever/chills/n/v/d.  Has been a little constipated typically has formed brown stool daily but hasn't had a stool every day this week.  Last stool yesterday.  Nonspecific abdomen discomfort this past week typically left and right lower but sometimes upper when pushing on her belly herself.  Drinking more coffee than usual and feeling fatigued due to moving household.   Patient had annual visit with [facility] 10/12/2023 BP 118/73 LDL 86 Hgba1c 5.6 weight 235lbs height 5'1-3/4" BMI 44  Previous year [facility] 101/75 BP LDL 115 Hgba1c 5.8 weight [id number] BMI 44.1     Review of Systems  Constitutional:  Positive for fatigue. Negative for chills, diaphoresis and fever.  HENT:  Negative for trouble swallowing and voice change.   Eyes:  Negative for photophobia and visual disturbance.  Respiratory:  Negative for cough, shortness of breath, wheezing and stridor.   Cardiovascular:  Positive for palpitations. Negative for chest pain and leg swelling.  Gastrointestinal:  Positive for constipation. Negative for diarrhea, nausea and vomiting.  Genitourinary:  Negative for difficulty urinating.  Musculoskeletal:  Positive for back pain and myalgias. Negative for gait problem, joint swelling, neck pain and neck stiffness.  Allergic/Immunologic: Positive for environmental allergies.  Neurological:  Positive for dizziness, numbness and headaches. Negative for tremors, seizures, syncope, facial asymmetry, speech difficulty, weakness and light-headedness.  Hematological:  Negative for adenopathy. Does not bruise/bleed easily.  Psychiatric/Behavioral:  Negative for agitation, confusion and sleep disturbance.        Objective:   Physical Exam Vitals and nursing note reviewed.  Constitutional:      General: She is awake. She is not in acute distress.    Appearance: Normal appearance. She is well-developed and well-groomed. She is morbidly obese. She is not ill-appearing, toxic-appearing or diaphoretic.  HENT:     Head: Normocephalic and atraumatic.     Jaw: There is normal jaw occlusion. No malocclusion.     Salivary Glands: Right salivary gland  is not diffusely enlarged or tender. Left salivary gland is not diffusely enlarged or tender.     Right Ear: Hearing and external ear normal. No decreased hearing noted. No drainage or swelling.     Left Ear: Hearing and external ear normal. No decreased hearing noted. No drainage or swelling.     Nose: Nose normal. No congestion or rhinorrhea.     Right Nostril:  No epistaxis.     Left Nostril: No epistaxis.     Right Sinus: No maxillary sinus tenderness or frontal sinus tenderness.     Left Sinus: No maxillary sinus tenderness or frontal sinus tenderness.     Mouth/Throat:     Lips: Pink. No lesions.     Mouth: Mucous membranes are moist. No oral lesions or angioedema.     Dentition: No gum lesions.     Tongue: No lesions. Tongue does not deviate from midline.     Palate: No mass and lesions.     Pharynx: Oropharynx is clear. Uvula midline. No oropharyngeal exudate, posterior oropharyngeal erythema or uvula swelling.     Tonsils: No tonsillar exudate.  Eyes:     General: Lids are normal. Vision grossly intact. Gaze aligned appropriately. Allergic shiner present. No scleral icterus.       Right eye: No discharge.        Left eye: No discharge.     Extraocular Movements: Extraocular movements intact.     Right eye: Normal extraocular motion and no nystagmus.     Left eye: Normal extraocular motion and no nystagmus.     Conjunctiva/sclera: Conjunctivae normal.     Pupils: Pupils are equal, round, and reactive to light.  Neck:     Trachea: Trachea and phonation normal.  Cardiovascular:     Rate and Rhythm: Normal rate and regular rhythm.     Pulses: Normal pulses.          Radial pulses are 2+ on the right side and 2+ on the left side.     Heart sounds: Normal heart sounds, S1 normal and S2 normal. No murmur heard.    No friction rub. No gallop.  Pulmonary:     Effort: Pulmonary effort is normal. No respiratory distress.     Breath sounds: Normal breath sounds and air entry. No stridor or transmitted upper airway sounds. No decreased breath sounds, wheezing, rhonchi or rales.     Comments: Spoke full sentences without difficulty; no cough observed in exam room Abdominal:     General: Abdomen is flat. Bowel sounds are decreased. There is no distension or abdominal bruit. There are no signs of injury.     Palpations: Abdomen is soft. There is no  shifting dullness, fluid wave or pulsatile mass.     Tenderness: There is generalized abdominal tenderness and tenderness in the right lower quadrant, epigastric area, left upper quadrant and left lower quadrant. There is no right CVA tenderness, left CVA tenderness, guarding or rebound. Negative signs include Murphy's sign.     Hernia: No hernia is present.     Comments: Dull to percussion x 4 quads; hypoactive bowel sounds x 4 quads; soft all quads and mildly TTP epigastric/LLQ/LUQ and RLQ; standing to sitting to supine and reversed on exam table at baseline  Musculoskeletal:        General: No swelling, tenderness or signs of injury. Normal range of motion.     Right shoulder: No swelling, deformity, effusion, laceration, tenderness or crepitus. Normal range of motion.  Normal strength.     Left shoulder: No swelling, deformity, effusion, laceration, tenderness or crepitus. Normal range of motion. Normal strength.     Right elbow: No swelling, deformity, effusion or lacerations. Normal range of motion.     Left elbow: No swelling, deformity, effusion or lacerations. Normal range of motion.     Right forearm: No swelling, edema, deformity, lacerations or tenderness.     Left forearm: No swelling, edema, deformity, lacerations or tenderness.     Right wrist: No swelling, deformity, effusion, lacerations or crepitus. Normal range of motion.     Left wrist: No swelling, deformity, effusion, lacerations or crepitus. Normal range of motion.     Right hand: No swelling, deformity, lacerations or tenderness. Normal range of motion. Normal strength. Normal sensation. Normal capillary refill.     Left hand: No swelling, deformity, lacerations or tenderness. Normal range of motion. Normal strength. Normal sensation. Normal capillary refill.       Arms:     Cervical back: Normal range of motion and neck supple. No swelling, edema, deformity, erythema, signs of trauma, lacerations, rigidity, spasms,  torticollis, tenderness, bony tenderness or crepitus. No pain with movement. Normal range of motion.     Thoracic back: No swelling, edema, deformity, signs of trauma, lacerations, spasms, tenderness or bony tenderness. Normal range of motion. No scoliosis.     Lumbar back: No swelling, edema, deformity, signs of trauma, lacerations, spasms, tenderness or bony tenderness. Normal range of motion.     Right lower leg: No edema.     Left lower leg: No edema.     Right ankle: No swelling, ecchymosis or lacerations. No tenderness. Normal range of motion.     Left ankle: No swelling, ecchymosis or lacerations. No tenderness. Normal range of motion.     Comments: Pain left scapular with AROM; left hand dominant; bilateral upper extremity joint equal AROM but pain thoracic/left scapular with left NEERS/atchley scratch/cross body reach and any left hand movements superior to shoulder joint/head; no palpated deformity neck/shoulder/upper arm or swelling/crepitus; negative empty can/lift off tests bilateral and internal/external shoulder rotation equal bilaterally  Lymphadenopathy:     Head:     Right side of head: No submental, submandibular, tonsillar, preauricular, posterior auricular or occipital adenopathy.     Left side of head: No submental, submandibular, tonsillar, preauricular, posterior auricular or occipital adenopathy.     Cervical: No cervical adenopathy.     Right cervical: No superficial, deep or posterior cervical adenopathy.    Left cervical: No superficial, deep or posterior cervical adenopathy.  Skin:    General: Skin is warm and dry.     Capillary Refill: Capillary refill takes less than 2 seconds.     Coloration: Skin is not ashen, cyanotic, jaundiced, mottled, pale or sallow.     Findings: No abrasion, abscess, acne, bruising, burn, ecchymosis, erythema, signs of injury, laceration, lesion, petechiae, rash or wound.     Nails: There is no clubbing.     Comments: Face/hands/neck and  ankles visually inspected  Neurological:     General: No focal deficit present.     Mental Status: She is alert and oriented to person, place, and time. Mental status is at baseline.     GCS: GCS eye subscore is 4. GCS verbal subscore is 5. GCS motor subscore is 6.     Cranial Nerves: Cranial nerves 2-12 are intact. No cranial nerve deficit, dysarthria or facial asymmetry.     Sensory: Sensation is intact.  Motor: Motor function is intact. No weakness, tremor, atrophy, abnormal muscle tone or seizure activity.     Coordination: Coordination is intact. Coordination normal.     Gait: Gait is intact. Gait normal.     Comments: In/out of chair  and on/off exam tablewithout difficulty; gait sure and steady in clinic; bilateral hand grasp equal 5/5  Psychiatric:        Attention and Perception: Attention and perception normal.        Mood and Affect: Mood and affect normal.        Speech: Speech normal.        Behavior: Behavior normal. Behavior is cooperative.        Thought Content: Thought content normal.        Cognition and Memory: Cognition and memory normal.        Judgment: Judgment normal.      Specimen: Blood - Venous blood specimen (specimen) Component Ref Range & Units 1 mo ago Comments  Hemoglobin A1c <5.7 % 5.6 Normal A1c:             Less than 5.7% Prediabetes range A1c:  5.7% to 6.4% Diabetes range A1c:     Greater than 6.4%  Estimated Average Glucose mg/dL 295 The ADA has supported the calculation of Average Glucose (eAG) based on HbA1c measurements. However, the eAG reflects the average glycemic level over 2-3 months and it would not necessarily match single glucose laboratory measurements, nor reflect changes in the daily glucose concentration.  Resulting Agency [facility] [place] [facility](CLIA# [id number])   Narrative Performed by [facility]  [facility](CLIA# [id number]) Presence of heterozygote variants in patients' samples do not  interfere with accurate measurements of HbA1c using Trinity affinity boronate chromatography, when no other clinical conditions affecting quality/quantity of HbA and/or quantity of hemoglobin, overall, as well as quality/quantity of RBCs are concurrently present.   Presence of homozygote variants and/or other medical conditions affecting the quality/quantity of HbA (e.g. alpha- and beta-thalassemia) and/or quantity of hemoglobin, overall, as well as quality/quantity of RBCs (e.g. anemia of any cause) trigger inaccurate evaluations of the glycated HbA1c with any analytical method, including Trinity affinity boronate chromatography.  In these circumstances, fructosamine testing for these patients is recommended.  Hb F present at concentrations of 11% or above can interfere with accurate measurements of HbA1c.  In these circumstances, fructosamine testing for these patients is recommended.   Specimen Collected: 10/12/23 09:05   Performed by: Harrie Limb  [facility](CLIA# [id number]) Last Resulted: 10/12/23 13:39  Received From: [facility]  Result Received: 12/03/23 10:58  Specimen: Blood - Venous structure (body structure) Component Ref Range & Units 1 mo ago Comments  Cholesterol, Total, Lipid Panel <200 mg/dL 387 NCEP III Guidelines  Total Cholesterol             Risk Classification                         <200 mg/dL                      Desirable   200-239 mg/dL                   Borderline High   >240 mg/dL                      High  Triglycerides, Lipid Panel <150 mg/dL 63 NCEP-ATP  III Guidelines   Triglyceride Value           Risk Classification                         <150 mg/dL                   Normal   150-199 mg/dL                Borderline High   200-499 mg/dL                High   >=960 mg/dL                  Very High  HDL Cholesterol - Lipid Panel >=60 mg/dL 39 Low  NCEP III Guidelines   HDLc                     Risk Classification                          >=60 mg/dL                  Optimal   >=40 mg/dL                  Desirable   <40 mg/dL                   Low    LDL Cholesterol, Calculated <100 mg/dL 86 LDL Cholesterol is calculated using the Martin/Hopkins equation. Source:  JAMA 2013; 310:  2061-68. NCEP-ATP III Guidelines   LDLc                     Risk Classification                         <100 mg/dL                   Optimal   100-129 mg/dL                Desirable   130-159 mg/dL                Borderline High   160-189 mg/dL                High   >454 mg/dL                   Very High  Non-HDL Cholesterol mg/dL 098   Resulting Agency [facility] [place] [facility](CLIA# [id number])   Specimen Collected: 10/12/23 09:05   Performed by: Harrie Limb [place] [facility](CLIA# [id number]) Last Resulted: 10/12/23 12:33  Received From: [facility]  Result Received: 12/03/23 10:58   Component Ref Range & Units 1 mo ago Comments  Sodium 136 - 145 mmol/L 140   Potassium 3.5 - 5.1 mmol/L 4.2 NO VISIBLE HEMOLYSIS  Chloride 98 - 107 mmol/L 105   CO2 21 - 31 mmol/L 29   Anion Gap 6 - 14 mmol/L 6   Glucose, Random 70 - 99 mg/dL 97   Blood Urea Nitrogen (BUN) 7 - 25 mg/dL 10   Creatinine 4.69 - 1.20 mg/dL 6.29   eGFR >52 WU/XLK/4.40N0 >90 GFR estimated by CKD-EPI equations(NKF 2021).  "Recommend confirmation of Cr-based eGFR by using Cys-based eGFR and other filtration markers (if applicable) in complex cases and  clinical decision-making, as needed."  Albumin 3.5 - 5.7 g/dL 4.2   Total Protein 6.4 - 8.9 g/dL 6.8   Bilirubin, Total 0.3 - 1.0 mg/dL 0.6   Alkaline Phosphatase (ALP) 34 - 104 U/L 39   Aspartate Aminotransferase (AST) 13 - 39 U/L 15   Alanine Aminotransferase (ALT) 7 - 52 U/L 14   Calcium 8.6 - 10.3 mg/dL 8.9   BUN/Creatinine Ratio  Creatinine is normal, ratio is not clinically indicated.  Resulting Agency [facility]  [facility](CLIA# [id number])   Specimen  Collected: 10/12/23 09:05   Performed by: Michel [facility](CLIA# [id number]) Last Resulted: 10/12/23 12:33  Received From: [facility]  Result Received: 12/03/23 10:58     Last filled omeprazole 30mg  12/19/2019 90 tabs doesn't take daily per patient reviewed epic and paper chart at [facility].  Patient given tums on arrival to clinic as she stated epigastric pain at that time.  [employer] does not have EKG onsite.  Discussed with patient if palpitations worsening today she will need to go to PCM/UC/ER for further evaluation or if chest pain that radiates to left arm/jaw, shortness of breath/dyspnea that does not resolved with rest, worst headache of her life or visual changes.  Patient drank 1/2 of 16oz water bottle with tums and reported feeling better dizziness resolved.  Refused POCT glucose testing.  Stated she would return to clinic if feeling worse after returning to her workcenter.  Given 1 free [place] govt home covid test to complete prior to returning to [employer].  Patient agreed with plan of care and had no further questions at that time.  Patient stated epigastric pain resolved while in clinic after tums UD.  Home covid test results negative.  Dizziness resolved.  Supervisor came to clinic to check on employee.  Patient notified her she would return to workcenter shortly feeling better now.    Assessment & Plan:  A-palpitations, gastritis due to NSAID use, constipation unspecified, preventative health care, left shoulder shoulder initial encounter, midline low back pain without sciatica and bilateral carpal tunnel subsequent visit  P-Patient has had full workup taking her medications as prescribed.  Increased caffeine intake this past week and decreased rest/restorative sleep.  Thinks she has sleep apnea but has not had sleep study yet.  Discussed decrease caffeine intake.  Follow up with PCM for sleep study referral.  Pulse RRR in clinic today.  Recent labs  normal electrolytes/kidney/liver function and Hgba1c stable.  Consider TSH level if no improvement with decreasing caffeine.  Exitcare handout on palpitations.  Patient stated that palpitations have come and gone over the past 2 years sometimes occurring more frequently has not had syncope but just can feel when her pulse skips a beat from time to time. Last EKG per chart review 2023 follow up with [facility] for repeat unless worsening this weekend/syncope then encouraged to go to ER.  Reviewed [facility]R on [facility] with [facility].  Try to get 7-8 hours sleep per night avoid sleep deprivation.  Eat regular meals with protein to keep blood sugar stable and not swing high/low as this can worsen palpitations and avoid dehydration drink water to keep urine pale yellow clear and voiding every 2-4 hours when awake.  Patient agreed with plan of care and had no further questions at this time.  Discussed advil use most likely irritated gastric lining taking for MSK pain with packing up and moving house hold items over the past month and still  not done. Try to keep boxes up on table or chair when packing and unpacking to lessen strain on lower back. Try tylenol 1000mg  po q6h prn pain and if no relief then ibuprofen 800mg  po q8h prn pain take with food and omeprazole DR 20mg  po daily.   Dispensed tums #3 from clinic stock and 90 tabs omeprazole 20mg  DR from [facility] to patient today  she is to take first dose with lunch today.  Discussed epsom salt bath, heat/ice packs/thermacare patch applied from clinic stock today 1 lumbar belt and 1 joint patch to left scapular/thoracic region.  Patient has biofreeze at home.  Discussed with employee ensure ergonomics at work desk optimal regarding monitor height, keyboard and mouse positioning and ensure she is using armrests to keep wrists in neutral position not hyperflexed or extended.  Patient stated she is currently not using chair armrests to support her wrists/forearms and will adjust her chair  to do so.  Use headset and do not cradle phone between ear/shoulder also as can lead to muscle strain/spasms with prolonged static positioning.  May use tennis ball/racquet ball for self massage.  Patient knows where her wrist splints are and to restart wear for sleep bilaterally and prn at work if having symptoms at work after [employer].  Red flags discussed with patient e.g. arm/leg weakness, saddle paresthesias or loss of bowel/bladder control to have same day re-evaluation with a provider.  Carpal tunnel exitcare handout and lumbar strain.  Patient agreed with plan of care and had no further questions at this time.  Completed labs/BP/height/weight at [facility] Feb 2025.  Patient signed Be Well paperwork and tobacco attestation met requirements for insurance discount starting 24 May 2024.  HR Tonya notified given [employer] form.  Discussed CBC/TSH and iron level not checked with PCM and if she desires can have drawn with RN Thersia Flax here at [facility].  Ordered today nonfasting.   Next labs due Feb 2026 fasting.  Nonsmoker/vaper.  Patient verbalized understanding information/instructions, agreed with plan of care and hand no further questions at this time.

## 2023-12-03 NOTE — Patient Instructions (Addendum)
 Pinched Nerve in the Wrist (Carpal Tunnel Syndrome): What to Know  Pinched nerve in the wrist (carpal tunnel syndrome, or CTS) is a nerve problem that causes pain, numbness, and weakness in the wrist, hand, and fingers. The carpal tunnel is a narrow space that is on the palm side of your wrist. Repeated wrist motions or certain diseases may cause swelling in the tunnel. This swelling can pinch the main nerve in the wrist (the median nerve). What are the causes? CTS may be caused by: Moving your hand and wrist over and over again while doing a task. Hurting the wrist. Arthritis. A pocket of fluid (cyst) or a growth (tumor) in the carpal tunnel. Fluid buildup when you are pregnant. Use of tools that vibrate. In some cases, the cause of CTS is not known. What increases the risk? You're more likely to have CTS if: You have a job that makes you do these things: Move your hand firmly over and over again. Work with tools that vibrate, such as drills or sanders. You're female. You have diabetes, obesity, thyroid problems, or kidney failure. What are the signs or symptoms? Symptoms of this condition include: A tingling feeling in your fingers. You may feel this pain in the thumb, index finger, or middle finger. Tingling or loss of feeling in your hand. Pain in your entire arm. This pain may get worse when you bend your wrist and elbow for a long time. Pain in your wrist that goes up your arm to your shoulder. Pain that goes down into your palm or fingers. Weakness in your hands. You may find it hard to grab and hold items. Your symptoms may feel worse during the night. How is this diagnosed? CTS is diagnosed with a medical history and physical exam. Tests and imaging may also be done to: Check the electrical signals sent by your nerves into the muscles. Check how well electrical signals pass through your nerves. Check possible causes of your CTS. These include X-rays, ultrasound, and  MRI. How is this treated? CTS may be treated with: Lifestyle changes. You will be asked to stop or change the activity that caused your problem. Physical therapy. This may include: Exercises that stretch and strengthen the muscles and tendons in the wrist and hand. Nerve gliding or flossing exercises. These help keep nerves moving smoothly through the tissues around them. Occupational therapy. You'll learn how to use your hand again. Medicines for pain and swelling. You may have injections in your wrist. A wrist splint or brace. Surgery. Follow these instructions at home: If you have a splint or brace: Wear the splint or brace as told. Take it off only if your provider says you can. Check the skin around it every day. Tell your provider if you see problems. Loosen the splint or brace if your fingers tingle, are numb, or turn cold and blue. Keep the splint or brace clean and dry. If the splint or brace isn't waterproof: Do not let it get wet. Cover it when you take a bath or shower. Use a cover that doesn't let any water in. Managing pain, stiffness, and swelling  Use ice or an ice pack as told. If you have a splint or brace that you can take off, remove it only as told. Place a towel between your skin and the ice. Leave the ice on for 20 minutes, 2-3 times a day. If your skin turns red, take off the ice right away to prevent skin damage. The risk  of damage is higher if you can't feel pain, heat, or cold. Move your fingers often to reduce stiffness and swelling. General instructions Take your medicines only as told. Rest your wrist and hand from activity that may cause pain. If your CTS is caused by things you do at work, talk with your employer about making changes. For example, you may need a wrist pad to use while typing. Exercise as told. Follow instructions on how to do nerve gliding or flossing exercises. These help keep nerves in moving smoothly through the tissues around  them. Keep all follow-up visits. This is important. Where to find more information [employer]: orthoinfo.aaos.[employer]roke: BasicFM.no Contact a health care provider if: You have new symptoms. Your pain is not controlled with medicines. Your symptoms get worse. Get help right away if: Your hand or wrist tingles or is numb, and the symptoms become very bad. This information is not intended to replace advice given to you by your health care provider. Make sure you discuss any questions you have with your health care provider. Document Revised: 06/22/2023 Document Reviewed: 04/09/2023 Elsevier Patient Education  2024 [employer]Shoulder Sprain  A shoulder sprain is a partial or complete tear in one of the tough, fiber-like tissues (ligaments) in the shoulder. The ligaments in the shoulder help to hold the shoulder in place. What are the causes? This condition may be caused by: A fall. A hit to the shoulder. A twist of the arm. What increases the risk? You are more likely to develop this condition if you: Play sports. Have problems with balance or coordination. What are the signs or symptoms? Symptoms of this condition include: Pain when moving the shoulder. Limited ability to move the shoulder. Swelling and tenderness on top of the shoulder. Warmth in the shoulder. A change in the shape of the shoulder. Redness or bruising on the shoulder. How is this diagnosed? This condition is diagnosed with: A physical exam. During the exam, you may be asked to do simple exercises with your shoulder. Imaging tests such as X-rays, MRI, or a CT scan. These tests can show how severe the sprain is. How is this treated? This condition may be treated with: Rest. Pain medicine. Ice. A sling or brace. This is used to keep the arm still while the shoulder is healing. Physical therapy or rehabilitation exercises. These  help to improve the range of motion and strength of the shoulder. Surgery (rare). Surgery may be needed if the sprain caused a joint to become unstable. Surgery may also be needed to reduce pain. Some people may develop ongoing shoulder pain or lose some range of motion in the shoulder. However, most people do not develop long-term problems. Follow these instructions at home:  If you have a removable sling or brace: Wear the sling or brace as told by your health care provider. Remove it only as told by your provider. Check the skin around the sling or brace every day. Tell your provider about any concerns. Loosen the sling or brace if your fingers tingle, become numb, or turn cold and blue. Keep the sling or brace clean. If the sling or brace is not waterproof: Do not let it get wet. Remove the sling or brace before taking a bath or shower, as told by your provider. Activity Rest your shoulder. Move your arm only as much as told by your provider, but move your hand and fingers often to prevent stiffness  and swelling. Return to your normal activities as told by your provider. Ask your provider what activities are safe for you. Ask your provider when it is safe for you to drive if you have a sling or brace on your shoulder. If you were shown how to do any exercises, do them as told by your provider. General instructions If told, put ice on the affected area. If you have a removable sling or brace, remove it as told by your provider. Put ice in a plastic bag. Place a towel between your skin and the bag. Leave the ice on for 20 minutes, 2-3 times a day. If your skin turns bright red, remove the ice right away to prevent skin damage. The risk of damage is higher if you cannot feel pain, heat, or cold. Take over-the-counter and prescription medicines only as told by your provider. Do not use any products that contain nicotine or tobacco. These products include cigarettes, chewing tobacco, and  vaping devices, such as e-cigarettes. These can delay healing. If you need help quitting, ask your provider. Keep all follow-up visits. Your provider will monitor your injury and activity level. Contact a health care provider if: Your pain gets worse. Your pain is not relieved with medicines. You have increased redness or swelling. You have a fever. Get help right away if: You cannot move your arm or shoulder. You develop severe numbness or tingling in your arm, hand, or fingers. Your arm, hand, or fingers feel cold and turn blue, white, or gray. This information is not intended to replace advice given to you by your health care provider. Make sure you discuss any questions you have with your health care provider. Document Revised: 03/25/2022 Document Reviewed: 03/25/2022 Elsevier Patient Education  2024 [employer].Constipation, Adult Constipation is when a person has fewer than three bowel movements in a week, has difficulty having a bowel movement, or has stools (feces) that are dry, hard, or larger than normal. Constipation may be caused by an underlying condition. It may become worse with age if a person takes certain medicines and does not take in enough fluids. Follow these instructions at home: Eating and drinking  Eat foods that have a lot of fiber, such as beans, whole grains, and fresh fruits and vegetables. Limit foods that are low in fiber and high in fat and processed sugars, such as fried or sweet foods. These include french fries, hamburgers, cookies, candies, and soda. Drink enough fluid to keep your urine pale yellow. General instructions Exercise regularly or as told by your health care provider. Try to do 150 minutes of moderate exercise each week. Use the bathroom when you have the urge to go. Do not hold it in. Take over-the-counter and prescription medicines only as told by your health care provider. This includes any fiber supplements. During bowel  movements: Practice deep breathing while relaxing the lower abdomen. Practice pelvic floor relaxation. Watch your condition for any changes. Let your health care provider know about them. Keep all follow-up visits as told by your health care provider. This is important. Contact a health care provider if: You have pain that gets worse. You have a fever. You do not have a bowel movement after 4 days. You vomit. You are not hungry or you lose weight. You are bleeding from the opening between the buttocks (anus). You have thin, pencil-like stools. Get help right away if: You have a fever and your symptoms suddenly get worse. You leak stool or have blood in  your stool. Your abdomen is bloated. You have severe pain in your abdomen. You feel dizzy or you faint. Summary Constipation is when a person has fewer than three bowel movements in a week, has difficulty having a bowel movement, or has stools (feces) that are dry, hard, or larger than normal. Eat foods that have a lot of fiber, such as beans, whole grains, and fresh fruits and vegetables. Drink enough fluid to keep your urine pale yellow. Take over-the-counter and prescription medicines only as told by your health care provider. This includes any fiber supplements. This information is not intended to replace advice given to you by your health care provider. Make sure you discuss any questions you have with your health care provider. Document Revised: 06/24/2022 Document Reviewed: 06/24/2022 Elsevier Patient Education  2024 [employer].Gastritis, Adult Gastritis is inflammation of the stomach. There are two kinds of gastritis: Acute gastritis. This kind develops suddenly. Chronic gastritis. This kind is much more common. It develops slowly and lasts for a long time. Gastritis happens when the lining of the stomach becomes weak or gets damaged. Without treatment, gastritis can lead to stomach bleeding and ulcers. What are the  causes? This condition may be caused by: An infection. Drinking too much alcohol. Certain medicines. These include steroids, antibiotics, and some over-the-counter medicines, such as aspirin or ibuprofen. Having too much acid in the stomach. Having a disease of the stomach. Other causes may include: An allergic reaction. Some cancer treatments (radiation). Smoking cigarettes or the use of products that contain nicotine or tobacco. In some cases, the cause of this condition is not known. What increases the risk? Having a disease of the intestines. Having a disease in which the body's immune system attacks the body (autoimmune disease), such as Crohn's disease. Using aspirin or ibuprofen and other NSAIDs to treat other conditions, such as heart disease or chronic pain. Stress. What are the signs or symptoms? Symptoms of this condition include: Pain or a burning sensation in the upper abdomen. Nausea. Vomiting. An uncomfortable feeling of fullness after eating. Weight loss. Bad breath. Blood in your vomit or stool (feces). In some cases, there are no symptoms. How is this diagnosed? This condition may be diagnosed based on your medical history, a physical exam, and tests. Tests may include: Your medical history and a description of your symptoms. A physical exam. Tests. These can include: Blood tests. Stool tests. A test in which a thin, flexible instrument with a light and a camera is passed down the esophagus and into the stomach (upper endoscopy). A test in which a tissue sample is removed to look at it under a microscope (biopsy). How is this treated? This condition may be treated with medicines. The medicines that are used vary depending on the cause of the gastritis. If the condition is caused by a bacterial infection, you may be given antibiotic medicines. If the condition is caused by too much acid in the stomach, you may be given medicines called H2 blockers, proton pump  inhibitors, or antacids. Treatment may also involve stopping the use of certain medicines such as aspirin or ibuprofen and other NSAIDs. Follow these instructions at home: Medicines Take over-the-counter and prescription medicines only as told by your health care provider. If you were prescribed an antibiotic medicine, take it as told by your health care provider. Do not stop taking the antibiotic even if you start to feel better. Alcohol use Do not drink alcohol if: Your health care provider tells you not  to drink. You are pregnant, may be pregnant, or are planning to become pregnant. If you drink alcohol: Limit your use to: 0-1 drink a day for women. 0-2 drinks a day for men. Know how much alcohol is in your drink. In the [place] one drink equals one 12 oz bottle of beer (355 mL), one 5 oz glass of wine (148 mL), or one 1 oz glass of hard liquor (44 mL). General instructions  Eat small, frequent meals instead of large meals. Avoid foods and drinks that make your symptoms worse. Talk with your health care provider about ways to manage stress, such as getting regular exercise or practicing deep breathing, meditation, or yoga. Do not use any products that contain nicotine or tobacco. These products include cigarettes, chewing tobacco, and vaping devices, such as e-cigarettes. If you need help quitting, ask your health care provider. Drink enough fluid to keep your urine pale yellow. Keep all follow-up visits. This is important. Contact a health care provider if: Your symptoms get worse. Your abdominal pain gets worse. Your symptoms return after treatment. You have a fever. Get help right away if: You vomit blood or a substance that looks like coffee grounds. You have black or dark red stools. You are unable to keep fluids down. These symptoms may represent a serious problem that is an emergency. Do not wait to see if the symptoms will go away. Get medical help right away. Call your  local emergency services (911 in the [place] Do not drive yourself to the hospital. Summary Gastritis is inflammation of the lining of the stomach that can occur suddenly (acute) or develop slowly over time (chronic). This condition is diagnosed with a medical history, a physical exam, or tests. This condition may be treated with medicines to treat infection or medicines to reduce the amount of acid in your stomach. Follow your health care provider's instructions about taking medicines, making changes to your diet, and knowing when to call for help. This information is not intended to replace advice given to you by your health care provider. Make sure you discuss any questions you have with your health care provider. Document Revised: 12/14/2020 Document Reviewed: 12/14/2020 Elsevier Patient Education  2024 [employer] Inc.Palpitations Palpitations are feelings that your heartbeat is irregular or is faster than normal. It may feel like your heart is fluttering or skipping a beat. Palpitations may be caused by many things, including smoking, caffeine, alcohol, stress, and certain medicines or drugs. Most causes of palpitations are not serious.  However, some palpitations can be a sign of a serious problem. Further tests and a thorough medical history will be done to find the cause of your palpitations. Your provider may order tests such as an ECG, labs, an echocardiogram, or an ambulatory continuous ECG monitor. Follow these instructions at home: Pay attention to any changes in your symptoms. Let your health care provider know about them. Take these actions to help manage your symptoms: Eating and drinking Follow instructions from your health care provider about eating or drinking restrictions. You may need to avoid foods and drinks that may cause palpitations. These may include: Caffeinated coffee, tea, soft drinks, and energy drinks. Chocolate. Alcohol. Diet pills. Lifestyle     Take steps to  reduce your stress and anxiety. Things that can help you relax include: Yoga. Mind-body activities, such as deep breathing, meditation, or using words and images to create positive thoughts (guided imagery). Physical activity, such as swimming, jogging, or walking. Tell your health care  provider if your palpitations increase with activity. If you have chest pain or shortness of breath with activity, do not continue the activity until you are seen by your health care provider. Biofeedback. This is a method that helps you learn to use your mind to control things in your body, such as your heartbeat. Get plenty of rest and sleep. Keep a regular bed time. Do not use drugs, including cocaine or ecstasy. Do not use marijuana. Do not use any products that contain nicotine or tobacco. These products include cigarettes, chewing tobacco, and vaping devices, such as e-cigarettes. If you need help quitting, ask your health care provider. General instructions Take over-the-counter and prescription medicines only as told by your health care provider. Keep all follow-up visits. This is important. These may include visits for further testing if palpitations do not go away or get worse. Contact a health care provider if: You continue to have a fast or irregular heartbeat for a long period of time. You notice that your palpitations occur more often. Get help right away if: You have chest pain or shortness of breath. You have a severe headache. You feel dizzy or you faint. These symptoms may represent a serious problem that is an emergency. Do not wait to see if the symptoms will go away. Get medical help right away. Call your local emergency services (911 in the [place] Do not drive yourself to the hospital. Summary Palpitations are feelings that your heartbeat is irregular or is faster than normal. It may feel like your heart is fluttering or skipping a beat. Palpitations may be caused by many things, including  smoking, caffeine, alcohol, stress, certain medicines, and drugs. Further tests and a thorough medical history may be done to find the cause of your palpitations. Get help right away if you faint or have chest pain, shortness of breath, severe headache, or dizziness. This information is not intended to replace advice given to you by your health care provider. Make sure you discuss any questions you have with your health care provider. Document Revised: 01/01/2021 Document Reviewed: 01/01/2021 Elsevier Patient Education  2024 [employer]  Lumbar Strain A lumbar strain, which is sometimes called a low-back strain, is a stretch or tear in a muscle or tendons in the lower back (lumbar spine). Tendons are the strong cords of tissue that attach muscle to bone. This type of injury occurs when muscles or tendons are torn or are stretched beyond their limits. Lumbar strains can range from mild to severe. Mild strains may involve stretching a muscle or tendon without tearing it. These may heal in 1-2 weeks. More severe strains involve tearing of muscle fibers or tendons. These will cause more pain and may take 6-8 weeks to heal. What are the causes? This condition may be caused by: Trauma, such as a fall or a hit to the body. Twisting or overstretching the back. This may result from doing activities that take a lot of energy, such as lifting heavy objects. What increases the risk? A lumbar strain is more common in: Athletes. People with obesity. People who do repeated lifting, bending, or other movements that involve their back. What are the signs or symptoms? Symptoms of this condition may include: Sharp or dull pain in the lower back that does not go away. The pain may spread to the buttocks. Stiffness or limited range of motion. Sudden muscle tightening (spasms). How is this diagnosed? This condition may be diagnosed based on: Your symptoms. Your medical history. A  physical exam. Imaging  tests, such as: X-rays. MRI. How is this treated? Treatment for this condition may include: Rest. Applying heat and cold to the affected area. Over-the-counter medicines to help with pain and inflammation, such as NSAIDs. Prescription medicine for pain or to relax the muscles. These may be needed for a short time. Physical therapy exercises to improve movement and strength. Follow these instructions at home: Managing pain, stiffness, and swelling     If told, put ice on the injured area during the first 24 hours after your injury. Put ice in a plastic bag. Place a towel between your skin and the bag. Leave the ice on for 20 minutes, 2-3 times a day. If told, apply heat to the affected area as often as told by your health care provider. Use the heat source that your health care provider recommends, such as a moist heat pack or a heating pad. Place a towel between your skin and the heat source. Leave the heat on for 20-30 minutes. Remove the heat if your skin turns bright red. This is especially important if you are unable to feel pain, heat, or cold. You have a greater risk of getting burned. If your skin turns bright red, remove the ice or heat right away to prevent skin damage. The risk of damage is higher if you cannot feel pain, heat, or cold. Activity Rest and return to your normal activities as told by your health care provider. Ask your health care provider what activities are safe for you. Do exercises as told by your health care provider. General instructions Take over-the-counter and prescription medicines only as told by your health care provider. Ask your health care provider if the medicine prescribed to you: Requires you to avoid driving or using machinery. Can cause constipation. You may need to take these actions to prevent or treat constipation: Drink enough fluid to keep your urine pale yellow. Take over-the-counter or prescription medicines. Eat foods that are high  in fiber, such as beans, whole grains, and fresh fruits and vegetables. Limit foods that are high in fat and processed sugars, such as fried or sweet foods. Do not use any products that contain nicotine or tobacco. These products include cigarettes, chewing tobacco, and vaping devices, such as e-cigarettes. If you need help quitting, ask your health care provider. How is this prevented? To prevent a future low-back injury: Always warm up properly before physical activity or sports. Cool down and stretch after being active. Use correct form when playing sports and lifting heavy objects. Bend your knees before you lift heavy objects. Use good posture when sitting and standing. Stay physically fit and keep a healthy weight. Do at least 150 minutes of moderate intensity exercise each week, such as brisk walking or water aerobics. Do strength exercises at least 2 times each week. Contact a health care provider if: Your back pain does not improve after several weeks of treatment. Your symptoms get worse. You have a fever. Get help right away if: Your back pain is severe. You are unable to stand or walk. You develop pain in your legs. You have weakness in your buttocks or legs. You have trouble controlling when you urinate or when you have a bowel movement. You have frequent, painful, or bloody urination. This information is not intended to replace advice given to you by your health care provider. Make sure you discuss any questions you have with your health care provider. Document Revised: 12/14/2022 Document Reviewed: 03/03/2022 Elsevier Patient  Education  2024 [employer].

## 2023-12-17 ENCOUNTER — Other Ambulatory Visit: Payer: Self-pay | Admitting: Internal Medicine

## 2023-12-17 DIAGNOSIS — Z1231 Encounter for screening mammogram for malignant neoplasm of breast: Secondary | ICD-10-CM

## 2023-12-20 ENCOUNTER — Encounter: Payer: Self-pay | Admitting: Internal Medicine

## 2023-12-20 DIAGNOSIS — R921 Mammographic calcification found on diagnostic imaging of breast: Secondary | ICD-10-CM

## 2024-01-24 ENCOUNTER — Ambulatory Visit

## 2024-01-24 VITALS — BP 121/80 | HR 62 | Temp 97.6°F

## 2024-01-24 DIAGNOSIS — J329 Chronic sinusitis, unspecified: Secondary | ICD-10-CM

## 2024-01-24 NOTE — Progress Notes (Signed)
 Possible sinus infection with green expectorate and covid test done . Results are neg.

## 2024-01-25 ENCOUNTER — Encounter: Payer: Self-pay | Admitting: Registered Nurse

## 2024-01-25 ENCOUNTER — Ambulatory Visit: Admitting: Registered Nurse

## 2024-01-25 VITALS — BP 104/72 | HR 82 | Temp 96.3°F

## 2024-01-25 DIAGNOSIS — J019 Acute sinusitis, unspecified: Secondary | ICD-10-CM

## 2024-01-25 NOTE — Progress Notes (Signed)
Possible sinus infection

## 2024-01-25 NOTE — Patient Instructions (Signed)
 How to Perform a Sinus Rinse A sinus rinse is a home treatment that is used to rinse your sinuses with a germ-free (sterile) mixture of salt and water (saline solution). Sinuses are air-filled spaces in your skull that are behind the bones of your face and forehead. They open into your nasal cavity. A sinus rinse can help to clear mucus, dirt, dust, or pollen from your nasal cavity. You may do a sinus rinse when you have a cold, a virus, nasal allergy symptoms, a sinus infection, or stuffiness in your nose or sinuses. What are the risks? A sinus rinse is generally safe and effective. However, there are a few risks, which include: A burning sensation in your sinuses. This may happen if you do not make the saline solution as directed. Be sure to follow all directions when making the saline solution. Nasal irritation. Infection. This may be from unclean supplies or from contaminated water. Infection from contaminated water is rare, but possible. Do not do a sinus rinse if you have had ear or nasal surgery, ear infection, or plugged ears, unless recommended by your health care provider. Supplies needed: Saline solution or powder. Distilled or sterile water to mix with saline powder. You may use boiled and cooled tap water. Boil tap water for 5 minutes; cool until it is lukewarm. Use within 24 hours. Do not use regular tap water to mix with the saline solution. Neti pot or nasal rinse bottle. These supplies release the saline solution into your nose and through your sinuses. Neti pots and nasal rinse bottles can be purchased at [employer], a health food store, or online. How to perform a sinus rinse  Wash your hands with soap and water for at least 20 seconds. If soap and water are not available, use hand sanitizer. Wash your device according to the directions that came with the product and then dry it. Use the solution that comes with your product or one that is sold separately in stores.  Follow the mixing directions on the package to mix with sterile or distilled water. Fill the device with the amount of saline solution noted in the device instructions. Stand by a sink and tilt your head sideways over the sink. Place the spout of the device in your upper nostril (the one closer to the ceiling). Gently pour or squeeze the saline solution into your nasal cavity. The liquid should drain out from the lower nostril if you are not too congested. While rinsing, breathe through your open mouth. Gently blow your nose to clear any mucus and rinse solution. Blowing too hard may cause ear pain. Turn your head in the other direction and repeat in your other nostril. Clean and rinse your device with clean water and then air-dry it. Talk with your health care provider or pharmacist if you have questions about how to do a sinus rinse. Summary A sinus rinse is a home treatment that is used to rinse your sinuses with a sterile mixture of salt and water (saline solution). You may do a sinus rinse when you have a cold, a virus, nasal allergy symptoms, a sinus infection, or stuffiness in your nose or sinuses. A sinus rinse is generally safe and effective. Follow all instructions carefully. This information is not intended to replace advice given to you by your health care provider. Make sure you discuss any questions you have with your health care provider. Document Revised: 01/27/2021 Document Reviewed: 01/27/2021 Elsevier Patient Education  2024 [employer]Sinus Pain  Sinus pain may occur when your sinuses become clogged or swollen. Sinuses are air-filled spaces in your skull that are behind the bones of your face and forehead. Sinus pain can range from mild to severe. What are the causes? Sinus pain can result from various conditions that affect the sinuses. Common causes include: Colds. Sinus infections. Allergies. What are the signs or symptoms? The main symptom of this condition is pain  or pressure in your face, forehead, ears, or upper teeth. People who have sinus pain often have other symptoms, such as: Congested or runny nose. Fever. Inability to smell. Headache. Weather changes can make symptoms worse. How is this diagnosed? Your health care provider will diagnose this condition based on your symptoms and a physical exam. If you have pain that keeps coming back or does not go away, your health care provider may recommend more testing. This may include: Imaging tests, such as a CT scan or MRI, to check for problems with your sinuses. Examination of your sinuses using a thin tool with a camera that is inserted through your nose (endoscopy). How is this treated? Treatment for this condition depends on the cause. Sinus pain that is caused by a sinus infection may be treated with antibiotic medicine. Sinus pain that is caused by congestion may be helped by rinsing out (flushing) the nose and sinuses with saline solution. Sinus pain that is caused by allergies may be helped by allergy medicines (antihistamines) and medicated nasal sprays. Sinus surgery may be needed in some cases if other treatments do not help. Follow these instructions at home: General instructions If directed: Apply a warm, moist washcloth to your face to help relieve pain. Use a nasal saline wash. Follow the directions on the bottle or box. Hydrate and humidify Drink enough water to keep your urine clear or pale yellow. Staying hydrated will help to thin your mucus. Use a humidifier if your home is dry. Inhale steam for 10-15 minutes, 3-4 times a day or as told by your health care provider. You can do this in the bathroom while a hot shower is running. Limit your exposure to cool or dry air. Medicines  Take over-the-counter and prescription medicines only as told by your health care provider. If you were prescribed an antibiotic medicine, take it as told by your health care provider. Do not stop  taking the antibiotic even if you start to feel better. If you have congestion, use a nasal spray to help lessen pressure. Contact a health care provider if: You have sinus pain more than one time a week. You have sensitivity to light or sound. You develop a fever. You feel nauseous or you vomit. Your sinus pain or headache does not get better with treatment. Get help right away if: You have vision problems. You have sudden, severe pain in your face or head. You have a seizure. You are confused. You have a stiff neck. Summary Sinus pain occurs when your sinuses become clogged or swollen. Sinus pain can result from various conditions that affect the sinuses, such as a cold, a sinus infection, or an allergy. Treatment for this condition depends on the cause. It may include medicine, such as antibiotics or antihistamines. This information is not intended to replace advice given to you by your health care provider. Make sure you discuss any questions you have with your health care provider. Document Revised: 07/13/2021 Document Reviewed: 07/13/2021 Elsevier Patient Education  2024 [employer] Inc[employer]Viral Respiratory Infection A respiratory infection is an illness  that affects part of the respiratory system, such as the lungs, nose, or throat. A respiratory infection that is caused by a virus is called a viral respiratory infection. Common types of viral respiratory infections include: A cold. The flu (influenza). A respiratory syncytial virus (RSV) infection. What are the causes? This condition is caused by a virus. The virus may spread through contact with droplets or direct contact with infected people or their mucus or secretions. The virus may spread from person to person (is contagious). What are the signs or symptoms? Symptoms of this condition include: A stuffy or runny nose. A sore throat or cough. Shortness of breath or difficulty breathing. Yellow or green mucus (sputum). Other  symptoms may include: A fever. Sweating or chills. Fatigue. Achy muscles. A headache. How is this diagnosed? This condition may be diagnosed based on: Your symptoms. A physical exam. Testing of secretions from the nose or throat. Chest X-ray. How is this treated? This condition may be treated with medicines, such as: Antiviral medicine. This may shorten the length of time a person has symptoms. Expectorants. These make it easier to cough up mucus. Decongestant nasal sprays. Acetaminophen  or NSAIDs, such as ibuprofen , to relieve fever and pain. Antibiotic medicines are not prescribed for viral infections.This is because antibiotics are designed to kill bacteria. They do not kill viruses. Follow these instructions at home: Managing pain and congestion Take over-the-counter and prescription medicines only as told by your health care provider. If you have a sore throat, gargle with a mixture of salt and water 3-4 times a day or as needed. To make salt water, completely dissolve -1 tsp (3-6 g) of salt in 1 cup (237 mL) of warm water. Use nose drops made from salt water to ease congestion and soften raw skin around your nose. Take 2 tsp (10 mL) of honey at bedtime to lessen coughing at night. Do not give honey to children who are younger than 1 year. Drink enough fluid to keep your urine pale yellow. This helps prevent dehydration and helps loosen up mucus. General instructions  Rest as much as possible. Do not drink alcohol. Do not use any products that contain nicotine or tobacco. These products include cigarettes, chewing tobacco, and vaping devices, such as e-cigarettes. If you need help quitting, ask your health care provider. Keep all follow-up visits. This is important. How is this prevented?     Get an annual flu shot. You may get the flu shot in late summer, fall, or winter. Ask your health care provider when you should get your flu shot. Avoid spreading your infection to  other people. If you are sick: Wash your hands with soap and water often, especially after you cough or sneeze. Wash for at least 20 seconds. If soap and water are not available, use alcohol-based hand sanitizer. Cover your mouth when you cough. Cover your nose and mouth when you sneeze. Do not share cups or eating utensils. Clean commonly used objects often. Clean commonly touched surfaces. Stay home from work or school as told by your health care provider. Avoid contact with people who are sick during cold and flu season. This is generally fall and winter. Contact a health care provider if: Your symptoms last for 10 days or longer. Your symptoms get worse over time. You have severe sinus pain in your face or forehead. The glands in your jaw or neck become very swollen. You have shortness of breath. Get help right away if you: Feel pain or  pressure in your chest. Have trouble breathing. Faint or feel like you will faint. Have severe and persistent vomiting. Feel confused or disoriented. These symptoms may represent a serious problem that is an emergency. Do not wait to see if the symptoms will go away. Get medical help right away. Call your local emergency services (911 in the [place] Do not drive yourself to the hospital. Summary A respiratory infection is an illness that affects part of the respiratory system, such as the lungs, nose, or throat. A respiratory infection that is caused by a virus is called a viral respiratory infection. Common types of viral respiratory infections include a cold, influenza, and respiratory syncytial virus (RSV) infection. Symptoms of this condition include a stuffy or runny nose, cough, fatigue, achy muscles, sore throat, and fevers or chills. Antibiotic medicines are not prescribed for viral infections. This is because antibiotics are designed to kill bacteria. They are not effective against viruses. This information is not intended to replace advice given  to you by your health care provider. Make sure you discuss any questions you have with your health care provider. Document Revised: 11/14/2020 Document Reviewed: 11/14/2020 Elsevier Patient Education  2024 [employer].

## 2024-01-25 NOTE — Progress Notes (Signed)
 Established Patient Office Visit  Subjective   Patient ID: Danielle Cooke, female    DOB: 07-13-1976  Age: 48 y.o. MRN: [id number]  Chief Complaint  Patient presents with   Sinusitis    48y/o caucasian female single established patient mother sick with cold last week and she has sinus pressure, post nasal drip, congestion taking alkaseltzer OTC, nasal saline and started flonase nasal then ran out.  Restarted oral antihistamine 3 days ago and helping.  Ears full no discharge.  Teeth and maxillary sinus pain.  Home covid test negative this week  Feeling warm today but afebrile on temperature with RN.  Some cough denied wheezing  Patient also notified NP she saw dermatology and had skin biopsy shave and punch completed as rash on thigh has spread to posterior thigh and itchy.  Diagnosed with erythema annulare centrifugum and continued treatment with topical corticosteroid and emollient.     Review of Systems  Constitutional:  Negative for chills, diaphoresis and fever.  HENT:  Positive for congestion, ear pain, sinus pain and sore throat. Negative for ear discharge, hearing loss, nosebleeds and tinnitus.   Eyes:  Negative for pain, discharge and redness.  Respiratory:  Positive for cough and sputum production. Negative for hemoptysis, shortness of breath, wheezing and stridor.   Cardiovascular:  Negative for chest pain.  Gastrointestinal:  Negative for diarrhea and vomiting.  Musculoskeletal:  Negative for back pain, myalgias and neck pain.  Skin:  Positive for itching and rash.  Neurological:  Negative for dizziness, tingling, tremors and headaches.  Endo/Heme/Allergies:  Positive for environmental allergies.  Psychiatric/Behavioral:  The patient does not have insomnia.       Objective:     BP 104/72 (BP Location: Left Arm, Patient Position: Sitting, Cuff Size: Normal)   Pulse 82   Temp (!) 96.3 F (35.7 C) (Tympanic)   SpO2 98%    Physical Exam Vitals and nursing note  reviewed.  Constitutional:      General: She is not in acute distress.    Appearance: She is well-developed. She is obese. She is ill-appearing. She is not toxic-appearing or diaphoretic.  HENT:     Head: Normocephalic and atraumatic.     Right Ear: Hearing, ear canal and external ear normal. No decreased hearing noted. No laceration, drainage, swelling or tenderness. A middle ear effusion is present. There is no impacted cerumen. No foreign body. No mastoid tenderness. No PE tube. No hemotympanum. Tympanic membrane is not injected, scarred, perforated, erythematous, retracted or bulging.     Left Ear: Hearing, ear canal and external ear normal. No decreased hearing noted. No laceration, drainage, swelling or tenderness. A middle ear effusion is present. There is no impacted cerumen. No foreign body. No mastoid tenderness. No PE tube. No hemotympanum. Tympanic membrane is not injected, scarred, perforated, erythematous, retracted or bulging.     Ears:     Comments: Bilateral TMs intact air fluid level clear; no debris noted bilateral auditory canals    Nose: Congestion and rhinorrhea present. Rhinorrhea is clear.     Right Nostril: No epistaxis.     Left Nostril: No epistaxis.     Right Turbinates: Enlarged and swollen. Not pale.     Left Turbinates: Enlarged and swollen. Not pale.     Right Sinus: Maxillary sinus tenderness and frontal sinus tenderness present.     Left Sinus: Maxillary sinus tenderness and frontal sinus tenderness present.     Comments: Mildly TTP maxillary greater than frontal bilateral sinuses;  clear discharge bilateral nasal turbinates edema erythema; cobblestoning posterior pharynx    Mouth/Throat:     Lips: Pink. No lesions.     Mouth: Mucous membranes are moist. No oral lesions or angioedema.     Dentition: No gum lesions.     Tongue: No lesions. Tongue does not deviate from midline.     Palate: No mass and lesions.     Pharynx: Uvula midline. Pharyngeal swelling and  postnasal drip present. No oropharyngeal exudate, posterior oropharyngeal erythema or uvula swelling.     Tonsils: No tonsillar exudate.  Eyes:     General: Lids are normal. Allergic shiner present. No scleral icterus.       Right eye: No discharge.        Left eye: No discharge.     Extraocular Movements: Extraocular movements intact.     Conjunctiva/sclera: Conjunctivae normal.     Pupils: Pupils are equal, round, and reactive to light.  Neck:     Trachea: Trachea and phonation normal.  Cardiovascular:     Rate and Rhythm: Normal rate and regular rhythm.     Pulses: Normal pulses.          Radial pulses are 2+ on the right side and 2+ on the left side.     Heart sounds: Normal heart sounds, S1 normal and S2 normal.  Pulmonary:     Effort: Pulmonary effort is normal. No respiratory distress.     Breath sounds: Normal breath sounds and air entry. No stridor. No wheezing, rhonchi or rales.     Comments: Intermittent sp02 100% in exam room; nonproductive cough/throat clearing in exam room; spoke full sentences without difficulty Abdominal:     Palpations: Abdomen is soft.  Musculoskeletal:        General: Normal range of motion.     Right hand: Normal strength. Normal capillary refill.     Left hand: Normal strength. Normal capillary refill.     Cervical back: Normal range of motion and neck supple. No swelling, edema, deformity, erythema, signs of trauma, lacerations, rigidity, spasms, torticollis, tenderness or crepitus. No pain with movement or muscular tenderness. Normal range of motion.     Thoracic back: No swelling, edema, deformity, signs of trauma, lacerations, spasms or tenderness. Normal range of motion.     Right lower leg: No edema.     Left lower leg: No edema.  Lymphadenopathy:     Head:     Right side of head: No submental, submandibular, tonsillar, preauricular, posterior auricular or occipital adenopathy.     Left side of head: No submental, submandibular, tonsillar,  preauricular, posterior auricular or occipital adenopathy.     Cervical: No cervical adenopathy.     Right cervical: No superficial, deep or posterior cervical adenopathy.    Left cervical: No superficial, deep or posterior cervical adenopathy.  Skin:    General: Skin is warm and dry.     Capillary Refill: Capillary refill takes less than 2 seconds.     Coloration: Skin is not ashen, cyanotic, jaundiced, mottled, pale or sallow.     Findings: No abrasion, abscess, acne, bruising, burn, ecchymosis, erythema, signs of injury, laceration, lesion, petechiae or wound.     Nails: There is no clubbing.     Comments: Anterior face/neck and hands visually inspected  Neurological:     General: No focal deficit present.     Mental Status: She is alert and oriented to person, place, and time. Mental status is at baseline.  GCS: GCS eye subscore is 4. GCS verbal subscore is 5. GCS motor subscore is 6.     Cranial Nerves: Cranial nerves 2-12 are intact. No cranial nerve deficit, dysarthria or facial asymmetry.     Sensory: Sensation is intact.     Motor: Motor function is intact. No weakness, tremor, atrophy, abnormal muscle tone or seizure activity.     Coordination: Coordination is intact. Coordination normal.     Gait: Gait normal.     Comments: On/off exam table and in/out of chair without difficulty; bilateral hand grasp equal 5/5 and gait sure and steady in clinic  Psychiatric:        Attention and Perception: Attention and perception normal.        Mood and Affect: Mood and affect normal.        Speech: Speech normal.        Behavior: Behavior normal. Behavior is cooperative.        Thought Content: Thought content normal.        Cognition and Memory: Cognition and memory normal.        Judgment: Judgment normal.     No results found for any visits on 01/25/24.    The 10-year ASCVD risk score (Arnett DK, et al., 2019) is: 1.5%    Assessment & Plan:   Problem List Items Addressed  This Visit   None Visit Diagnoses       Acute rhinosinusitis    -  Primary     May continue alkaseltzer OTC per manufacturer instructions discussed it has acetaminophen  (tylenol ), doxyalamine, phenylephrine and dextromethorphan.  Restart flonase 1 spray each nostril BID, oral antihistamine daily of choice e.g. claritin or zyrtec 10mg ;  ibuprofen  800mg  po q8h prn pain take with food; saline 2 sprays each nostril q2h wa prn congestion. Denied personal or family history of ENT cancer.  Shower BID especially prior to bed. No evidence of systemic bacterial infection, non toxic and well hydrated.  I do not see where any further testing or imaging is necessary at this time.   I will suggest supportive care, rest, good hygiene and encourage the patient to take adequate fluids.  The patient is to return to clinic or EMERGENCY ROOM if symptoms worsen or change significantly.  Exitcare handout on sinusitis and sinus rinse.  Discussed viral URI common in local area at this time adenovirus, covid, flu mucous clear to yellow to green then done.  If opaque like milk, tan, bloody to notify clinic staff.  Patient verbalized agreement and understanding of treatment plan and had no further questions at this time.   P2:  Hand washing and cover cough   Return if symptoms worsen or fail to improve.    Richardine Chancy, NP

## 2024-02-21 DIAGNOSIS — R0683 Snoring: Secondary | ICD-10-CM | POA: Insufficient documentation

## 2024-03-27 ENCOUNTER — Other Ambulatory Visit: Payer: Self-pay | Admitting: Internal Medicine

## 2024-03-27 DIAGNOSIS — R921 Mammographic calcification found on diagnostic imaging of breast: Secondary | ICD-10-CM

## 2024-03-30 ENCOUNTER — Inpatient Hospital Stay
Admission: RE | Admit: 2024-03-30 | Discharge: 2024-03-30 | Discharge status: Home / Self Care | Source: Ambulatory Visit

## 2024-03-30 DIAGNOSIS — R921 Mammographic calcification found on diagnostic imaging of breast: Secondary | ICD-10-CM

## 2024-05-11 ENCOUNTER — Other Ambulatory Visit (HOSPITAL_BASED_OUTPATIENT_CLINIC_OR_DEPARTMENT_OTHER): Payer: Self-pay

## 2024-05-11 MED ORDER — OZEMPIC (1 MG/DOSE) 4 MG/3ML ~~LOC~~ SOPN
1.0000 mg | PEN_INJECTOR | SUBCUTANEOUS | 0 refills | Status: AC
  Filled 2024-05-11: qty 3, 28d supply, fill #0

## 2024-05-11 MED ORDER — ATORVASTATIN CALCIUM 40 MG PO TABS
40.0000 mg | ORAL_TABLET | Freq: Every evening | ORAL | 3 refills | Status: DC
  Filled 2024-05-11: qty 90, 90d supply, fill #0

## 2024-05-12 ENCOUNTER — Other Ambulatory Visit (HOSPITAL_BASED_OUTPATIENT_CLINIC_OR_DEPARTMENT_OTHER): Payer: Self-pay

## 2024-05-22 ENCOUNTER — Other Ambulatory Visit (HOSPITAL_BASED_OUTPATIENT_CLINIC_OR_DEPARTMENT_OTHER): Payer: Self-pay

## 2024-05-25 ENCOUNTER — Other Ambulatory Visit (HOSPITAL_BASED_OUTPATIENT_CLINIC_OR_DEPARTMENT_OTHER): Payer: Self-pay

## 2024-05-25 MED ORDER — ATORVASTATIN CALCIUM 40 MG PO TABS
40.0000 mg | ORAL_TABLET | Freq: Every evening | ORAL | 3 refills | Status: AC
  Filled 2024-05-25: qty 90, 90d supply, fill #0

## 2024-05-29 ENCOUNTER — Other Ambulatory Visit (HOSPITAL_BASED_OUTPATIENT_CLINIC_OR_DEPARTMENT_OTHER): Payer: Self-pay

## 2024-06-05 ENCOUNTER — Other Ambulatory Visit (HOSPITAL_BASED_OUTPATIENT_CLINIC_OR_DEPARTMENT_OTHER): Payer: Self-pay

## 2024-06-06 ENCOUNTER — Ambulatory Visit: Payer: Self-pay | Admitting: Registered Nurse

## 2024-06-06 ENCOUNTER — Other Ambulatory Visit: Payer: Self-pay | Admitting: Registered Nurse

## 2024-06-06 ENCOUNTER — Ambulatory Visit: Admitting: Registered Nurse

## 2024-06-06 VITALS — BP 112/68 | HR 74 | Temp 98.1°F | Resp 16

## 2024-06-06 DIAGNOSIS — J4520 Mild intermittent asthma, uncomplicated: Secondary | ICD-10-CM

## 2024-06-06 DIAGNOSIS — R300 Dysuria: Secondary | ICD-10-CM

## 2024-06-06 DIAGNOSIS — J301 Allergic rhinitis due to pollen: Secondary | ICD-10-CM

## 2024-06-06 LAB — POCT URINALYSIS DIPSTICK
Bilirubin, UA: NEGATIVE
Glucose, UA: NEGATIVE
Ketones, UA: NEGATIVE
Protein, UA: POSITIVE — AB
Spec Grav, UA: 1.015 (ref 1.010–1.025)
Urobilinogen, UA: 0.2 U/dL
pH, UA: 6 (ref 5.0–8.0)

## 2024-06-06 MED ORDER — SALINE SPRAY 0.65 % NA SOLN: 2.0000 | NASAL | 0 refills | Status: AC

## 2024-06-06 MED ORDER — SULFAMETHOXAZOLE-TRIMETHOPRIM 800-160 MG PO TABS: 1.0000 | ORAL_TABLET | Freq: Two times a day (BID) | ORAL | 0 refills | Status: AC

## 2024-06-06 MED ORDER — MONTELUKAST SODIUM 10 MG PO TABS: 10.0000 mg | ORAL_TABLET | Freq: Every day | ORAL | 3 refills | Status: AC

## 2024-06-06 MED ORDER — PHENAZOPYRIDINE HCL 200 MG PO TABS: 200.0000 mg | ORAL_TABLET | Freq: Three times a day (TID) | ORAL | Status: AC

## 2024-06-06 NOTE — Progress Notes (Signed)
 Established Patient Office Visit  Subjective   Patient ID: Danielle Cooke, Danielle    DOB: 1975-12-03  Age: 48 y.o. MRN: [id number]  Chief Complaint  Patient presents with   Dysuria    Urgency and dysuria    48y/o caucasian Danielle established patient here for evaluation for UTI.  Has noticed smelly urine this am and some pain with urination the past 24 hours.  Urine yellow.  Started azo last night OTC.  Denied n/v/fever/chills/back pain different from her baseline/rashes/swelling feet/hands/legs/headache.  Has had intermittent diarrhea with ozempic  use.  Stools irregular and less frequent since starting ozempic   Last injection 3 weeks ago but typically gets diarrhea once a week still.  Has not been checking home blood sugars recently.  Last Hgba1c well controlled.  Was on vacation to [place] and [place],  last week thinks she was dehydrated due to travel.  Has not been treated with antibiotics for UTI in this clinic in the past.  Patient needs refill on singulair  denied problems with use and working well for her allergies and asthma.      Review of Systems  Constitutional:  Negative for chills, diaphoresis, fever, malaise/fatigue and weight loss.  Respiratory:  Negative for shortness of breath and wheezing.   Cardiovascular:  Negative for leg swelling.  Gastrointestinal:  Positive for diarrhea. Negative for heartburn, nausea and vomiting.  Genitourinary:  Positive for dysuria. Negative for flank pain and hematuria.  Musculoskeletal:  Positive for back pain.  Skin:  Negative for itching and rash.  Neurological:  Negative for dizziness and headaches.  Psychiatric/Behavioral:  The patient does not have insomnia.       Objective:     BP 112/68 (BP Location: Left Arm, Patient Position: Sitting, Cuff Size: Large)   Pulse 74   Temp 98.1 F (36.7 C) (Tympanic)   Resp 16   SpO2 98%    Physical Exam Vitals and nursing note reviewed.  Constitutional:      General: She is  awake. She is not in acute distress.    Appearance: Normal appearance. She is well-developed and well-groomed. She is obese. She is not ill-appearing, toxic-appearing or diaphoretic.  HENT:     Head: Normocephalic and atraumatic.     Jaw: There is normal jaw occlusion.     Salivary Glands: Right salivary gland is not diffusely enlarged. Left salivary gland is not diffusely enlarged.     Right Ear: Hearing and external ear normal. No decreased hearing noted. No laceration, drainage or swelling.     Left Ear: Hearing and external ear normal. No decreased hearing noted. No laceration, drainage or swelling.     Nose: Nose normal. No congestion or rhinorrhea.     Right Nostril: No epistaxis.     Left Nostril: No epistaxis.     Right Turbinates: Not enlarged or swollen.     Left Turbinates: Not enlarged or swollen.     Mouth/Throat:     Lips: Pink. No lesions.     Mouth: Mucous membranes are moist. No oral lesions or angioedema.     Dentition: No gum lesions.     Tongue: No lesions. Tongue does not deviate from midline.     Palate: No mass and lesions.     Pharynx: Oropharynx is clear. Uvula midline. No pharyngeal swelling, oropharyngeal exudate, posterior oropharyngeal erythema or uvula swelling.     Tonsils: No tonsillar exudate.  Eyes:     General: Lids are normal. Vision grossly intact. Gaze aligned appropriately.  Allergic shiner present. No scleral icterus.       Right eye: No discharge.        Left eye: No discharge.     Extraocular Movements: Extraocular movements intact.     Conjunctiva/sclera: Conjunctivae normal.     Pupils: Pupils are equal, round, and reactive to light.  Neck:     Trachea: Trachea and phonation normal.  Cardiovascular:     Rate and Rhythm: Normal rate and regular rhythm.     Pulses: Normal pulses.          Radial pulses are 2+ on the right side and 2+ on the left side.     Heart sounds: Normal heart sounds, S1 normal and S2 normal.  Pulmonary:     Effort:  Pulmonary effort is normal. No respiratory distress.     Breath sounds: Normal breath sounds and air entry. No stridor or transmitted upper airway sounds. No decreased breath sounds, wheezing, rhonchi or rales.     Comments: Spoke full sentences without difficulty; no cough observed in exam room Abdominal:     General: Abdomen is flat. Bowel sounds are decreased. There is no distension or abdominal bruit. There are no signs of injury.     Palpations: Abdomen is soft. There is no shifting dullness, fluid wave, hepatomegaly, splenomegaly, mass or pulsatile mass.     Tenderness: There is abdominal tenderness in the left lower quadrant. There is no right CVA tenderness, left CVA tenderness, guarding or rebound. Negative signs include Murphy's sign.     Hernia: No hernia is present. There is no hernia in the umbilical area or ventral area.     Comments: Mildly TTP LLQ; abdomen soft hypoactive bowel sounds ate banana for breakfast today; on/off exam table without difficulty sitting/standing/lying and returned without assistance  Musculoskeletal:        General: Tenderness present. No swelling or signs of injury. Normal range of motion.     Right hand: Normal strength. Normal capillary refill.     Left hand: Normal strength. Normal capillary refill.     Cervical back: Normal range of motion and neck supple. No swelling, edema, deformity, erythema, signs of trauma, lacerations, rigidity, spasms, torticollis, tenderness, bony tenderness or crepitus. No pain with movement or muscular tenderness. Normal range of motion.     Thoracic back: No swelling, edema, deformity, signs of trauma, lacerations, spasms or tenderness. Normal range of motion.     Lumbar back: No swelling, edema, deformity, signs of trauma, lacerations, spasms or tenderness. Normal range of motion.     Right lower leg: No swelling, deformity, lacerations or tenderness. No edema.     Left lower leg: Tenderness present. No swelling, deformity or  lacerations. No edema.     Right ankle: No swelling, deformity, ecchymosis or lacerations.     Left ankle: No swelling, deformity, ecchymosis or lacerations.     Comments: LLE mildly TTP no edema erythema or contusion noted when checking for leg edema  Lymphadenopathy:     Head:     Right side of head: No submandibular or preauricular adenopathy.     Left side of head: No submandibular or preauricular adenopathy.     Cervical: No cervical adenopathy.     Right cervical: No superficial, deep or posterior cervical adenopathy.    Left cervical: No superficial, deep or posterior cervical adenopathy.  Skin:    General: Skin is warm and dry.     Capillary Refill: Capillary refill takes less than 2  seconds.     Coloration: Skin is not ashen, cyanotic, jaundiced, mottled, pale or sallow.     Findings: No abrasion, abscess, acne, bruising, burn, ecchymosis, erythema, signs of injury, laceration, lesion, petechiae, rash or wound.     Nails: There is no clubbing.     Comments: Anterior face/neck/hands/ankles bilateral without rash/erythema/ecchymosis or edema  Neurological:     General: No focal deficit present.     Mental Status: She is alert and oriented to person, place, and time. Mental status is at baseline.     GCS: GCS eye subscore is 4. GCS verbal subscore is 5. GCS motor subscore is 6.     Cranial Nerves: No cranial nerve deficit, dysarthria or facial asymmetry.     Motor: Motor function is intact. No weakness, tremor, atrophy, abnormal muscle tone or seizure activity.     Coordination: Coordination is intact. Coordination normal.     Gait: Gait is intact. Gait normal.     Comments: In/out of chair without difficulty; gait sure and steady in clinic; bilateral hand grasp equal 5/5  Psychiatric:        Attention and Perception: Attention and perception normal.        Mood and Affect: Mood and affect normal.        Speech: Speech normal.        Behavior: Behavior normal. Behavior is  cooperative.        Thought Content: Thought content normal.        Cognition and Memory: Cognition and memory normal.        Judgment: Judgment normal.    Discussed with patient dipstick positive for blood, protein, leukocytes, smell and cloudy.   Increase water intake to keep urine pale yellow clear and voiding every 2-4 hours while awake.  Start bactrim DS po BID x 3 days dispensed 40 tabs from [facility] to patient today discussed will have leftover pills.  Hold atorvastatin  today may take tomorrow and hold the following day as bactrim DS and atorvastatin  interact to increase blood levels of atorvastatin . Discussed will send my chart message with urine culture results and call if antibiotic resistance seen on urine culture and medications needs to be changed.   Patient agreed with plan of care and had no further questions at this time.  Results for orders placed or performed in visit on 06/06/24  POCT Urinalysis Dipstick (CPT 81002)  Result Value Ref Range   Color, UA yellow    Clarity, UA cloudy    Glucose, UA Negative Negative   Bilirubin, UA negative    Ketones, UA negative    Spec Grav, UA 1.015 1.010 - 1.025   Blood, UA large    pH, UA 6.0 5.0 - 8.0   Protein, UA Positive (A) Negative   Urobilinogen, UA 0.2 0.2 or 1.0 E.U./dL   Nitrite, UA     Leukocytes, UA Moderate (2+) (A) Negative   Appearance cloudy    Odor strong       The ASCVD Risk score (Arnett DK, et al., 2019) failed to calculate for the following reasons:   The valid total cholesterol range is 130 to 320 mg/dL    Assessment & Plan:   Problem List Items Addressed This Visit       Respiratory   Seasonal allergic rhinitis   Other Visit Diagnoses       Dysuria    -  Primary   Relevant Orders   POCT Urinalysis Dipstick (CPT 81002) (Completed)  Urine Culture     Mild intermittent asthma, unspecified whether complicated       Relevant Medications   montelukast  (SINGULAIR ) 10 MG tablet     POCT dipstick x 1  now and if positive send for urine culture to [facility].  Discussed with patient will take 1-5 days for results from [facility] depending on if bacteria slow or fast growing.  Discussed clean catch urine procedure wipe start stream then put bottle in stream and remove from stream prior to stopping urination.  Medications as directed. Patient is also to push fluids and may use Pyridium 200mg  po TID as needed x48 hours OTC  Discussed it will cause urine to become gold/orange colored and can stain clothes/surfaces. Hydrate, avoid dehydration. Avoid holding urine void on frequent basis every 4 to 6 hours. If unable to void every 8 hours follow up for re-evaluation with PCM, urgent care or ER. Call or return to clinic as needed if these symptoms worsen or fail to improve as anticipated.  ER if tea/cola colored urine, unable to urinate every 8 hours or gross hematuria.   Exitcare handout on dysuria and UTI.  Discussed with patient sometimes dysuria from irritation/glucose in urine.  Patient verbalized agreement and understanding of treatment plan and had no further questions at this time.  P2: Hydrate and cranberry juice    Electronic Rx sent to her pharmacy of choice singulair  10mg  po at bedtime #90 RF3.  Discussed nasal saline use 2 sprays each nostril q2h prn congestion/thick mucous.  Patient stated she tried high dose saline and does not like as well returning to regular strength. Discussed ragweed season at this time and temperature changes can cause asthma flare ups.  Exitcare handout preventing asthma attacks.   Patient agreed with plan of care and had no further questions at this time.  Return if symptoms worsen or fail to improve.    Danielle DELENA Hope, Danielle Cooke

## 2024-06-06 NOTE — Patient Instructions (Addendum)
 Dysuria Dysuria is pain or discomfort when you pee. The pain may be felt in your urethra, which is the part of your body that drains pee (urine) from your bladder. The pain may also be felt near your genitals, groin, or in your lower belly or back. You may have to pee often or have the sudden feeling that you need to pee. This condition can affect anyone, but it's more common in females. It can be caused by: A urinary tract infection (UTI). Kidney stones or bladder stones. Some sexually transmitted infections (STIs). Dehydration. This is when there's not enough water in your body. Irritation and swelling in the vagina. The use of some medicines. The use of some soaps or products with a scent. Follow these instructions at home: Medicines  Take your medicines only as told. Take your antibiotics as told. Do not stop taking them even if you start to feel better. Eating and drinking Drink enough fluid to keep your pee pale yellow. Certain drinks can make the pain worse. Avoid: Drinks with caffeine in them. Tea. Alcohol. In males, alcohol may irritate the prostate. General instructions Watch your condition for any changes, such as color changes in your pee. Pee often. Do not hold your pee for a long time. If you're female, wipe from front to back after you pee or poop. Use each tissue only once when you wipe. Pee after you have sex. If you've had any tests done, it's up to you to get your test results. Ask your health care provider, or the department doing the test, when your results will be ready. Contact a health care provider if: You have a fever or chills. You have pain in your back or sides. You throw up or feel like you may throw up. You have blood in your pee. You're not peeing as often as normal. You feel very weak. Get help right away if: You have very bad pain that doesn't get better with medicine. You're confused. You have a fast heartbeat while resting. This information is  not intended to replace advice given to you by your health care provider. Make sure you discuss any questions you have with your health care provider. Document Revised: 12/15/2022 Document Reviewed: 12/15/2022 Elsevier Patient Education  2024 Else[employer] Inc.Urinary Tract Infection, Female A urinary tract infection (UTI) is an infection in your urinary tract. The urinary tract is made up of organs that make, store, and get rid of pee (urine) in your body. These organs include: The kidneys. The ureters. The bladder. The urethra. What are the causes? Most UTIs are caused by germs called bacteria. They may be in or near your genitals. These germs grow and cause swelling in your urinary tract. What increases the risk? You're more likely to get a UTI if: You're a female. The urethra is shorter in females than in males. You have a soft tube called a catheter that drains your pee. You can't control when you pee or poop. You have trouble peeing because of: A kidney stone. A urinary blockage. A nerve condition that affects your bladder. Not getting enough to drink. You're sexually active. You use a birth control inside your vagina, like spermicide. You're pregnant. You have low levels of the hormone estrogen in your body. You're an older adult. You're also more likely to get a UTI if you have other health problems. These may include: Diabetes. A weak immune system. Your immune system is your body's defense system. Sickle cell disease. Injury of the  spine. What are the signs or symptoms? Symptoms may include: Needing to pee right away. Peeing small amounts often. Pain or burning when you pee. Blood in your pee. Pee that smells bad or odd. Pain in your belly or lower back. You may also: Feel confused. This may be the first symptom in older adults. Vomit. Not feel hungry. Feel tired or easily annoyed. Have a fever or chills. How is this diagnosed? A UTI is diagnosed based on your  medical history and an exam. You may also have other tests. These may include: Pee tests. Blood tests. Tests for sexually transmitted infections (STIs). If you've had more than one UTI, you may need to have imaging studies done to find out why you keep getting them. How is this treated? A UTI can be treated by: Taking antibiotics or other medicines. Drinking enough fluid to keep your pee pale yellow. In rare cases, a UTI can cause a very bad condition called sepsis. Sepsis may be treated in the hospital. Follow these instructions at home: Medicines Take your medicines only as told by your health care provider. If you were given antibiotics, take them as told by your provider. Do not stop taking them even if you start to feel better. General instructions Make sure you: Pee often and fully. Do not hold your pee for a long time. Wipe from front to back after you pee or poop. Use each tissue only once when you wipe. Pee after you have sex. Do not douche or use sprays or powders in your genital area. Contact a health care provider if: Your symptoms don't get better after 1-2 days of taking antibiotics. Your symptoms go away and then come back. You have a fever or chills. You vomit or feel like you may vomit. Get help right away if: You have very bad pain in your back or lower belly. You faint. This information is not intended to replace advice given to you by your health care provider. Make sure you discuss any questions you have with your health care provider. Document Revised: 07/21/2023 Document Reviewed: 11/13/2022 Elsevier Patient Education  2025 Elsevier Inc.  Asthma Attack Prevention, Adult Although you may not be able to change the fact that you have asthma, you can take actions to prevent episodes of asthma (asthma attacks). How can this condition affect me? Asthma attacks (flare ups) can cause trouble breathing, high-pitched whistling sounds when you breathe, most often when  you breathe out (wheeze), and coughing. They may keep you from doing activities you like to do. What can increase my risk? Coming into contact with things that cause asthma symptoms (asthma triggers) can put you at risk for an asthma attack. Common asthma triggers include: Things you are allergic to (allergens), such as: Dust mite and cockroach droppings. Pet dander. Mold. Pollen from trees and grasses. Food allergies. This might be a specific food or added chemicals called sulfites. Irritants, such as: Weather changes including very cold, dry, or humid air. Smoke. This includes campfire smoke, air pollution, and tobacco smoke. Strong odors from aerosol sprays and fumes from perfume, candles, and household cleaners. Other triggers, such as: Certain medicines. This includes NSAIDs, such as ibuprofen  and aspirin. Viral respiratory infections (colds), including runny nose (rhinitis) or infection in the sinuses (sinusitis). Activity, including exercise, laughing, or crying. Not using inhaled medicines (corticosteroids) as told. What actions can I take to prevent an asthma attack? Stay healthy. Stay up to date on all immunizations as told by your health  care provider, including the yearly flu (influenza) vaccine and pneumonia vaccine. Many asthma attacks can be prevented by carefully following your written asthma action plan. Follow your asthma action plan Work with your health care provider to create a written asthma action plan. This plan should include: A list of your asthma triggers and how to avoid or reduce them. A list of symptoms that you may have during an asthma attack. Information about which medicine to take, when to take the medicine, and how much of the medicine to take. Information to help you understand your peak flow measurements. Daily actions that you can take to prevent (control) your asthma symptoms. Contact information for your health care providers. If you have an  asthma attack, act quickly. Follow the emergency steps on your written asthma action plan. This may prevent you from needing to go to the hospital. Monitor your asthma. To do this: Use your peak flow meter every morning and every evening for 2-3 weeks or as told by your health care provider. Record the results in a journal. A drop in your peak flow numbers on one or more days may mean that you are starting to have an asthma attack, even if you are not having symptoms. When you have asthma symptoms, write them down in a journal. Write down in your journal how often you need to use your fast-acting rescue inhaler. If you are using your rescue inhaler more often, it may mean that your asthma is not under control. Talk with your health care provider about adjusting your asthma treatment plan to help you prevent future asthma attacks and gain better control of your condition.  Lifestyle Avoid or reduce contact with known outdoor allergens by staying indoors, keeping windows closed, and using air conditioning when pollen and mold counts are high. Do not use any products that contain nicotine or tobacco. These products include cigarettes, chewing tobacco, and vaping devices, such as e-cigarettes. If you need help quitting, ask your health care provider. If you are overweight, consider a weight-loss plan. Find ways to cope with stress and your feelings, such as mindfulness, relaxation, or breathing exercises. Ask your health care provider if a breathing exercise program (pulmonary rehabilitation) may be helpful to control symptoms and improve your quality of life. Medicines  Take over-the-counter and prescription medicines only as told by your health care provider. Do not stop taking your medicine and do not take less medicine even if you start to feel better. Let your health care provider know: How often you use your rescue inhaler. How often you have symptoms when you are taking your regular  medicines. If you wake up at night because of asthma symptoms. If you have more trouble with your breathing when you exercise. Activity Do your normal activities as told by your health care provider. Ask your health care provider what activities are safe for you. Some people have asthma symptoms or more asthma symptoms when they exercise. This is called exercise-induced bronchoconstriction (EIB). If you have this problem, talk with your health care provider about how to manage EIB. Some tips to follow include: Use your fast-acting inhaler before exercise. Exercise indoors if it is very cold, humid, or the pollen and mold counts are high. Warm up and cool down before and after exercise. Stop exercising right away if your asthma symptoms start or get worse. Where to find more information Asthma and Allergy Foundation of America: www.aafa.org Centers for Disease Control and Prevention: FootballExhibition.com.br American Lung Association: www.lung.org National  Heart, Lung, and Blood Institute: PopSteam.is World Health Organization: https://castaneda-walker.com/ Get help right away if: You have followed your written asthma action plan and your symptoms are not improving. Summary Asthma attacks (flare ups) can cause trouble breathing, high-pitched whistling sounds when you breathe, most often when you breathe out (wheeze), and coughing. Work with your health care provider to create a written asthma action plan. Do not stop taking your medicine and do not take less medicine even if you are feeling better. Do not use any products that contain nicotine or tobacco. These products include cigarettes, chewing tobacco, and vaping devices, such as e-cigarettes. If you need help quitting, ask your health care provider. This information is not intended to replace advice given to you by your health care provider. Make sure you discuss any questions you have with your health care provider. Document Revised: 02/05/2021 Document  Reviewed: 02/05/2021 Elsevier Patient Education  2024 [employer].

## 2024-06-07 ENCOUNTER — Ambulatory Visit: Admitting: General Practice

## 2024-06-07 DIAGNOSIS — Z23 Encounter for immunization: Secondary | ICD-10-CM | POA: Insufficient documentation

## 2024-06-07 MED ORDER — COVID-19 MRNA VAC-TRIS(PFIZER) 30 MCG/0.3ML IM SUSY: 0.3000 mL | PREFILLED_SYRINGE | Freq: Once | INTRAMUSCULAR | 0 refills | Status: AC

## 2024-06-07 NOTE — Progress Notes (Signed)
 RN immunized EE with Comirnaty mRNA COVID vaccine 0.3 mL, in Lt deltoid. Lot # [id number], Pfizer, NDC # [id number], exp date 02/09/2025, VIS dated 09/24/2023.

## 2024-06-09 ENCOUNTER — Ambulatory Visit: Payer: Self-pay | Admitting: Registered Nurse

## 2024-06-09 DIAGNOSIS — N390 Urinary tract infection, site not specified: Secondary | ICD-10-CM

## 2024-06-11 LAB — URINE CULTURE

## 2024-06-13 ENCOUNTER — Ambulatory Visit: Admitting: General Practice

## 2024-06-13 VITALS — BP 106/62 | HR 82 | Temp 98.2°F | Resp 16

## 2024-06-13 DIAGNOSIS — R051 Acute cough: Secondary | ICD-10-CM | POA: Insufficient documentation

## 2024-06-13 NOTE — Progress Notes (Signed)
 Employee presents to the clinic this morning with c/o of runny nose, chest congestion, shortness of breath on exertion, cough, sore throat and feeling tired. States did a nasal aline rinse, took Tylenol  and took allergy meds this morning. Took a COVID test at home at home last night, which was negative. Doesn't know if the tests are expired. Desires to take another COVID test this am. History of Diabetes, states I usually don't check my sugar.  VSS, see documentation. RN completed a COVID-19 AG Card test with a negative result. Attempted CBG, glucometer reading E-4, X two attempts.  Encouraged hydration, to drink water after coffee (dehydrates), eat breakfast and regular meals. Has current strips for glucometer, advised to check CBGs at least once a week to have a baseline to measure against in the future.

## 2024-06-15 ENCOUNTER — Other Ambulatory Visit (HOSPITAL_BASED_OUTPATIENT_CLINIC_OR_DEPARTMENT_OTHER): Payer: Self-pay

## 2024-06-17 ENCOUNTER — Ambulatory Visit: Admission: EM | Admit: 2024-06-17 | Discharge: 2024-06-17 | Disposition: A | Discharge status: Home / Self Care

## 2024-06-17 ENCOUNTER — Ambulatory Visit (INDEPENDENT_AMBULATORY_CARE_PROVIDER_SITE_OTHER)

## 2024-06-17 DIAGNOSIS — B349 Viral infection, unspecified: Secondary | ICD-10-CM | POA: Diagnosis not present

## 2024-06-17 DIAGNOSIS — R051 Acute cough: Secondary | ICD-10-CM

## 2024-06-17 MED ORDER — PREDNISONE 20 MG PO TABS: 40.0000 mg | ORAL_TABLET | Freq: Every day | ORAL | 0 refills | Status: AC

## 2024-06-17 MED ORDER — AZELASTINE HCL 0.1 % NA SOLN: 1.0000 | Freq: Two times a day (BID) | NASAL | 1 refills | Status: AC

## 2024-06-17 NOTE — ED Triage Notes (Signed)
 Patient reports the onset of symptoms starting on Monday, including a sore throat. A COVID-19 test was conducted and returned negative. The following day, the patient went to work, where the office nurse performed another COVID-19 test, which was also negative. The patient is currently taking Mucinex and Tylenol . Symptoms now include a productive cough and occasional sore throat, but no known fever. Pulse oximeter readings at home have been normal. The patient has a history of asthma but has not used a rescue inhaler.

## 2024-06-17 NOTE — ED Provider Notes (Signed)
 [facility]  Note:  This document was prepared using Conservation officer, historic buildings and may include unintentional dictation errors.  MRN: [id number] DOB: 1975-12-31  Subjective:   Danielle Cooke is a 48 y.o. female presenting for severe sore throat, nasal congestion, persistent cough, production with cough, since Monday.  Patient reports taking 2 COVID test both negative on Tuesday and Wednesday.  Patient has been taking Mucinex and Tylenol  with minimal improvement to symptoms.  Patient denies any known fever, body aches, severe fatigue.  Patient does report history of asthma but has not used rescue inhaler.  Patient states that when she lays down she hears crackling in her chest and upper airway and mild wheezing.  No current shortness of breath, chest pain, weakness, dizziness.  No current facility-administered medications for this encounter.  Current Outpatient Medications:    acetaminophen  (TYLENOL ) 325 MG tablet, Take 650 mg by mouth every 6 (six) hours as needed., Disp: , Rfl:    azelastine (ASTELIN) 0.1 % nasal spray, Place 1 spray into both nostrils 2 (two) times daily. Use in each nostril as directed, Disp: 30 mL, Rfl: 1   guaiFENesin (MUCINEX PO), Take by mouth., Disp: , Rfl:    predniSONE (DELTASONE) 20 MG tablet, Take 2 tablets (40 mg total) by mouth daily for 5 days., Disp: 10 tablet, Rfl: 0   albuterol  (VENTOLIN  HFA) 108 (90 Base) MCG/ACT inhaler, Inhale 1-2 puffs into the lungs every 4 (four) hours as needed for wheezing or shortness of breath., Disp: 1 each, Rfl: 0   atorvastatin  (LIPITOR) 40 MG tablet, Take 40 mg by mouth daily., Disp: , Rfl:    atorvastatin  (LIPITOR) 40 MG tablet, Take 1 tablet (40 mg total) by mouth every evening., Disp: 90 tablet, Rfl: 3   B Complex Vitamins (VITAMIN B COMPLEX PO), Take 5 mLs by mouth., Disp: , Rfl:    blood glucose meter kit and supplies KIT, Dispense based on patient and insurance preference. Use up to four times daily as  directed., Disp: 1 each, Rfl: 0   calcium  carbonate (TUMS) 500 MG chewable tablet, Chew 1 tablet (200 mg of elemental calcium  total) by mouth 3 (three) times daily as needed for indigestion or heartburn., Disp: , Rfl:    Cholecalciferol  (VITAMIN D3) 1.25 MG (50000 UT) CAPS, Take 1 capsule by mouth once a week., Disp: , Rfl:    Clobetasol Prop Emollient Base 0.05 % emollient cream, Apply 1 Application topically 2 (two) times daily., Disp: , Rfl:    Docusate Sodium (DSS) 100 MG CAPS, Take 100 mg by mouth., Disp: , Rfl:    fluticasone (FLONASE) 50 MCG/ACT nasal spray, Place 1 spray into the nose daily as needed., Disp: , Rfl:    glucose blood (CONTOUR NEXT TEST) test strip, Use as instructed four times a day as needed, Disp: 100 each, Rfl: 0   ibuprofen  (ADVIL ) 800 MG tablet, Take 1 tablet (800 mg total) by mouth daily as needed for cramping, moderate pain (pain score 4-6) or fever. (Patient not taking: Reported on 01/25/2024), Disp: , Rfl:    montelukast  (SINGULAIR ) 10 MG tablet, Take 1 tablet (10 mg total) by mouth at bedtime., Disp: 90 tablet, Rfl: 3   omeprazole  (PRILOSEC) 20 MG capsule, Take 1 capsule (20 mg total) by mouth daily for 14 days., Disp: 90 capsule, Rfl:    OZEMPIC , 1 MG/DOSE, 4 MG/3ML SOPN, Inject 1 mg into the skin once a week., Disp: , Rfl:    Semaglutide , 1 MG/DOSE, (OZEMPIC , 1  MG/DOSE,) 4 MG/3ML SOPN, Inject 1 mg into the skin every 7 (seven) days., Disp: 9 mL, Rfl: 0   sodium chloride (OCEAN) 0.65 % SOLN nasal spray, Place 2 sprays into both nostrils every 2 (two) hours while awake., Disp: 104 mL, Rfl: 0   triamcinolone  cream (KENALOG ) 0.1 %, APPLY TOPICALLY TO THE AFFECTED AREA TWICE DAILY (Patient not taking: Reported on 01/25/2024), Disp: 30 g, Rfl: 0   Allergies  Allergen Reactions   Onion     Past Medical History:  Diagnosis Date   Allergy    DVT (deep venous thrombosis) (HCC)    Peripheral vascular disease      Past Surgical History:  Procedure Laterality Date    ANAL FISTULECTOMY     ENDOVENOUS ABLATION SAPHENOUS VEIN W/ LASER Right 10/25/2019   endovenous laser ablation right greater saphenous vein by Carlin Haddock MD    FINGER SURGERY      Family History  Problem Relation Age of Onset   Diabetes Mother    Cancer Father    Breast cancer Maternal Aunt     Social History   Tobacco Use   Smoking status: Never    Passive exposure: Past (mother and father smoked as baby and mother only 83-49 years old)   Smokeless tobacco: Never  Vaping Use   Vaping status: Never Used  Substance Use Topics   Alcohol use: Yes    Comment: 1-2 times a month   Drug use: No    ROS Refer to HPI for ROS details.  Objective:   Vitals: BP 117/82 (BP Location: Left Arm)   Pulse 72   Temp 97.9 F (36.6 C) (Temporal)   Resp 20   Ht 5' 1 (1.549 m)   Wt 230 lb (104.3 kg)   LMP 06/05/2024 (Approximate)   SpO2 96%   BMI 43.46 kg/m   Physical Exam Vitals and nursing note reviewed.  Constitutional:      General: She is not in acute distress.    Appearance: Normal appearance. She is well-developed. She is not ill-appearing or toxic-appearing.  HENT:     Head: Normocephalic and atraumatic.     Nose: Congestion present. No rhinorrhea.     Mouth/Throat:     Mouth: Mucous membranes are moist.     Pharynx: Oropharynx is clear. Posterior oropharyngeal erythema present. No oropharyngeal exudate.  Eyes:     Extraocular Movements: Extraocular movements intact.     Conjunctiva/sclera: Conjunctivae normal.  Cardiovascular:     Rate and Rhythm: Normal rate and regular rhythm.     Heart sounds: Normal heart sounds. No murmur heard. Pulmonary:     Effort: Pulmonary effort is normal. No respiratory distress.     Breath sounds: Normal breath sounds. No stridor. No wheezing, rhonchi or rales.  Chest:     Chest wall: No tenderness.  Skin:    General: Skin is warm and dry.  Neurological:     General: No focal deficit present.     Mental Status: She is alert and  oriented to person, place, and time.  Psychiatric:        Mood and Affect: Mood normal.        Behavior: Behavior normal.     Procedures  No results found for this or any previous visit (from the past 24 hours).  DG Chest 2 View Result Date: 06/17/2024 CLINICAL DATA:  Productive cough and sore throat. EXAM: CHEST - 2 VIEW COMPARISON:  None Available. FINDINGS: The heart size and  mediastinal contours are within normal limits. Low lung volumes are noted. No acute infiltrate, pleural effusion or pneumothorax is identified. The visualized skeletal structures are unremarkable. IMPRESSION: No active cardiopulmonary disease. Electronically Signed   By: Suzen Dials M.D.   On: 06/17/2024 13:53     Assessment and Plan :     Discharge Instructions       1. Acute viral syndrome (Primary) - DG Chest 2 View x-ray performed in UC shows no acute cardiopulmonary processes, no sign of consolidation or pneumonia. - azelastine (ASTELIN) 0.1 % nasal spray; Place 1 spray into both nostrils 2 (two) times daily. Use in each nostril as directed  Dispense: 30 mL; Refill: 1 - predniSONE (DELTASONE) 20 MG tablet; Take 2 tablets (40 mg total) by mouth daily for 5 days.  Dispense: 10 tablet; Refill: 0 -Continue to monitor symptoms for any change in severity if there is any escalation of current symptoms or development of new symptoms follow-up in ER for further evaluation and management.       Brodi Nery B Delainee Tramel   Shantae Vantol, [place], [place] 06/17/24 1359

## 2024-06-17 NOTE — Discharge Instructions (Signed)
  1. Acute viral syndrome (Primary) - DG Chest 2 View x-ray performed in UC shows no acute cardiopulmonary processes, no sign of consolidation or pneumonia. - azelastine (ASTELIN) 0.1 % nasal spray; Place 1 spray into both nostrils 2 (two) times daily. Use in each nostril as directed  Dispense: 30 mL; Refill: 1 - predniSONE (DELTASONE) 20 MG tablet; Take 2 tablets (40 mg total) by mouth daily for 5 days.  Dispense: 10 tablet; Refill: 0 -Continue to monitor symptoms for any change in severity if there is any escalation of current symptoms or development of new symptoms follow-up in ER for further evaluation and management.

## 2024-06-19 ENCOUNTER — Other Ambulatory Visit (HOSPITAL_BASED_OUTPATIENT_CLINIC_OR_DEPARTMENT_OTHER): Payer: Self-pay

## 2024-06-21 ENCOUNTER — Other Ambulatory Visit (HOSPITAL_BASED_OUTPATIENT_CLINIC_OR_DEPARTMENT_OTHER): Payer: Self-pay

## 2024-06-26 ENCOUNTER — Other Ambulatory Visit (HOSPITAL_BASED_OUTPATIENT_CLINIC_OR_DEPARTMENT_OTHER): Payer: Self-pay
